# Patient Record
Sex: Male | Born: 1955 | Race: Black or African American | Hispanic: No | Marital: Single | Smoking: Never smoker
Health system: Southern US, Community
[De-identification: ages and names within clinical notes are randomized; demographics above are authoritative.]

## PROBLEM LIST (undated history)

## (undated) DIAGNOSIS — M1990 Unspecified osteoarthritis, unspecified site: Secondary | ICD-10-CM

## (undated) DIAGNOSIS — I10 Essential (primary) hypertension: Secondary | ICD-10-CM

## (undated) HISTORY — PX: JOINT REPLACEMENT: SHX530

---

## 2017-10-31 ENCOUNTER — Inpatient Hospital Stay: Payer: Medicare Other

## 2017-10-31 ENCOUNTER — Emergency Department: Payer: Medicare Other | Admitting: Anesthesiology

## 2017-10-31 ENCOUNTER — Encounter: Admission: EM | Disposition: E | Payer: Self-pay | Source: Home / Self Care | Attending: Internal Medicine

## 2017-10-31 ENCOUNTER — Inpatient Hospital Stay
Admission: EM | Admit: 2017-10-31 | Discharge: 2017-11-24 | DRG: 915 | Disposition: E | Payer: Medicare Other | Attending: Internal Medicine | Admitting: Internal Medicine

## 2017-10-31 ENCOUNTER — Other Ambulatory Visit: Payer: Self-pay

## 2017-10-31 DIAGNOSIS — E872 Acidosis: Secondary | ICD-10-CM | POA: Diagnosis not present

## 2017-10-31 DIAGNOSIS — N281 Cyst of kidney, acquired: Secondary | ICD-10-CM | POA: Diagnosis present

## 2017-10-31 DIAGNOSIS — I10 Essential (primary) hypertension: Secondary | ICD-10-CM | POA: Diagnosis present

## 2017-10-31 DIAGNOSIS — R34 Anuria and oliguria: Secondary | ICD-10-CM | POA: Diagnosis not present

## 2017-10-31 DIAGNOSIS — G9340 Encephalopathy, unspecified: Secondary | ICD-10-CM | POA: Diagnosis not present

## 2017-10-31 DIAGNOSIS — X58XXXA Exposure to other specified factors, initial encounter: Secondary | ICD-10-CM | POA: Diagnosis present

## 2017-10-31 DIAGNOSIS — R6521 Severe sepsis with septic shock: Secondary | ICD-10-CM | POA: Diagnosis not present

## 2017-10-31 DIAGNOSIS — J948 Other specified pleural conditions: Secondary | ICD-10-CM

## 2017-10-31 DIAGNOSIS — M545 Low back pain: Secondary | ICD-10-CM | POA: Diagnosis not present

## 2017-10-31 DIAGNOSIS — J9600 Acute respiratory failure, unspecified whether with hypoxia or hypercapnia: Secondary | ICD-10-CM | POA: Diagnosis not present

## 2017-10-31 DIAGNOSIS — R4182 Altered mental status, unspecified: Secondary | ICD-10-CM

## 2017-10-31 DIAGNOSIS — R569 Unspecified convulsions: Secondary | ICD-10-CM | POA: Diagnosis not present

## 2017-10-31 DIAGNOSIS — Z4659 Encounter for fitting and adjustment of other gastrointestinal appliance and device: Secondary | ICD-10-CM

## 2017-10-31 DIAGNOSIS — Z66 Do not resuscitate: Secondary | ICD-10-CM | POA: Diagnosis present

## 2017-10-31 DIAGNOSIS — Z960 Presence of urogenital implants: Secondary | ICD-10-CM

## 2017-10-31 DIAGNOSIS — Z888 Allergy status to other drugs, medicaments and biological substances status: Secondary | ICD-10-CM

## 2017-10-31 DIAGNOSIS — E8770 Fluid overload, unspecified: Secondary | ICD-10-CM | POA: Diagnosis not present

## 2017-10-31 DIAGNOSIS — J851 Abscess of lung with pneumonia: Secondary | ICD-10-CM | POA: Diagnosis not present

## 2017-10-31 DIAGNOSIS — J15211 Pneumonia due to Methicillin susceptible Staphylococcus aureus: Secondary | ICD-10-CM | POA: Diagnosis not present

## 2017-10-31 DIAGNOSIS — R402434 Glasgow coma scale score 3-8, 24 hours or more after hospital admission: Secondary | ICD-10-CM | POA: Diagnosis not present

## 2017-10-31 DIAGNOSIS — Z452 Encounter for adjustment and management of vascular access device: Secondary | ICD-10-CM

## 2017-10-31 DIAGNOSIS — E876 Hypokalemia: Secondary | ICD-10-CM | POA: Diagnosis not present

## 2017-10-31 DIAGNOSIS — I639 Cerebral infarction, unspecified: Secondary | ICD-10-CM | POA: Diagnosis not present

## 2017-10-31 DIAGNOSIS — T464X5A Adverse effect of angiotensin-converting-enzyme inhibitors, initial encounter: Secondary | ICD-10-CM | POA: Diagnosis present

## 2017-10-31 DIAGNOSIS — N170 Acute kidney failure with tubular necrosis: Secondary | ICD-10-CM | POA: Diagnosis not present

## 2017-10-31 DIAGNOSIS — J9601 Acute respiratory failure with hypoxia: Secondary | ICD-10-CM | POA: Diagnosis present

## 2017-10-31 DIAGNOSIS — R0603 Acute respiratory distress: Secondary | ICD-10-CM

## 2017-10-31 DIAGNOSIS — J189 Pneumonia, unspecified organism: Secondary | ICD-10-CM | POA: Diagnosis not present

## 2017-10-31 DIAGNOSIS — J154 Pneumonia due to other streptococci: Secondary | ICD-10-CM | POA: Diagnosis not present

## 2017-10-31 DIAGNOSIS — M1990 Unspecified osteoarthritis, unspecified site: Secondary | ICD-10-CM | POA: Diagnosis present

## 2017-10-31 DIAGNOSIS — T783XXA Angioneurotic edema, initial encounter: Principal | ICD-10-CM | POA: Diagnosis present

## 2017-10-31 DIAGNOSIS — A419 Sepsis, unspecified organism: Secondary | ICD-10-CM | POA: Diagnosis not present

## 2017-10-31 DIAGNOSIS — R1110 Vomiting, unspecified: Secondary | ICD-10-CM | POA: Diagnosis not present

## 2017-10-31 DIAGNOSIS — J1520 Pneumonia due to staphylococcus, unspecified: Secondary | ICD-10-CM | POA: Diagnosis not present

## 2017-10-31 DIAGNOSIS — E870 Hyperosmolality and hypernatremia: Secondary | ICD-10-CM | POA: Diagnosis not present

## 2017-10-31 DIAGNOSIS — Z0189 Encounter for other specified special examinations: Secondary | ICD-10-CM

## 2017-10-31 DIAGNOSIS — J90 Pleural effusion, not elsewhere classified: Secondary | ICD-10-CM | POA: Diagnosis not present

## 2017-10-31 DIAGNOSIS — Z7189 Other specified counseling: Secondary | ICD-10-CM | POA: Diagnosis not present

## 2017-10-31 DIAGNOSIS — Z9911 Dependence on respirator [ventilator] status: Secondary | ICD-10-CM

## 2017-10-31 DIAGNOSIS — Z01818 Encounter for other preprocedural examination: Secondary | ICD-10-CM

## 2017-10-31 DIAGNOSIS — N179 Acute kidney failure, unspecified: Secondary | ICD-10-CM | POA: Diagnosis not present

## 2017-10-31 DIAGNOSIS — Z978 Presence of other specified devices: Secondary | ICD-10-CM

## 2017-10-31 DIAGNOSIS — Z515 Encounter for palliative care: Secondary | ICD-10-CM | POA: Diagnosis present

## 2017-10-31 DIAGNOSIS — J9690 Respiratory failure, unspecified, unspecified whether with hypoxia or hypercapnia: Secondary | ICD-10-CM

## 2017-10-31 DIAGNOSIS — I471 Supraventricular tachycardia: Secondary | ICD-10-CM | POA: Diagnosis not present

## 2017-10-31 DIAGNOSIS — R1310 Dysphagia, unspecified: Secondary | ICD-10-CM | POA: Diagnosis not present

## 2017-10-31 DIAGNOSIS — K769 Liver disease, unspecified: Secondary | ICD-10-CM | POA: Diagnosis present

## 2017-10-31 DIAGNOSIS — I6340 Cerebral infarction due to embolism of unspecified cerebral artery: Secondary | ICD-10-CM | POA: Diagnosis not present

## 2017-10-31 DIAGNOSIS — F10231 Alcohol dependence with withdrawal delirium: Secondary | ICD-10-CM | POA: Diagnosis not present

## 2017-10-31 DIAGNOSIS — D649 Anemia, unspecified: Secondary | ICD-10-CM | POA: Diagnosis not present

## 2017-10-31 DIAGNOSIS — K219 Gastro-esophageal reflux disease without esophagitis: Secondary | ICD-10-CM | POA: Diagnosis present

## 2017-10-31 DIAGNOSIS — Z1389 Encounter for screening for other disorder: Secondary | ICD-10-CM

## 2017-10-31 DIAGNOSIS — R7989 Other specified abnormal findings of blood chemistry: Secondary | ICD-10-CM | POA: Diagnosis not present

## 2017-10-31 DIAGNOSIS — R739 Hyperglycemia, unspecified: Secondary | ICD-10-CM | POA: Diagnosis not present

## 2017-10-31 DIAGNOSIS — E878 Other disorders of electrolyte and fluid balance, not elsewhere classified: Secondary | ICD-10-CM | POA: Diagnosis not present

## 2017-10-31 DIAGNOSIS — J869 Pyothorax without fistula: Secondary | ICD-10-CM | POA: Diagnosis not present

## 2017-10-31 DIAGNOSIS — T783XXD Angioneurotic edema, subsequent encounter: Secondary | ICD-10-CM | POA: Diagnosis not present

## 2017-10-31 DIAGNOSIS — D696 Thrombocytopenia, unspecified: Secondary | ICD-10-CM | POA: Diagnosis not present

## 2017-10-31 HISTORY — PX: INTUBATION-ENDOTRACHEAL WITH TRACHEOSTOMY STANDBY: SHX6592

## 2017-10-31 HISTORY — DX: Essential (primary) hypertension: I10

## 2017-10-31 HISTORY — DX: Unspecified osteoarthritis, unspecified site: M19.90

## 2017-10-31 LAB — CBC
HCT: 41.4 % (ref 40.0–52.0)
Hemoglobin: 14.6 g/dL (ref 13.0–18.0)
MCH: 32.4 pg (ref 26.0–34.0)
MCHC: 35.2 g/dL (ref 32.0–36.0)
MCV: 91.9 fL (ref 80.0–100.0)
PLATELETS: 216 10*3/uL (ref 150–440)
RBC: 4.5 MIL/uL (ref 4.40–5.90)
RDW: 13.3 % (ref 11.5–14.5)
WBC: 5.7 10*3/uL (ref 3.8–10.6)

## 2017-10-31 LAB — COMPREHENSIVE METABOLIC PANEL
ALK PHOS: 69 U/L (ref 38–126)
ALT: 37 U/L (ref 17–63)
AST: 45 U/L — AB (ref 15–41)
Albumin: 4.7 g/dL (ref 3.5–5.0)
Anion gap: 14 (ref 5–15)
BUN: 15 mg/dL (ref 6–20)
CALCIUM: 9.3 mg/dL (ref 8.9–10.3)
CHLORIDE: 98 mmol/L — AB (ref 101–111)
CO2: 26 mmol/L (ref 22–32)
CREATININE: 0.99 mg/dL (ref 0.61–1.24)
Glucose, Bld: 85 mg/dL (ref 65–99)
Potassium: 2.7 mmol/L — CL (ref 3.5–5.1)
Sodium: 138 mmol/L (ref 135–145)
Total Bilirubin: 0.9 mg/dL (ref 0.3–1.2)
Total Protein: 8.4 g/dL — ABNORMAL HIGH (ref 6.5–8.1)

## 2017-10-31 LAB — GLUCOSE, CAPILLARY: Glucose-Capillary: 102 mg/dL — ABNORMAL HIGH (ref 65–99)

## 2017-10-31 LAB — MAGNESIUM: MAGNESIUM: 1.7 mg/dL (ref 1.7–2.4)

## 2017-10-31 LAB — PHOSPHORUS: Phosphorus: 3.1 mg/dL (ref 2.5–4.6)

## 2017-10-31 SURGERY — INTUBATION-ENDOTRACHEAL WITH TRACHEOSTOMY STANDBY
Anesthesia: Monitor Anesthesia Care | Site: Nose | Wound class: Clean Contaminated

## 2017-10-31 MED ORDER — ACETAMINOPHEN 325 MG PO TABS: 650.0000 mg | ORAL_TABLET | ORAL | Status: DC | PRN

## 2017-10-31 MED ORDER — OXYMETAZOLINE HCL 0.05 % NA SOLN
NASAL | Status: AC
  Filled 2017-10-31: qty 15

## 2017-10-31 MED ORDER — MIDAZOLAM HCL 2 MG/2ML IJ SOLN
2.0000 mg | INTRAMUSCULAR | Status: DC | PRN
  Administered 2017-11-01 (×2): 4 mg via INTRAVENOUS
  Filled 2017-10-31 (×2): qty 4
  Filled 2017-10-31: qty 2

## 2017-10-31 MED ORDER — DEXAMETHASONE SODIUM PHOSPHATE 4 MG/ML IJ SOLN
4.0000 mg | Freq: Four times a day (QID) | INTRAMUSCULAR | Status: DC
  Administered 2017-11-01 (×2): 4 mg via INTRAVENOUS
  Filled 2017-10-31 (×2): qty 1

## 2017-10-31 MED ORDER — VECURONIUM BROMIDE 10 MG IV SOLR
10.0000 mg | INTRAVENOUS | Status: DC | PRN
  Administered 2017-10-31 – 2017-11-01 (×3): 10 mg via INTRAVENOUS
  Filled 2017-10-31 (×2): qty 10

## 2017-10-31 MED ORDER — EPINEPHRINE 0.3 MG/0.3ML IJ SOAJ
INTRAMUSCULAR | Status: AC
  Administered 2017-10-31: 0.3 mg
  Filled 2017-10-31: qty 0.3

## 2017-10-31 MED ORDER — MIDAZOLAM HCL 2 MG/2ML IJ SOLN
INTRAMUSCULAR | Status: AC
  Filled 2017-10-31: qty 2

## 2017-10-31 MED ORDER — METHYLPREDNISOLONE SODIUM SUCC 125 MG IJ SOLR
INTRAMUSCULAR | Status: AC
  Administered 2017-10-31: 125 mg
  Filled 2017-10-31: qty 2

## 2017-10-31 MED ORDER — DEXMEDETOMIDINE HCL IN NACL 200 MCG/50ML IV SOLN
INTRAVENOUS | Status: AC
  Filled 2017-10-31: qty 50

## 2017-10-31 MED ORDER — HYDRALAZINE HCL 20 MG/ML IJ SOLN
10.0000 mg | INTRAMUSCULAR | Status: DC | PRN
  Administered 2017-11-01 (×3): 20 mg via INTRAVENOUS
  Administered 2017-11-03: 10 mg via INTRAVENOUS
  Administered 2017-11-04 (×2): 20 mg via INTRAVENOUS
  Administered 2017-11-04 – 2017-11-05 (×5): 40 mg via INTRAVENOUS
  Administered 2017-11-07: 20 mg via INTRAVENOUS
  Administered 2017-11-07: 40 mg via INTRAVENOUS
  Filled 2017-10-31 (×2): qty 2
  Filled 2017-10-31: qty 1
  Filled 2017-10-31: qty 2
  Filled 2017-10-31: qty 1
  Filled 2017-10-31 (×3): qty 2
  Filled 2017-10-31 (×2): qty 1
  Filled 2017-10-31: qty 2
  Filled 2017-10-31 (×2): qty 1

## 2017-10-31 MED ORDER — FAMOTIDINE IN NACL 20-0.9 MG/50ML-% IV SOLN
20.0000 mg | Freq: Two times a day (BID) | INTRAVENOUS | Status: DC
  Administered 2017-11-01: 20 mg via INTRAVENOUS
  Filled 2017-10-31: qty 50

## 2017-10-31 MED ORDER — MIDAZOLAM HCL 2 MG/2ML IJ SOLN
2.0000 mg | INTRAMUSCULAR | Status: DC | PRN
  Filled 2017-10-31: qty 2

## 2017-10-31 MED ORDER — SODIUM CHLORIDE 0.9 % IV SOLN
Freq: Once | INTRAVENOUS | Status: AC
  Administered 2017-10-31: 22:00:00 via INTRAVENOUS

## 2017-10-31 MED ORDER — FAMOTIDINE IN NACL 20-0.9 MG/50ML-% IV SOLN
INTRAVENOUS | Status: AC
  Administered 2017-10-31: 20 mg
  Filled 2017-10-31: qty 50

## 2017-10-31 MED ORDER — CHLORHEXIDINE GLUCONATE 0.12% ORAL RINSE (MEDLINE KIT)
15.0000 mL | Freq: Two times a day (BID) | OROMUCOSAL | Status: DC
  Administered 2017-11-01 – 2017-11-16 (×30): 15 mL via OROMUCOSAL

## 2017-10-31 MED ORDER — DIPHENHYDRAMINE HCL 50 MG/ML IJ SOLN
INTRAMUSCULAR | Status: AC
  Administered 2017-10-31: 50 mg
  Filled 2017-10-31: qty 1

## 2017-10-31 MED ORDER — PROPOFOL 10 MG/ML IV BOLUS
INTRAVENOUS | Status: DC | PRN
  Administered 2017-10-31: 100 mg via INTRAVENOUS

## 2017-10-31 MED ORDER — LIDOCAINE HCL (PF) 4 % IJ SOLN
INTRAMUSCULAR | Status: DC | PRN
  Administered 2017-10-31: 4 mL via RESPIRATORY_TRACT

## 2017-10-31 MED ORDER — DEXMEDETOMIDINE HCL IN NACL 400 MCG/100ML IV SOLN
INTRAVENOUS | Status: DC | PRN
  Administered 2017-10-31: .4 ug/kg/h via INTRAVENOUS

## 2017-10-31 MED ORDER — BISACODYL 10 MG RE SUPP: 10.0000 mg | Freq: Every day | RECTAL | Status: DC | PRN

## 2017-10-31 MED ORDER — MIDAZOLAM HCL 2 MG/2ML IJ SOLN
2.0000 mg | INTRAMUSCULAR | Status: DC | PRN
  Administered 2017-10-31: 2 mg via INTRAVENOUS

## 2017-10-31 MED ORDER — MIDAZOLAM HCL 2 MG/2ML IJ SOLN
INTRAMUSCULAR | Status: DC | PRN
  Administered 2017-10-31 (×2): 2 mg via INTRAVENOUS

## 2017-10-31 MED ORDER — ROCURONIUM BROMIDE 50 MG/5ML IV SOLN: 1.0000 mg/kg | Freq: Once | INTRAVENOUS | Status: DC

## 2017-10-31 MED ORDER — DIPHENHYDRAMINE HCL 50 MG/ML IJ SOLN
25.0000 mg | Freq: Four times a day (QID) | INTRAMUSCULAR | Status: DC | PRN
  Administered 2017-11-01: 25 mg via INTRAVENOUS
  Filled 2017-10-31: qty 1

## 2017-10-31 MED ORDER — GLYCOPYRROLATE 0.2 MG/ML IJ SOLN
INTRAMUSCULAR | Status: DC | PRN
  Administered 2017-10-31: 0.4 mg via INTRAVENOUS

## 2017-10-31 MED ORDER — OXYMETAZOLINE HCL 0.05 % NA SOLN
2.0000 | Freq: Once | NASAL | Status: AC
  Administered 2017-10-31: 2 via NASAL

## 2017-10-31 MED ORDER — ENOXAPARIN SODIUM 40 MG/0.4ML ~~LOC~~ SOLN: 40.0000 mg | SUBCUTANEOUS | Status: DC

## 2017-10-31 MED ORDER — ONDANSETRON HCL 4 MG/2ML IJ SOLN
4.0000 mg | Freq: Four times a day (QID) | INTRAMUSCULAR | Status: DC | PRN
  Administered 2017-11-06: 4 mg via INTRAVENOUS
  Filled 2017-10-31: qty 2

## 2017-10-31 MED ORDER — ASPIRIN 300 MG RE SUPP: 300.0000 mg | RECTAL | Status: AC

## 2017-10-31 MED ORDER — ETOMIDATE 2 MG/ML IV SOLN: 0.3000 mg/kg | Freq: Once | INTRAVENOUS | Status: DC

## 2017-10-31 MED ORDER — ORAL CARE MOUTH RINSE: 15.0000 mL | Freq: Four times a day (QID) | OROMUCOSAL | Status: DC

## 2017-10-31 MED ORDER — LIDOCAINE-EPINEPHRINE 1 %-1:100000 IJ SOLN
INTRAMUSCULAR | Status: AC
  Filled 2017-10-31: qty 1

## 2017-10-31 MED ORDER — LACTATED RINGERS IV SOLN
INTRAVENOUS | Status: DC | PRN
  Administered 2017-10-31: 23:00:00 via INTRAVENOUS

## 2017-10-31 MED ORDER — SENNOSIDES 8.8 MG/5ML PO SYRP
5.0000 mL | ORAL_SOLUTION | Freq: Two times a day (BID) | ORAL | Status: DC | PRN
  Administered 2017-11-06: 5 mL
  Filled 2017-10-31 (×2): qty 5

## 2017-10-31 MED ORDER — ASPIRIN 81 MG PO CHEW: 324.0000 mg | CHEWABLE_TABLET | ORAL | Status: AC

## 2017-10-31 MED ORDER — ROCURONIUM BROMIDE 100 MG/10ML IV SOLN
INTRAVENOUS | Status: DC | PRN
  Administered 2017-10-31: 50 mg via INTRAVENOUS

## 2017-10-31 MED ORDER — PROPOFOL 1000 MG/100ML IV EMUL
5.0000 ug/kg/min | INTRAVENOUS | Status: DC
  Administered 2017-10-31: 30 ug/kg/min via INTRAVENOUS
  Administered 2017-11-01 (×3): 50 ug/kg/min via INTRAVENOUS
  Filled 2017-10-31 (×6): qty 100

## 2017-10-31 MED ORDER — FAMOTIDINE IN NACL 20-0.9 MG/50ML-% IV SOLN: 20.0000 mg | Freq: Two times a day (BID) | INTRAVENOUS | Status: DC

## 2017-10-31 MED ORDER — SODIUM CHLORIDE 0.9 % IV SOLN: 250.0000 mL | INTRAVENOUS | Status: DC | PRN

## 2017-10-31 MED ORDER — DEXMEDETOMIDINE HCL 200 MCG/2ML IV SOLN
INTRAVENOUS | Status: DC | PRN
  Administered 2017-10-31: 8 ug via INTRAVENOUS
  Administered 2017-10-31: 12 ug via INTRAVENOUS

## 2017-10-31 MED ORDER — MIDAZOLAM HCL 2 MG/2ML IJ SOLN
INTRAMUSCULAR | Status: AC
Start: 2017-10-31 — End: ?
  Filled 2017-10-31: qty 2

## 2017-10-31 MED ORDER — IPRATROPIUM-ALBUTEROL 0.5-2.5 (3) MG/3ML IN SOLN
3.0000 mL | Freq: Four times a day (QID) | RESPIRATORY_TRACT | Status: DC
  Administered 2017-11-01 (×2): 3 mL via RESPIRATORY_TRACT
  Filled 2017-10-31 (×2): qty 3

## 2017-10-31 MED ORDER — MIDAZOLAM HCL 2 MG/2ML IJ SOLN
2.0000 mg | INTRAMUSCULAR | Status: DC | PRN
  Administered 2017-11-01: 4 mg via INTRAVENOUS
  Administered 2017-11-01: 2 mg via INTRAVENOUS
  Filled 2017-10-31 (×2): qty 4

## 2017-10-31 SURGICAL SUPPLY — 12 items
BLADE SURG 15 STRL LF DISP TIS (BLADE) ×1 IMPLANT
BLADE SURG 15 STRL SS (BLADE) ×2
BLADE SURG SZ11 CARB STEEL (BLADE) ×3 IMPLANT
CANISTER SUCT 1200ML W/VALVE (MISCELLANEOUS) ×3 IMPLANT
ELECT REM PT RETURN 9FT ADLT (ELECTROSURGICAL) ×3
ELECTRODE REM PT RTRN 9FT ADLT (ELECTROSURGICAL) ×1 IMPLANT
GLOVE BIO SURGEON STRL SZ7.5 (GLOVE) ×3 IMPLANT
GOWN STRL REUS W/ TWL LRG LVL3 (GOWN DISPOSABLE) ×2 IMPLANT
GOWN STRL REUS W/TWL LRG LVL3 (GOWN DISPOSABLE) ×4
KIT TURNOVER KIT A (KITS) ×3 IMPLANT
PACK HEAD/NECK (MISCELLANEOUS) ×3 IMPLANT
SPONGE DRAIN TRACH 4X4 STRL 2S (GAUZE/BANDAGES/DRESSINGS) ×3 IMPLANT

## 2017-10-31 NOTE — ED Notes (Signed)
First nurse to call family member Dewayne Hatchnn to notify her of possibility of intubation to save airway.

## 2017-10-31 NOTE — ED Notes (Signed)
Pt place don 4 L of O2. 

## 2017-10-31 NOTE — Consult Note (Signed)
PULMONARY / CRITICAL CARE MEDICINE   Name: Dustin Orozco MRN: [id number] DOB: 1955-08-24    ADMISSION DATE:  11-07-2017   CONSULTATION DATE:  07-Nov-2017  REFERRING MD:  Dr. Anne Hahn  Reason: Acute respiratory failure secondary to angioedema  HISTORY OF PRESENT ILLNESS:   This is a 62 year old African-American male admitted with acute angioedema secondary to lisinopril.  She is obtained from the ED records as patient is currently intubated and sedated.  Swelling started today and gradually got worse as the course of the day.  Upon ED presentation position stone was swollen and obstructing his airway hence he was taken to the OR for an emergent intubation.  He was seen by ENT and nasally intubated.  Patient had been taking lisinopril for years.  PAST MEDICAL HISTORY :  He  has a past medical history of Arthritis and Hypertension.  PAST SURGICAL HISTORY: He  has a past surgical history that includes Joint replacement.  Allergies  Allergen Reactions  . Lisinopril     Tongue swelling    No current facility-administered medications on file prior to encounter.    No current outpatient medications on file prior to encounter.    FAMILY HISTORY:  His has no family status information on file.    SOCIAL HISTORY: He  reports that he has never smoked. He has never used smokeless tobacco. He reports that he drinks alcohol. He reports that he has current or past drug history.  REVIEW OF SYSTEMS:   Unable to obtain as patient is currently sedated and intubated  SUBJECTIVE:   VITAL SIGNS: BP (!) 165/109   Pulse 73   Temp 97.7 F (36.5 C) (Oral)   Resp 17   Ht 6\' 1"  (1.854 m)   Wt 198 lb (89.8 kg)   SpO2 100%   BMI 26.12 kg/m   HEMODYNAMICS:    VENTILATOR SETTINGS:    INTAKE / OUTPUT: No intake/output data recorded.  PHYSICAL EXAMINATION: General: Sedated Neuro: Withdraws to pain HEENT: PERRLA, nasotracheal tube in place, tongue is swollen and protruding out of the  mouth Cardiovascular: Apical pulse regular, S1-S2, no murmur regurg or gallop, +2 pulses bilaterally, no edema Lungs:  CTAB Abdomen: Nondistended, normal bowel sounds in all 4 quadrants, palpation reveals no organomegaly Musculoskeletal: Positive range of motion, no deformities Skin: Warm and dry  LABS:  BMET No results for input(s): NA, K, CL, CO2, BUN, CREATININE, GLUCOSE in the last 168 hours.  Electrolytes No results for input(s): CALCIUM, MG, PHOS in the last 168 hours.  CBC No results for input(s): WBC, HGB, HCT, PLT in the last 168 hours.  Coag's No results for input(s): APTT, INR in the last 168 hours.  Sepsis Markers No results for input(s): LATICACIDVEN, PROCALCITON, O2SATVEN in the last 168 hours.  ABG No results for input(s): PHART, PCO2ART, PO2ART in the last 168 hours.  Liver Enzymes No results for input(s): AST, ALT, ALKPHOS, BILITOT, ALBUMIN in the last 168 hours.  Cardiac Enzymes No results for input(s): TROPONINI, PROBNP in the last 168 hours.  Glucose No results for input(s): GLUCAP in the last 168 hours.  Imaging No results found.  SIGNIFICANT EVENTS: 2017-11-07>ADMITTED  LINES/TUBES: PIVs ETT  DISCUSSION: 62 year old male presenting with respiratory failure from airway obstruction secondary to ACE induced angioedema  ASSESSMENT  Acute hypoxic respiratory failure secondary to angioedema Acute airway obstruction secondary to angioedema ACE induced angioedema Hypokalemia History of hypertension  PLAN Hemodynamic monitoring per ICU protocol Full vent support with current settings Chest x-ray and  ABG post intubation reviewed ENT following Versed, as needed vecuronium and propofol for vent sedation  Decadron, Benadryl, and H2 blocker IV fluids Monitor and replace electrolytes GI and DVT prophylaxis  FAMILY  - Updates:   Shaketha Jeon S. [facility] ANP-BC Pulmonary and Critical Care Medicine [facility] Pager [phone] or  [phone]  NB: This document was prepared using Dragon voice recognition software and may include unintentional dictation errors.    11/11/2017, 11:23 PM

## 2017-10-31 NOTE — ED Notes (Signed)
ENT states we need to go to the ER.

## 2017-10-31 NOTE — Op Note (Signed)
11/20/2017  11:01 PM    Dustin Orozco, Dustin Orozco  [id number]   Pre-Op Dx: airway obstruction  Post-op Dx: SAME  Proc: Flexible transnasal fiberoptic bronchoscopy with intubation   Surg:  Davina Pokehapman T Luevenia Mcavoy  Anes:  GOT  EBL:  0  Comp:  None  Findings:  Severe tongue swelling and edema  Procedure: Dustin Orozco was identified in the holding area taken the to the operating room placed in supine position. After coordination with anesthesia each nostril was decongested with Afrin solution. The nares was then sequentially dilated with nasal trumpets and topical viscous lidocaine. With this completed and mild sedation given the patient was placed 45. A #7 nasotracheal tube was placed over the bronchoscope. Bronchoscope was then introduced to the left nostril into the posterior pharynx the larynx could be easily visualized. The scope was then passed through the vocal folds down to the carina. The endotracheal tube was advanced over the bronchoscope into the airway and secured. CO2 was easily obtained there were bilateral breath sounds heard. The patient was then sedated for transport ICU. The tube was secured using tape in a circumferential band.  Dispo:   Good  Plan:  Admit to ICU until angioedema has resolved.  Davina PokeChapman T Yeiden Frenkel  11/11/2017 11:01 PM

## 2017-10-31 NOTE — ED Notes (Deleted)
This

## 2017-10-31 NOTE — ED Provider Notes (Addendum)
[facility] Emergency Department Provider Note   ____________________________________________   I have reviewed the triage vital signs and the nursing notes.   HISTORY  Chief Complaint Allergic Reaction   History limited by: Not Limited   HPI Dustin Orozco is a 62 y.o. male who presents to the emergency department today because of concern for tongue swelling. Patient states that the swelling started today.  He is on lisinopril and has been on it for a number of years.  Since the swelling has started he states that it is continuously and gradually gotten worse.  He does state that he is having associated difficulty with swallowing.  He denies similar symptoms in the past.  Per medical record review patient has a history of HTN  Past Medical History:  Diagnosis Date  . Arthritis   . Hypertension     There are no active problems to display for this patient.   Past Surgical History:  Procedure Laterality Date  . JOINT REPLACEMENT      Prior to Admission medications   Not on File    Allergies Lisinopril  No family history on file.  Social History Social History   Tobacco Use  . Smoking status: Never Smoker  . Smokeless tobacco: Never Used  Substance Use Topics  . Alcohol use: Yes  . 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 ENT: Positive for tongue swelling. Cardiovascular: Denies chest pain. Respiratory: Denies shortness of breath. Gastrointestinal: No abdominal pain.  No nausea, no vomiting.  No diarrhea.   Genitourinary: Negative for dysuria. Musculoskeletal: Negative for back pain. Skin: Negative for rash. Neurological: Negative for headaches, focal weakness or numbness.  ____________________________________________   PHYSICAL EXAM:  VITAL SIGNS: ED Triage Vitals  Enc Vitals Group     BP November 04, 2017 2110 (!) 193/119     Pulse Rate  2017/11/04 2110 86     Resp 2017/11/04 2110 18     Temp 11-04-2017 2110 97.7 F (36.5 C)     Temp Source 2017-11-04 2110 Oral     SpO2 --      Weight Nov 04, 2017 2113 198 lb (89.8 kg)     Height 2017/11/04 2113 6\' 1"  (1.854 m)     Head Circumference --      Peak Flow --      Pain Score 11-04-2017 2113 0   Constitutional: Alert and oriented.  Eyes: Conjunctivae are normal.  ENT   Head: Normocephalic and atraumatic.   Nose: No congestion/rhinnorhea.   Mouth/Throat: Enlarged tongue, primarily the right side. Left side of tongue soft. No lip swelling.    Neck: No stridor. Hematological/Lymphatic/Immunilogical: No cervical lymphadenopathy. Cardiovascular: Normal rate, regular rhythm.  No murmurs, rubs, or gallops.  Respiratory: Normal respiratory effort without tachypnea nor retractions. Breath sounds are clear and equal bilaterally. No wheezes/rales/rhonchi. Gastrointestinal: Soft and non tender. No rebound. No guarding.  Genitourinary: Deferred Musculoskeletal: Normal range of motion in all extremities. Neurologic:  Normal speech and language. No gross focal neurologic deficits are appreciated.  Skin:  Skin is warm, dry and intact. No rash noted. Psychiatric: Mood and affect are normal. Speech and behavior are normal. Patient exhibits appropriate insight and judgment.  ____________________________________________    LABS (pertinent positives/negatives)  None  ____________________________________________   EKG  I, Phineas Semen, attending physician, personally viewed and interpreted this EKG  EKG Time: 2135 Rate: 74 Rhythm: sinus rhythm Axis: left axis  deviation Intervals: qtc 466 QRS: IVCD ST changes: no st elevation Impression: abnormal ekg   ____________________________________________    RADIOLOGY  None  ____________________________________________   PROCEDURES  Procedures  CRITICAL CARE Performed by: Phineas SemenGOODMAN, Santino Kinsella   Total critical care time:  30 minutes  Critical care time was exclusive of separately billable procedures and treating other patients.  Critical care was necessary to treat or prevent imminent or life-threatening deterioration.  Critical care was time spent personally by me on the following activities: development of treatment plan with patient and/or surrogate as well as nursing, discussions with consultants, evaluation of patient's response to treatment, examination of patient, obtaining history from patient or surrogate, ordering and performing treatments and interventions, ordering and review of laboratory studies, ordering and review of radiographic studies, pulse oximetry and re-evaluation of patient's condition.  ____________________________________________   INITIAL IMPRESSION / ASSESSMENT AND PLAN / ED COURSE  Pertinent labs & imaging results that were available during my care of the patient were reviewed by me and considered in my medical decision making (see chart for details).  Patient presented to the emergency department today because of concerns for tongue swelling that started a few hours ago.  On initial exam patient did have some swelling to the right side of his tongue.  Left side of the tongue had remained soft.  Patient does take lisinopril.  Think likely the angioedema is secondary to the lisinopril use.  Patient was initially given Benadryl Pepcid and Solu-Medrol.  However the tongue swelling continued to get worse and started progressing. The patient was then given epi. Intubation equipment was assembled in the event that bedside intubation would be necessary. At this time I contacted Dr. Jenne CampusMcQueen with ENT who evaluated the patient and took the patient to the OR for intubation.   ____________________________________________   FINAL CLINICAL IMPRESSION(S) / ED DIAGNOSES  Final diagnoses:  Angioedema, initial encounter     Note: This dictation was prepared with Dragon dictation. Any  transcriptional errors that result from this process are unintentional     Phineas SemenGoodman, Angelica Wix, MD 11/20/2017 [id number]    Phineas SemenGoodman, Karmin Kasprzak, MD 11/09/17 [phone]

## 2017-10-31 NOTE — Anesthesia Post-op Follow-up Note (Signed)
Anesthesia QCDR form completed.        

## 2017-10-31 NOTE — Anesthesia Procedure Notes (Signed)
Procedure Name: Intubation Date/Time: 11/04/2017 10:47 PM Performed by: Rosaria FerriesPiscitello, Joseph K, MD Pre-anesthesia Checklist: Patient identified, Patient being monitored, Timeout performed, Emergency Drugs available and Suction available Patient Re-evaluated:Patient Re-evaluated prior to induction Oxygen Delivery Method: Simple face mask Preoxygenation: Pre-oxygenation with 100% oxygen Induction Type: IV induction Nasal Tubes: Left and Nasal Rae Tube size: 7.0 mm Number of attempts: 1 Airway Equipment and Method: Fiberoptic brochoscope Placement Confirmation: ETT inserted through vocal cords under direct vision,  positive ETCO2 and breath sounds checked- equal and bilateral Secured at: 23 cm Tube secured with: Tape Dental Injury: Teeth and Oropharynx as per pre-operative assessment

## 2017-10-31 NOTE — Anesthesia Preprocedure Evaluation (Signed)
Anesthesia Evaluation  Patient identified by MRN, date of birth, ID band Patient awake    Reviewed: Allergy & Precautions, H&P , NPO status , Patient's Chart, lab work & pertinent test results  History of Anesthesia Complications Negative for: history of anesthetic complications  Airway Mallampati: IV  TM Distance: >3 FB Neck ROM: full    Dental  (+) Chipped, Poor Dentition, Missing   Pulmonary neg pulmonary ROS, neg shortness of breath,           Cardiovascular Exercise Tolerance: Good hypertension,      Neuro/Psych negative neurological ROS  negative psych ROS   GI/Hepatic negative GI ROS, Neg liver ROS, neg GERD  ,  Endo/Other  negative endocrine ROS  Renal/GU      Musculoskeletal   Abdominal   Peds  Hematology negative hematology ROS (+)   Anesthesia Other Findings Acute Angioedema   Past Medical History: No date: Arthritis No date: Hypertension  Past Surgical History: No date: JOINT REPLACEMENT  BMI    Body Mass Index:  26.12 kg/m      Reproductive/Obstetrics negative OB ROS                             Anesthesia Physical Anesthesia Plan  ASA: V and emergent  Anesthesia Plan: MAC   Post-op Pain Management:    Induction: Intravenous  PONV Risk Score and Plan:   Airway Management Planned: Nasal ETT, Fiberoptic Intubation Planned and Awake Intubation Planned  Additional Equipment:   Intra-op Plan:   Post-operative Plan: Post-operative intubation/ventilation  Informed Consent: I have reviewed the patients History and Physical, chart, labs and discussed the procedure including the risks, benefits and alternatives for the proposed anesthesia with the patient or authorized representative who has indicated his/her understanding and acceptance.   Dental Advisory Given  Plan Discussed with: Anesthesiologist, CRNA and Surgeon  Anesthesia Plan Comments: (Parent  consented for risks of anesthesia including but not limited to:  - adverse reactions to medications - damage to teeth, lips, other oral mucosa or nasal mucosa including nose bleeds - sore throat or hoarseness - Damage to heart, brain, lungs or loss of life  Parent voiced understanding.)        Anesthesia Quick Evaluation

## 2017-10-31 NOTE — ED Notes (Signed)
This pt to the ER for tongue swelling to the right side of the tongue. Pt has been taking lisinopril for years and the swelling started suddenly today. Pt is able to breathe and swallow without difficulty. Pt is beginning to drool and asks for a towel. Pt is in good spirits and bale to answer questions. Told pt that to notify this RN if swelling is worse.

## 2017-10-31 NOTE — Consult Note (Signed)
Harvest, Stanco [id number] [birth year] No att. providers found   ED Attending:  Derrill Kay Reason for Consult: Angioedema with severe tongue swelling  HPI: 62 year old gentleman who approximately 5 PM this afternoon noticed some significant swelling of the tongue present to the emergency room with same. Been treated appropriately in the emergency room with the usual medications for angioedema however the tongue swelling continued to worsen. He is on lisinopril and has taken it for many years. I was asked to evaluate for possible intubation.  Allergies:  Allergies  Allergen Reactions  . Lisinopril     Tongue swelling    ROS: Review of systems normal other than 12 systems except per HPI.  PMH:  Past Medical History:  Diagnosis Date  . Arthritis   . Hypertension     FH: History reviewed. No pertinent family history.  SH:  Social History   Socioeconomic History  . Marital status: Single    Spouse name: Not on file  . Number of children: Not on file  . Years of education: Not on file  . Highest education level: Not on file  Occupational History  . Not on file  Social Needs  . Financial resource strain: Not on file  . Food insecurity:    Worry: Not on file    Inability: Not on file  . Transportation needs:    Medical: Not on file    Non-medical: Not on file  Tobacco Use  . Smoking status: Never Smoker  . Smokeless tobacco: Never Used  Substance and Sexual Activity  . Alcohol use: Yes  . 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 Sexual activity: Not on file  Lifestyle  . Physical activity:    Days per week: Not on file    Minutes per session: Not on file  . Stress: Not on file  Relationships  . Social connections:    Talks on phone: Not on file    Gets together: Not on file    Attends religious service: Not on file    Active member of club or organization: Not on file    Attends meetings of clubs or organizations: Not on file    Relationship status: Not  on file  . Intimate partner violence:    Fear of current or ex partner: Not on file    Emotionally abused: Not on file    Physically abused: Not on file    Forced sexual activity: Not on file  Other Topics Concern  . Not on file  Social History Narrative  . Not on file    PSH:  Past Surgical History:  Procedure Laterality Date  . JOINT REPLACEMENT      Physical  Exam: Patient in the emergency room with obvious tongue swelling and slurred speech. The anterior nose was clear palpation the neck was benign  Procedure: Flexible fiberoptic laryngoscopy at the bedside-a topical anesthetic of phenylephrine lidocaine solution was placed within each nostril. As proximal and 5 minutes a flexible scope was passed through the left nares examination of the hypopharynx showed the tongue base to be significantly swollen however the epiglottis vocal folds and subglottic areas were normal.  A/P: Angioedema-I've explained to the patient he is at risk for significant airway obstruction and death I recommend we taken to the operating room for possible flexible bronchoscopic fiberoptic intubation or tracheostomy he understands risk and benefits including bleeding infection airway obstruction and death and is eager to proceed   Davina Poke  11/21/2017 10:56 PM

## 2017-10-31 NOTE — ED Notes (Signed)
Patient presents to ED with swollen tongue. Takes Lisinopril. Immediately  Taken to room 19

## 2017-10-31 NOTE — ED Notes (Signed)
Family to bedside. Tongue continues to swell. Pt is still able to breathe without difficulty. ENT MD to the bedside.

## 2017-10-31 NOTE — ED Notes (Signed)
MD to bedside. ENT called.

## 2017-10-31 NOTE — H&P (Addendum)
[facility] [place] [facility]   PATIENT NAME: Dustin Orozco    MR#:  [id number]  DATE OF BIRTH:  September 17, 1955  DATE OF ADMISSION:  20-Nov-2017  PRIMARY CARE PHYSICIAN: [facility]   REQUESTING/REFERRING PHYSICIAN: Derrill Kay, MD  CHIEF COMPLAINT:   Chief Complaint  Patient presents with  . Allergic Reaction    HISTORY OF PRESENT ILLNESS:  Dustin Orozco  is a 62 y.o. male who presents with lip and tongue swelling.  Patient stated to ED physician that he has been on lisinopril for a number of years.  His edema progressed rather rapidly and he had to be taken to the OR for nasal intubation by ENT.  Hospitalist were called for admission  PAST MEDICAL HISTORY:   Past Medical History:  Diagnosis Date  . Arthritis   . Hypertension      PAST SURGICAL HISTORY:   Past Surgical History:  Procedure Laterality Date  . JOINT REPLACEMENT       SOCIAL HISTORY:   Social History   Tobacco Use  . Smoking status: Never Smoker  . Smokeless tobacco: Never Used  Substance Use Topics  . Alcohol use: Yes     FAMILY HISTORY:  Family history reviewed and is noncontributory.   DRUG ALLERGIES:   Allergies  Allergen Reactions  . Lisinopril     Tongue swelling    MEDICATIONS AT HOME:   Prior to Admission medications   Not on File    REVIEW OF SYSTEMS:  Review of Systems  Unable to perform ROS: Critical illness     VITAL SIGNS:   Vitals:   11-20-2017 2115 11-20-17 2120 Nov 20, 2017 2130 11/20/17 2136  BP: (!) 193/119  (!) 173/120 (!) 165/109  Pulse: 86 87 73   Resp:    17  Temp:      TempSrc:      SpO2: 96%  100%   Weight:      Height:       Wt Readings from Last 3 Encounters:  2017-11-20 89.8 kg (198 lb)    PHYSICAL EXAMINATION:  Physical Exam  Vitals reviewed. Constitutional: He appears well-developed and well-nourished. No distress.  HENT:  Head: Normocephalic and atraumatic.  Significantly swollen tongue, protruding from  his mouth, angioedema of the lips as well  Eyes: Pupils are equal, round, and reactive to light. Conjunctivae and EOM are normal. No scleral icterus.  Neck: Normal range of motion. Neck supple. No JVD present. No thyromegaly present.  Cardiovascular: Normal rate, regular rhythm and intact distal pulses. Exam reveals no gallop and no friction rub.  No murmur heard. Respiratory: Effort normal and breath sounds normal. No respiratory distress. He has no wheezes. He has no rales.  Patient is nasally intubated and ventilated  GI: Soft. Bowel sounds are normal. He exhibits no distension. There is no tenderness.  Musculoskeletal: Normal range of motion. He exhibits no edema.  No arthritis, no gout  Lymphadenopathy:    He has no cervical adenopathy.  Neurological:  Unable to assess due to sedation and intubation  Skin: Skin is warm and dry. No rash noted. No erythema.  Psychiatric:  Unable to assess due to sedation and intubation    LABORATORY PANEL:   CBC No results for input(s): WBC, HGB, HCT, PLT in the last 168 hours. ------------------------------------------------------------------------------------------------------------------  Chemistries  No results for input(s): NA, K, CL, CO2, GLUCOSE, BUN, CREATININE, CALCIUM, MG, AST, ALT, ALKPHOS, BILITOT in the last 168 hours.  Invalid input(s): GFRCGP ------------------------------------------------------------------------------------------------------------------  Cardiac Enzymes No results for input(s): TROPONINI in the last 168 hours. ------------------------------------------------------------------------------------------------------------------  RADIOLOGY:  No results found.  EKG:   Orders placed or performed during the hospital encounter of 11/01/2017  . EKG 12-Lead  . EKG 12-Lead    IMPRESSION AND PLAN:  Principal Problem:   Angioedema -patient required nasal intubation by ENT in the OR, he was later sedated and admitted  to ICU.  We will consult ICU intensivist for vent management and other recommendations while the patient is critically ill Active Problems:   Acute respiratory failure with hypoxia -due to his angioedema and obstruction of his airway, intubated as above   HTN (hypertension) -monitor closely and use antihypertensives as needed until the patient can be extubated and is able to take p.o.  Chart review performed and case discussed with ED provider. Labs, imaging and/or ECG reviewed by provider and discussed with patient/family. Management plans discussed with the patient and/or family.  DVT PROPHYLAXIS: SubQ lovenox  GI PROPHYLAXIS: H2 blocker  ADMISSION STATUS: Inpatient  CODE STATUS: Full  TOTAL CRITICAL CARE TIME TAKING CARE OF THIS PATIENT: 50 minutes.   Dustin Orozco FIELDING 10/25/2017, 10:58 PM  [facility]  Office  [phone]  CC: Primary care physician; [facility]  Note:  This document was prepared using Conservation officer, historic buildingsDragon voice recognition software and may include unintentional dictation errors.

## 2017-10-31 NOTE — Transfer of Care (Signed)
Immediate Anesthesia Transfer of Care Note  Patient: Dustin Orozco  Procedure(s) Performed: INTUBATION-ENDOTRACHEAL (N/A Nose)  Patient Location: ICU  Anesthesia Type:General  Level of Consciousness: sedated and Patient remains intubated per anesthesia plan  Airway & Oxygen Therapy: Patient remains intubated per anesthesia plan and Patient placed on Ventilator (see vital sign flow sheet for setting)  Post-op Assessment: Post -op Vital signs reviewed and stable  Post vital signs: stable  Last Vitals:  Vitals Value Taken Time  BP    Temp    Pulse    Resp    SpO2      Last Pain:  Vitals:   August 11, 2017 2113  TempSrc:   PainSc: 0-No pain         Complications: No apparent anesthesia complications

## 2017-11-01 ENCOUNTER — Inpatient Hospital Stay: Payer: Medicare Other

## 2017-11-01 ENCOUNTER — Encounter: Payer: Self-pay | Admitting: Unknown Physician Specialty

## 2017-11-01 LAB — BLOOD GAS, ARTERIAL
Acid-base deficit: 0.4 mmol/L (ref 0.0–2.0)
Bicarbonate: 28.3 mmol/L — ABNORMAL HIGH (ref 20.0–28.0)
FIO2: 0.4
LHR: 12 {breaths}/min
O2 Saturation: 96.2 %
PEEP: 5 cmH2O
Patient temperature: 37
VT: 500 mL
pCO2 arterial: 63 mmHg — ABNORMAL HIGH (ref 32.0–48.0)
pH, Arterial: 7.26 — ABNORMAL LOW (ref 7.350–7.450)
pO2, Arterial: 95 mmHg (ref 83.0–108.0)

## 2017-11-01 LAB — URINE DRUG SCREEN, QUALITATIVE (ARMC ONLY)
Amphetamines, Ur Screen: NOT DETECTED
BARBITURATES, UR SCREEN: NOT DETECTED
BENZODIAZEPINE, UR SCRN: POSITIVE — AB
Cannabinoid 50 Ng, Ur ~~LOC~~: NOT DETECTED
Cocaine Metabolite,Ur ~~LOC~~: NOT DETECTED
MDMA (Ecstasy)Ur Screen: NOT DETECTED
METHADONE SCREEN, URINE: NOT DETECTED
Opiate, Ur Screen: NOT DETECTED
Phencyclidine (PCP) Ur S: NOT DETECTED
Tricyclic, Ur Screen: NOT DETECTED

## 2017-11-01 LAB — BASIC METABOLIC PANEL
ANION GAP: 13 (ref 5–15)
Anion gap: 13 (ref 5–15)
BUN: 12 mg/dL (ref 6–20)
BUN: 27 mg/dL — ABNORMAL HIGH (ref 6–20)
CALCIUM: 8.3 mg/dL — AB (ref 8.9–10.3)
CALCIUM: 8.9 mg/dL (ref 8.9–10.3)
CO2: 26 mmol/L (ref 22–32)
CO2: 25 mmol/L (ref 22–32)
CREATININE: 0.81 mg/dL (ref 0.61–1.24)
CREATININE: 2.74 mg/dL — AB (ref 0.61–1.24)
Chloride: 101 mmol/L (ref 101–111)
Chloride: 101 mmol/L (ref 101–111)
GFR, EST AFRICAN AMERICAN: 27 mL/min — AB (ref >60)
GFR, EST NON AFRICAN AMERICAN: 23 mL/min — AB (ref >60)
Glucose, Bld: 117 mg/dL — ABNORMAL HIGH (ref 65–99)
Glucose, Bld: 144 mg/dL — ABNORMAL HIGH (ref 65–99)
Potassium: 2.3 mmol/L — CL (ref 3.5–5.1)
Potassium: 3.4 mmol/L — ABNORMAL LOW (ref 3.5–5.1)
SODIUM: 140 mmol/L (ref 135–145)
Sodium: 139 mmol/L (ref 135–145)

## 2017-11-01 LAB — PROTIME-INR
INR: 0.99
Prothrombin Time: 13 seconds (ref 11.4–15.2)

## 2017-11-01 LAB — TRIGLYCERIDES: Triglycerides: 187 mg/dL — ABNORMAL HIGH (ref <150)

## 2017-11-01 LAB — MRSA PCR SCREENING: MRSA by PCR: NEGATIVE

## 2017-11-01 LAB — TROPONIN I: Troponin I: 0.06 ng/mL (ref <0.03)

## 2017-11-01 LAB — PROCALCITONIN

## 2017-11-01 MED ORDER — CLONIDINE HCL 0.1 MG/24HR TD PTWK
0.1000 mg | MEDICATED_PATCH | TRANSDERMAL | Status: DC
  Administered 2017-11-01: 0.1 mg via TRANSDERMAL
  Filled 2017-11-01: qty 1

## 2017-11-01 MED ORDER — DEXAMETHASONE SODIUM PHOSPHATE 4 MG/ML IJ SOLN
4.0000 mg | Freq: Four times a day (QID) | INTRAMUSCULAR | Status: DC
  Administered 2017-11-01 – 2017-11-03 (×7): 4 mg via INTRAVENOUS
  Filled 2017-11-01 (×7): qty 1

## 2017-11-01 MED ORDER — MAGNESIUM SULFATE 2 GM/50ML IV SOLN
2.0000 g | Freq: Once | INTRAVENOUS | Status: AC
  Administered 2017-11-01: 2 g via INTRAVENOUS
  Filled 2017-11-01: qty 50

## 2017-11-01 MED ORDER — PROPOFOL 1000 MG/100ML IV EMUL
INTRAVENOUS | Status: AC
  Administered 2017-10-31: 30 ug/kg/min via INTRAVENOUS
  Filled 2017-11-01: qty 100

## 2017-11-01 MED ORDER — ORAL CARE MOUTH RINSE
15.0000 mL | OROMUCOSAL | Status: DC
  Administered 2017-11-01 – 2017-11-04 (×37): 15 mL via OROMUCOSAL

## 2017-11-01 MED ORDER — VECURONIUM BROMIDE 10 MG IV SOLR
INTRAVENOUS | Status: AC
  Filled 2017-11-01: qty 10

## 2017-11-01 MED ORDER — FENTANYL 2500MCG IN NS 250ML (10MCG/ML) PREMIX INFUSION
0.0000 ug/h | INTRAVENOUS | Status: DC
  Administered 2017-11-01: 200 ug/h via INTRAVENOUS
  Administered 2017-11-02: 300 ug/h via INTRAVENOUS
  Administered 2017-11-02 (×2): 350 ug/h via INTRAVENOUS
  Administered 2017-11-02: 300 ug/h via INTRAVENOUS
  Administered 2017-11-03: 400 ug/h via INTRAVENOUS
  Filled 2017-11-01 (×5): qty 250

## 2017-11-01 MED ORDER — FENTANYL 2500MCG IN NS 250ML (10MCG/ML) PREMIX INFUSION
INTRAVENOUS | Status: AC
  Administered 2017-11-01: 200 ug/h via INTRAVENOUS
  Filled 2017-11-01: qty 250

## 2017-11-01 MED ORDER — FENTANYL CITRATE (PF) 100 MCG/2ML IJ SOLN
25.0000 ug | INTRAMUSCULAR | Status: DC | PRN
  Administered 2017-11-01 (×3): 50 ug via INTRAVENOUS
  Filled 2017-11-01 (×3): qty 2

## 2017-11-01 MED ORDER — POTASSIUM CHLORIDE 2 MEQ/ML IV SOLN
INTRAVENOUS | Status: DC
  Administered 2017-11-01: 11:00:00 via INTRAVENOUS
  Filled 2017-11-01 (×4): qty 1000

## 2017-11-01 MED ORDER — LORAZEPAM 2 MG/ML IJ SOLN
1.0000 mg | INTRAMUSCULAR | Status: DC | PRN
  Administered 2017-11-01: 2 mg via INTRAVENOUS
  Filled 2017-11-01: qty 1

## 2017-11-01 MED ORDER — ENOXAPARIN SODIUM 40 MG/0.4ML ~~LOC~~ SOLN
40.0000 mg | SUBCUTANEOUS | Status: DC
  Administered 2017-11-01: 40 mg via SUBCUTANEOUS
  Filled 2017-11-01: qty 0.4

## 2017-11-01 MED ORDER — POTASSIUM CHLORIDE 10 MEQ/100ML IV SOLN
10.0000 meq | INTRAVENOUS | Status: AC
  Administered 2017-11-01 (×6): 10 meq via INTRAVENOUS
  Filled 2017-11-01 (×6): qty 100

## 2017-11-01 MED ORDER — FAMOTIDINE 20 MG PO TABS: 20.0000 mg | ORAL_TABLET | ORAL | Status: DC

## 2017-11-01 MED ORDER — IPRATROPIUM-ALBUTEROL 0.5-2.5 (3) MG/3ML IN SOLN
3.0000 mL | RESPIRATORY_TRACT | Status: DC | PRN
  Administered 2017-11-04 – 2017-11-08 (×2): 3 mL via RESPIRATORY_TRACT
  Filled 2017-11-01 (×2): qty 3

## 2017-11-01 MED ORDER — FAMOTIDINE IN NACL 20-0.9 MG/50ML-% IV SOLN
20.0000 mg | Freq: Two times a day (BID) | INTRAVENOUS | Status: DC
  Administered 2017-11-01 – 2017-11-02 (×4): 20 mg via INTRAVENOUS
  Filled 2017-11-01 (×5): qty 50

## 2017-11-01 NOTE — Progress Notes (Signed)
[place] at [facility]                                                                                                                                                                                  Patient Demographics   Dustin Orozco, is a 62 y.o. male, DOB - 06/06/1956, CBS:[id number]  Admit date - 11/22/2017   Admitting Physician Lance Coon, MD  Outpatient Primary MD for the patient is [facility] [place]   LOS - 1  Subjective: Patient on the ventilator has significant tongue swelling no significant improvement    Review of Systems:   CONSTITUTIONAL: Remains on the ventilator  Vitals:   Vitals:   11/01/17 1200 11/01/17 1300 11/01/17 1400 11/01/17 1500  BP: 97/72 107/80 101/72 109/79  Pulse: 87 88 83 81  Resp: _0 Temp:  99.7 F (37.6 C)    TempSrc:  Oral    SpO2: 96% 97% 96% 96%  Weight:      Height:        Wt Readings from Last 3 Encounters:  11/01/17 86.8 kg (191 lb 5.8 oz)     Intake/Output Summary (Last 24 hours) at 11/01/2017 1532 Last data filed at 11/01/2017 1324 Gross per 24 hour  Intake 1054.28 ml  Output 2550 ml  Net -1495.72 ml    Physical Exam:   GENERAL: Critically ill HEAD, EYES, EARS, NOSE AND THROAT: Atraumatic, normocephalic.  Significant tongue swelling pupils equal and reactive to light. Sclerae anicteric. No conjunctival injection. No oro-pharyngeal erythema.  NECK: Supple. There is no jugular venous distention. No bruits, no lymphadenopathy, no thyromegaly.  HEART: Regular rate and rhythm,. No murmurs, no rubs, no clicks.  LUNGS: On the vent.  ABDOMEN: Soft, flat, nontender, nondistended. Has good bowel sounds. No hepatosplenomegaly appreciated.  EXTREMITIES: No evidence of any cyanosis, clubbing, or peripheral edema.  +2 pedal and radial pulses bilaterally.  NEUROLOGIC: The patient is alert, awake, and oriented x3 with no focal motor or sensory deficits appreciated bilaterally.  SKIN: Moist and  warm with no rashes appreciated.  Psych: Not anxious, depressed LN: No inguinal LN enlargement    Antibiotics   Anti-infectives (From admission, onward)   None      Medications   Scheduled Meds: . aspirin  324 mg Oral NOW   Or  . aspirin  300 mg Rectal NOW  . chlorhexidine gluconate (MEDLINE KIT)  15 mL Mouth Rinse BID  . cloNIDine  0.1 mg Transdermal Weekly  . dexamethasone  4 mg Intravenous Q6H  . enoxaparin (LOVENOX) injection  40 mg Subcutaneous Q24H  . etomidate  0.3 mg/kg Intravenous Once  . mouth  rinse  15 mL Mouth Rinse 10 times per day  . rocuronium  1 mg/kg Intravenous Once   Continuous Infusions: . famotidine (PEPCID) IV Stopped (11/01/17 1211)  . lactated ringers with kcl 75 mL/hr at 11/01/17 1300  . propofol (DIPRIVAN) infusion 50 mcg/kg/min (11/01/17 1324)   PRN Meds:.acetaminophen, bisacodyl, diphenhydrAMINE, fentaNYL (SUBLIMAZE) injection, hydrALAZINE, ipratropium-albuterol, ondansetron (ZOFRAN) IV, sennosides, vecuronium   Data Review:   Micro Results Recent Results (from the past 240 hour(s))  MRSA PCR Screening     Status: None   Collection Time: 11/15/2017 11:38 PM  Result Value Ref Range Status   MRSA by PCR NEGATIVE NEGATIVE Final    Comment:        The GeneXpert MRSA Assay (FDA approved for NASAL specimens only), is one component of a comprehensive MRSA colonization surveillance program. It is not intended to diagnose MRSA infection nor to guide or monitor treatment for MRSA infections. Performed at [facility], [address] [place], [place] [address]     Radiology Reports Portable Chest Xray  Result Date: 11/01/2017 CLINICAL DATA:  ETT present EXAM: PORTABLE CHEST 1 VIEW COMPARISON:  10/26/2017 FINDINGS: ET tube has been withdrawn slightly, 3.4 cm above carina, satisfactory position. Redemonstration of cardiomegaly with calcified tortuous aorta. No consolidation or edema. IMPRESSION: Satisfactory ETT placement.  No active  chest disease. Electronically Signed   By: Staci Righter M.D.   On: 11/01/2017 07:25   Portable Chest X-ray  Result Date: 11/01/2017 CLINICAL DATA:  Endotracheal tube placement EXAM: PORTABLE CHEST 1 VIEW COMPARISON:  None. FINDINGS: Endotracheal tube tip is low, measuring about 1.8 cm above the carina. Consider retracting about 1 cm. Shallow inspiration. Heart size and pulmonary vascularity are normal for technique. No airspace disease or consolidation in the lungs. No blunting of costophrenic angles. No pneumothorax. IMPRESSION: Endotracheal tube tip measures 1.8 cm above the carina. Shallow inspiration. No active pulmonary disease. Electronically Signed   By: Lucienne Capers M.D.   On: 11/01/2017 00:30   Dg Abd Portable 1v  Result Date: 11/01/2017 CLINICAL DATA:  62 y/o  M; NG tube placement. EXAM: PORTABLE ABDOMEN - 1 VIEW COMPARISON:  None. FINDINGS: Enteric tube tip not visualized in the lower chest or abdomen, readjustment recommended. Clear lung bases. Prominent loops of small bowel in the abdomen may represent obstruction or ileus. IMPRESSION: 1. Enteric tube not visualized, readjustment recommended. 2. Prominent loops of small bowel in the abdomen may represent obstruction or ileus. These results will be called to the ordering clinician or representative by the Radiologist Assistant, and communication documented in the PACS or zVision Dashboard. Electronically Signed   By: Kristine Garbe M.D.   On: 11/01/2017 01:07     CBC Recent Labs  Lab 11/23/2017 2112  WBC 5.7  HGB 14.6  HCT 41.4  PLT 216  MCV 91.9  MCH 32.4  MCHC 35.2  RDW 13.3    Chemistries  Recent Labs  Lab 11/20/2017 2112 11/01/17 0041  NA 138 140  K 2.7* 2.3*  CL 98* 101  CO2 26 26  GLUCOSE 85 117*  BUN 15 12  CREATININE 0.99 0.81  CALCIUM 9.3 8.9  MG 1.7  --   AST 45*  --   ALT 37  --   ALKPHOS 69  --   BILITOT 0.9  --     ------------------------------------------------------------------------------------------------------------------ estimated creatinine clearance is 105.5 mL/min (by C-G formula based on SCr of 0.81 mg/dL). ------------------------------------------------------------------------------------------------------------------ No results for input(s): HGBA1C in the last 72  hours. ------------------------------------------------------------------------------------------------------------------ Recent Labs    11/16/2017 2112  TRIG 187*   ------------------------------------------------------------------------------------------------------------------ No results for input(s): TSH, T4TOTAL, T3FREE, THYROIDAB in the last 72 hours.  Invalid input(s): FREET3 ------------------------------------------------------------------------------------------------------------------ No results for input(s): VITAMINB12, FOLATE, FERRITIN, TIBC, IRON, RETICCTPCT in the last 72 hours.  Coagulation profile Recent Labs  Lab 11/01/17 0041  INR 0.99    No results for input(s): DDIMER in the last 72 hours.  Cardiac Enzymes No results for input(s): CKMB, TROPONINI, MYOGLOBIN in the last 168 hours.  Invalid input(s): CK ------------------------------------------------------------------------------------------------------------------ Invalid input(s): [place]  Patient is a 62 year old African-American male admitted with angioedema and required emergent intubation  #1 angioedema -continue ventilator Start patient on Decadron that will also help with decreasing of the swelling caused by the tongue Continue Pepcid #2  Acute respiratory failure with hypoxia -due to his angioedema and obstruction of his airway, intubated as above #3  HTN (hypertension) -blood pressure stable will need other antihypertensives on discharge besides ACE      Code Status Orders  (From admission, onward)         Start     Ordered   11/02/2017 2322  Full code  Continuous     10/27/2017 2321    Code Status History    This patient has a current code status but no historical code status.           Consults intensive  DVT Prophylaxis  Lovenox   Lab Results  Component Value Date   PLT 216 11/12/2017     Time Spent in minutes   35 minutes  Greater than 50% of time spent in care coordination and counseling patient regarding the condition and plan of care.   Dustin Flock M.D on 11/01/2017 at 3:32 PM  Between 7am to 6pm - Pager - [phone]  After 6pm go to [email or web address] - Proofreader  [employer]   Office  [phone]

## 2017-11-01 NOTE — Progress Notes (Signed)
Initial Nutrition Assessment  DOCUMENTATION CODES:   Not applicable  INTERVENTION:  No enteral access at this time. If patient expected to remain intubated >24-48 hours recommend another attempt at enteral access placement when able.  Once enteral access able to be obtained, recommend initiating Vital High Protein at 40 mL/hr (960 mL goal daily volume) + Pro-Stat 30 mL BID. Provides 1160 kcal, 114 grams of protein, 806 mL H2O daily. With current propofol rate provides 2013 kcal daily.  If tube feeds are initiated provide liquid MVI daily per tube.  NUTRITION DIAGNOSIS:   Inadequate oral intake related to inability to eat as evidenced by NPO status.  GOAL:   Provide needs based on ASPEN/SCCM guidelines  MONITOR:   Vent status, Labs, Weight trends, TF tolerance, I & O's  REASON FOR ASSESSMENT:   Ventilator    ASSESSMENT:   62 year old male with PMHx of HTN, arthritis who is admitted with acute angioedema secondary to lisinopril s/p intubation on 4/7 by ENT.   -Per ENT note today plan is to remain intubated until edema resolves.  Patient intubated and sedated. In SBT at time of RD assessment. Patient is nasally intubated in left nare. Per abdominal x-ray 4/8 possible obstruction vs ileus.  No enteral access at this time. NGT had been placed earlier but was subsequently removed due to malposition.  MAP: 79-89 mmHg; not requiring any pressors  Patient is currently intubated on ventilator support MV: 10 L/min Temp (24hrs), Avg:98.7 F (37.1 C), Min:97.7 F (36.5 C), Max:99.7 F (37.6 C)  Propofol: 32.3 ml/hr (853 kcal)  Medications reviewed and include: famotidine, LR @ 75 mL/hr, propofol gtt.  Labs reviewed: Potassium 2.3.  Patient does not meet criteria for malnutrition.  Discussed with RN.  NUTRITION - FOCUSED PHYSICAL EXAM:    Most Recent Value  Orbital Region  No depletion  Upper Arm Region  No depletion  Thoracic and Lumbar Region  No depletion  Buccal  Region  Unable to assess  Temple Region  No depletion  Clavicle Bone Region  No depletion  Clavicle and Acromion Bone Region  No depletion  Scapular Bone Region  Unable to assess  Patellar Region  No depletion  Anterior Thigh Region  No depletion  Posterior Calf Region  No depletion  Edema (RD Assessment)  -- [non-pitting]  Hair  Reviewed  Eyes  Unable to assess  Mouth  Unable to assess  Skin  Reviewed  Nails  Reviewed     Diet Order:  No diet orders on file  EDUCATION NEEDS:   No education needs have been identified at this time  Skin:  Skin Assessment: Reviewed RN Assessment  Last BM:  Unknown  Height:   Ht Readings from Last 1 Encounters:  11/18/2017 6\' 1"  (1.854 m)    Weight:   Wt Readings from Last 1 Encounters:  11/01/17 191 lb 5.8 oz (86.8 kg)    Ideal Body Weight:  83.6 kg  BMI:  Body mass index is 25.25 kg/m.  Estimated Nutritional Needs:   Kcal:  1963 (PSU 2003b w/ MSJ 1721, Ve 10, Tmax 37.2)  Protein:  104-122 grams (1.2-1.4 grams/kg)  Fluid:  1.8-2 L/day  Dustin RimaLeanne Perlita Forbush, MS, RD, LDN Office: [phone] Pager: [phone] After Hours/Weekend Pager: [phone]

## 2017-11-01 NOTE — Progress Notes (Signed)
Pt seen on rounds this AM and care plan reviewed in detail and modified. Still with severe macroglossia. Will allow him to work in PSV mode. Will extubate when tongue swelling resolved  Dustin Fischeravid Yakelin Grenier, MD PCCM service Mobile [phone] Pager [phone] 11/01/2017 1:29 PM

## 2017-11-01 NOTE — Anesthesia Postprocedure Evaluation (Signed)
Anesthesia Post Note  Patient: Dustin Orozco  Procedure(s) Performed: INTUBATION-ENDOTRACHEAL (N/A Nose)  Patient location during evaluation: ICU Anesthesia Type: MAC Level of consciousness: sedated and patient remains intubated per anesthesia plan Pain management: pain level controlled Vital Signs Assessment: post-procedure vital signs reviewed and stable Respiratory status: patient remains intubated per anesthesia plan Cardiovascular status: stable Postop Assessment: no apparent nausea or vomiting Anesthetic complications: no     Last Vitals:  Vitals:   11/01/17 0700 11/01/17 0800  BP: 108/69 104/66  Pulse: (!) 114 (!) 104  Resp: (!) 25 (!) 23  Temp:  37.2 C  SpO2: 95% 95%    Last Pain:  Vitals:   11/01/17 0800  TempSrc: Oral  PainSc:                  Estill Batten

## 2017-11-01 NOTE — Progress Notes (Signed)
I was called by ICU nurse about EKG changes. EKGs reviewed and showed mild ST changes but not significant ST elevation I have reviewed this case with Dr. Kirke CorinArida Who is on call for STEMI and also agrees this is not significant ST elevations however I will obtain cardiac enzymes for further assessment and will await to place cardiology consult.

## 2017-11-01 NOTE — Progress Notes (Signed)
Report given to Otelia SergeantKelsey Watts, RN. Will relinquish care at this time.

## 2017-11-01 NOTE — Progress Notes (Signed)
Received bedside report from En[place]Brandi, RN from [facility]. Pt.'s VSS, sedated from nasal intubation procedure, nothing running. Ms. Dustin Bankukov, NP alerted of arrival. Will continue to monitor pt. Closely.

## 2017-11-01 NOTE — Progress Notes (Signed)
Tukov NP notified of critical potassium of 2.7. Awaiting new orders.

## 2017-11-01 NOTE — Progress Notes (Signed)
11/01/2017 8:11 AM  Dustin Orozco, Dustin Orozco [id number]  Post-Op Day 1    Temp:  [97.7 F (36.5 C)-98.6 F (37 C)] 98.4 F (36.9 C) (04/08 0400) Pulse Rate:  [73-116] 114 (04/08 0700) Resp:  [12-25] 25 (04/08 0700) BP: (108-193)/(69-120) 108/69 (04/08 0700) SpO2:  [95 %-100 %] 95 % (04/08 0700) FiO2 (%):  [40 %] 40 % (04/08 0743) Weight:  [86.8 kg (191 lb 5.8 oz)-89.8 kg (198 lb)] 86.8 kg (191 lb 5.8 oz) (04/08 0500),     Intake/Output Summary (Last 24 hours) at 11/01/2017 0811 Last data filed at 11/01/2017 0600 Gross per 24 hour  Intake 620.83 ml  Output 2550 ml  Net -1929.17 ml    Results for orders placed or performed during the hospital encounter of 2018-07-13 (from the past 24 hour(s))  Triglycerides     Status: Abnormal   Collection Time: 2018-07-13  9:12 PM  Result Value Ref Range   Triglycerides 187 (H) <150 mg/dL  CBC     Status: None   Collection Time: 2018-07-13  9:12 PM  Result Value Ref Range   WBC 5.7 3.8 - 10.6 K/uL   RBC 4.50 4.40 - 5.90 MIL/uL   Hemoglobin 14.6 13.0 - 18.0 g/dL   HCT 40.941.4 81.140.0 - 91.452.0 %   MCV 91.9 80.0 - 100.0 fL   MCH 32.4 26.0 - 34.0 pg   MCHC 35.2 32.0 - 36.0 g/dL   RDW 78.213.3 95.611.5 - 21.314.5 %   Platelets 216 150 - 440 K/uL  Comprehensive metabolic panel     Status: Abnormal   Collection Time: 2018-07-13  9:12 PM  Result Value Ref Range   Sodium 138 135 - 145 mmol/L   Potassium 2.7 (LL) 3.5 - 5.1 mmol/L   Chloride 98 (L) 101 - 111 mmol/L   CO2 26 22 - 32 mmol/L   Glucose, Bld 85 65 - 99 mg/dL   BUN 15 6 - 20 mg/dL   Creatinine, Ser 0.860.99 0.61 - 1.24 mg/dL   Calcium 9.3 8.9 - 57.810.3 mg/dL   Total Protein 8.4 (H) 6.5 - 8.1 g/dL   Albumin 4.7 3.5 - 5.0 g/dL   AST 45 (H) 15 - 41 U/L   ALT 37 17 - 63 U/L   Alkaline Phosphatase 69 38 - 126 U/L   Total Bilirubin 0.9 0.3 - 1.2 mg/dL   GFR calc non Af Amer >60 >60 mL/min   GFR calc Af Amer >60 >60 mL/min   Anion gap 14 5 - 15  Magnesium     Status: None   Collection Time: 2018-07-13  9:12 PM  Result Value Ref  Range   Magnesium 1.7 1.7 - 2.4 mg/dL  Phosphorus     Status: None   Collection Time: 2018-07-13  9:12 PM  Result Value Ref Range   Phosphorus 3.1 2.5 - 4.6 mg/dL  Procalcitonin - Baseline     Status: None   Collection Time: 2018-07-13  9:12 PM  Result Value Ref Range   Procalcitonin <0.10 ng/mL  Glucose, capillary     Status: Abnormal   Collection Time: 2018-07-13 11:37 PM  Result Value Ref Range   Glucose-Capillary 102 (H) 65 - 99 mg/dL  MRSA PCR Screening     Status: None   Collection Time: 2018-07-13 11:38 PM  Result Value Ref Range   MRSA by PCR NEGATIVE NEGATIVE  Draw ABG 1 hour after initiation of ventilator     Status: Abnormal   Collection Time: 2018-07-13 11:55 PM  Result  Value Ref Range   FIO2 0.40    Delivery systems VENTILATOR    Mode PRESSURE REGULATED VOLUME CONTROL    VT 500 mL   LHR 12 resp/min   Peep/cpap 5.0 cm H20   pH, Arterial 7.26 (L) 7.350 - 7.450   pCO2 arterial 63 (H) 32.0 - 48.0 mmHg   pO2, Arterial 95 83.0 - 108.0 mmHg   Bicarbonate 28.3 (H) 20.0 - 28.0 mmol/L   Acid-base deficit 0.4 0.0 - 2.0 mmol/L   O2 Saturation 96.2 %   Patient temperature 37.0    Collection site RIGHT RADIAL    Sample type ARTERIAL DRAW    Allens test (pass/fail) PASS PASS  Protime-INR     Status: None   Collection Time: 11/01/17 12:41 AM  Result Value Ref Range   Prothrombin Time 13.0 11.4 - 15.2 seconds   INR 0.99   Basic metabolic panel     Status: Abnormal   Collection Time: 11/01/17 12:41 AM  Result Value Ref Range   Sodium 140 135 - 145 mmol/L   Potassium 2.3 (LL) 3.5 - 5.1 mmol/L   Chloride 101 101 - 111 mmol/L   CO2 26 22 - 32 mmol/L   Glucose, Bld 117 (H) 65 - 99 mg/dL   BUN 12 6 - 20 mg/dL   Creatinine, Ser 4.54 0.61 - 1.24 mg/dL   Calcium 8.9 8.9 - 09.8 mg/dL   GFR calc non Af Amer >60 >60 mL/min   GFR calc Af Amer >60 >60 mL/min   Anion gap 13 5 - 15    SUBJECTIVE:  Intubated sedated   OBJECTIVE:  Tongue still swollen  IMPRESSION:  angioedema  PLAN:   Leave intubated until edema resolves.  Will sign off, if needed fill free to call.    Davina Poke 11/01/2017, 8:11 AM

## 2017-11-02 DIAGNOSIS — J9601 Acute respiratory failure with hypoxia: Secondary | ICD-10-CM

## 2017-11-02 DIAGNOSIS — T783XXA Angioneurotic edema, initial encounter: Principal | ICD-10-CM

## 2017-11-02 LAB — BASIC METABOLIC PANEL
Anion gap: 11 (ref 5–15)
Anion gap: 12 (ref 5–15)
BUN: 39 mg/dL — AB (ref 6–20)
BUN: 50 mg/dL — AB (ref 6–20)
CHLORIDE: 100 mmol/L — AB (ref 101–111)
CHLORIDE: 99 mmol/L — AB (ref 101–111)
CO2: 27 mmol/L (ref 22–32)
CO2: 26 mmol/L (ref 22–32)
CREATININE: 4.03 mg/dL — AB (ref 0.61–1.24)
CREATININE: 4.81 mg/dL — AB (ref 0.61–1.24)
Calcium: 8.1 mg/dL — ABNORMAL LOW (ref 8.9–10.3)
Calcium: 7.8 mg/dL — ABNORMAL LOW (ref 8.9–10.3)
GFR calc Af Amer: 17 mL/min — ABNORMAL LOW (ref >60)
GFR calc Af Amer: 14 mL/min — ABNORMAL LOW (ref >60)
GFR calc non Af Amer: 14 mL/min — ABNORMAL LOW (ref >60)
GFR calc non Af Amer: 12 mL/min — ABNORMAL LOW (ref >60)
GLUCOSE: 123 mg/dL — AB (ref 65–99)
Glucose, Bld: 134 mg/dL — ABNORMAL HIGH (ref 65–99)
POTASSIUM: 3.7 mmol/L (ref 3.5–5.1)
Potassium: 3.5 mmol/L (ref 3.5–5.1)
SODIUM: 138 mmol/L (ref 135–145)
Sodium: 137 mmol/L (ref 135–145)

## 2017-11-02 LAB — CBC
HEMATOCRIT: 36.4 % — AB (ref 40.0–52.0)
Hemoglobin: 13 g/dL (ref 13.0–18.0)
MCH: 32.9 pg (ref 26.0–34.0)
MCHC: 35.7 g/dL (ref 32.0–36.0)
MCV: 92 fL (ref 80.0–100.0)
Platelets: 172 10*3/uL (ref 150–440)
RBC: 3.96 MIL/uL — ABNORMAL LOW (ref 4.40–5.90)
RDW: 13.7 % (ref 11.5–14.5)
WBC: 8.6 10*3/uL (ref 3.8–10.6)

## 2017-11-02 LAB — HIV ANTIBODY (ROUTINE TESTING W REFLEX): HIV SCREEN 4TH GENERATION: NONREACTIVE

## 2017-11-02 LAB — TROPONIN I
Troponin I: 0.06 ng/mL (ref <0.03)
Troponin I: 0.05 ng/mL (ref <0.03)

## 2017-11-02 MED ORDER — ENOXAPARIN SODIUM 30 MG/0.3ML ~~LOC~~ SOLN: 30.0000 mg | SUBCUTANEOUS | Status: DC

## 2017-11-02 MED ORDER — LACTATED RINGERS IV SOLN
INTRAVENOUS | Status: DC
  Administered 2017-11-03: 40 mL/h via INTRAVENOUS

## 2017-11-02 MED ORDER — LACTATED RINGERS IV SOLN
INTRAVENOUS | Status: DC
  Administered 2017-11-02: 09:00:00 via INTRAVENOUS

## 2017-11-02 MED ORDER — HEPARIN SODIUM (PORCINE) 5000 UNIT/ML IJ SOLN
5000.0000 [IU] | Freq: Three times a day (TID) | INTRAMUSCULAR | Status: DC
  Administered 2017-11-02 – 2017-11-03 (×2): 5000 [IU] via SUBCUTANEOUS
  Filled 2017-11-02 (×2): qty 1

## 2017-11-02 NOTE — Consult Note (Signed)
Date: 11/02/2017                  Patient Name:  Dustin Orozco  MRN: [id number]  DOB: 1956-02-18  Age / Sex: 62 y.o., male         PCP: [place] Requesting Consult: ICU/ Dustin Flock, MD                 Reason for Consult: ARF            History of Present Illness: Patient is a 62 y.o. male with medical problems of Arthitis, HTN, who was admitted to [facility] on 10/27/2017 for evaluation of Angioedema.  Patient presented to the emergency room with tongue swelling yesterday evening.  It is thought to be secondary to lisinopril although patient has been taking the medication for many years.  He was emergently intubated in the OR with help of ENT and Anesthesia.  The intubation was difficult because of significant swelling of the base of the tongue. Patient is currently on ventilator support Hospital course is further complicated by acute renal failure Admission creatinine was 0.85 which decreased to 2.74 yesterday and has further worsened to 4.03 this morning. Urine output has dropped from 2500 cc to 110 cc last 24 hours   Medications: Outpatient medications: No medications prior to admission.    Current medications: Current Facility-Administered Medications  Medication Dose Route Frequency Provider Last Rate Last Dose  . acetaminophen (TYLENOL) tablet 650 mg  650 mg Oral Q4H PRN Lance Coon, MD      . bisacodyl (DULCOLAX) suppository 10 mg  10 mg Rectal Daily PRN Dorene Sorrow S, NP      . chlorhexidine gluconate (MEDLINE KIT) (PERIDEX) 0.12 % solution 15 mL  15 mL Mouth Rinse BID Dorene Sorrow S, NP   15 mL at 11/02/17 0715  . cloNIDine (CATAPRES - Dosed in mg/24 hr) patch 0.1 mg  0.1 mg Transdermal Weekly Patria Mane, Magadalene S, NP   0.1 mg at 11/01/17 0334  . dexamethasone (DECADRON) injection 4 mg  4 mg Intravenous Q6H Dustin Flock, MD   4 mg at 11/02/17 0549  . diphenhydrAMINE (BENADRYL) injection 25 mg  25 mg Intravenous Q6H PRN  Dorene Sorrow S, NP   25 mg at 11/01/17 0828  . famotidine (PEPCID) IVPB 20 mg premix  20 mg Intravenous Q12H Dustin Flock, MD   Stopped at 11/02/17 0957  . fentaNYL (SUBLIMAZE) injection 25-50 mcg  25-50 mcg Intravenous Q2H PRN Wilhelmina Mcardle, MD   50 mcg at 11/01/17 1701  . fentaNYL 2550mg in NS 2535m(1079mml) infusion-PREMIX  0-400 mcg/hr Intravenous Continuous KasFlora LippsD 35 mL/hr at 11/02/17 0918 350 mcg/hr at 11/02/17 0918  . heparin injection 5,000 Units  5,000 Units Subcutaneous Q8H Kasa, Kurian, MD      . hydrALAZINE (APRESOLINE) injection 10-40 mg  10-40 mg Intravenous Q4H PRN TukDorene Sorrow NP   20 mg at 11/01/17 0602  . ipratropium-albuterol (DUONEB) 0.5-2.5 (3) MG/3ML nebulizer solution 3 mL  3 mL Nebulization Q4H PRN SimWilhelmina McardleD      . LORazepam (ATIVAN) injection 1-2 mg  1-2 mg Intravenous Q1H PRN KasFlora LippsD   2 mg at 11/01/17 1935  . MEDLINE mouth rinse  15 mL Mouth Rinse 10 times per day TukMikael SprayP   15 mL at 11/02/17 0914  .  ondansetron (ZOFRAN) injection 4 mg  4 mg Intravenous Q6H PRN Lance Coon, MD      . sennosides (SENOKOT) 8.8 MG/5ML syrup 5 mL  5 mL Per Tube BID PRN Dorene Sorrow S, NP      . vecuronium (NORCURON) injection 10 mg  10 mg Intravenous Q1H PRN Mikael Spray, NP   10 mg at 11/01/17 0429      Allergies: Allergies  Allergen Reactions  . Lisinopril     Tongue swelling      Past Medical History: Past Medical History:  Diagnosis Date  . Arthritis   . Hypertension      Past Surgical History: Past Surgical History:  Procedure Laterality Date  . INTUBATION-ENDOTRACHEAL WITH TRACHEOSTOMY STANDBY N/A 11/12/2017   Procedure: INTUBATION-ENDOTRACHEAL;  Surgeon: Beverly Gust, MD;  Location: [facility] ORS;  Service: ENT;  Laterality: N/A;  . JOINT REPLACEMENT       Family History: History reviewed. No pertinent family history.   Social History: Social History   Socioeconomic History  .  Marital status: Single    Spouse name: Not on file  . Number of children: Not on file  . Years of education: Not on file  . Highest education level: Not on file  Occupational History  . Not on file  Social Needs  . Financial resource strain: Not on file  . Food insecurity:    Worry: Not on file    Inability: Not on file  . Transportation needs:    Medical: Not on file    Non-medical: Not on file  Tobacco Use  . Smoking status: Never Smoker  . Smokeless tobacco: Never Used  Substance and Sexual Activity  . Alcohol use: Yes  . 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 Sexual activity: Not on file  Lifestyle  . Physical activity:    Days per week: Not on file    Minutes per session: Not on file  . Stress: Not on file  Relationships  . Social connections:    Talks on phone: Not on file    Gets together: Not on file    Attends religious service: Not on file    Active member of club or organization: Not on file    Attends meetings of clubs or organizations: Not on file    Relationship status: Not on file  . Intimate partner violence:    Fear of current or ex partner: Not on file    Emotionally abused: Not on file    Physically abused: Not on file    Forced sexual activity: Not on file  Other Topics Concern  . Not on file  Social History Narrative  . Not on file     Review of Systems: n/a. Patient is intubated and sedated Gen:  HEENT:  CV:  Resp:  GI: GU :  MS:  Derm:   Psych: Heme:  Neuro:  Endocrine  Vital Signs: Blood pressure 115/77, pulse 66, temperature 99 F (37.2 C), resp. rate 20, height _0  (1.854 m), weight 198 lb 13.7 oz (90.2 kg), SpO2 93 %.   Intake/Output Summary (Last 24 hours) at 11/02/2017 1009 Last data filed at 11/02/2017 0400 Gross per 24 hour  Intake 1130.16 ml  Output 110 ml  Net 1020.16 ml    Weight trends: Filed Weights   11/03/2017 2113 11/01/17 0500 11/02/17 0400  Weight: 198 lb (89.8 kg) 191 lb 5.8 oz (86.8  kg) 198 lb 13.7 oz (90.2  kg)    Physical Exam: General:  No acute distress, laying in the bed  HEENT  pupils small, equal, anicteric sclera  Neck:  No masses, no JVD  Lungs:  Ventilator assisted, FiO2 60%  Heart::  Regular rhythm, sinus on telemetry  Abdomen:  Soft  Extremities:  No peripheral edema  Neurologic:  Sedated  Skin:  No acute rashes     Foley:  present       Lab results: Basic Metabolic Panel: Recent Labs  Lab 10/30/2017 2112 11/01/17 0041 11/01/17 1625 11/02/17 0218  NA 138 140 139 138  K 2.7* 2.3* 3.4* 3.7  CL 98* 101 101 100*  CO2 _0 GLUCOSE 85 117* 144* 134*  BUN 15 12 27* 39*  CREATININE 0.99 0.81 2.74* 4.03*  CALCIUM 9.3 8.9 8.3* 8.1*  MG 1.7  --   --   --   PHOS 3.1  --   --   --     Liver Function Tests: Recent Labs  Lab 11/11/2017 2112  AST 45*  ALT 37  ALKPHOS 69  BILITOT 0.9  PROT 8.4*  ALBUMIN 4.7   No results for input(s): LIPASE, AMYLASE in the last 168 hours. No results for input(s): AMMONIA in the last 168 hours.  CBC: Recent Labs  Lab 10/25/2017 2112 11/02/17 0218  WBC 5.7 8.6  HGB 14.6 13.0  HCT 41.4 36.4*  MCV 91.9 92.0  PLT 216 172    Cardiac Enzymes: Recent Labs  Lab 11/02/17 0839  TROPONINI 0.05*    BNP: Invalid input(s): POCBNP  CBG: Recent Labs  Lab 11/13/2017 2337  GLUCAP 102*    Microbiology: Recent Results (from the past 720 hour(s))  MRSA PCR Screening     Status: None   Collection Time: 11/10/2017 11:38 PM  Result Value Ref Range Status   MRSA by PCR NEGATIVE NEGATIVE Final    Comment:        The GeneXpert MRSA Assay (FDA approved for NASAL specimens only), is one component of a comprehensive MRSA colonization surveillance program. It is not intended to diagnose MRSA infection nor to guide or monitor treatment for MRSA infections. Performed at [facility], [address] [place], [place] [address]      Coagulation Studies: Recent Labs    11/01/17 0041  LABPROT  13.0  INR 0.99    Urinalysis: No results for input(s): COLORURINE, LABSPEC, PHURINE, GLUCOSEU, HGBUR, BILIRUBINUR, KETONESUR, PROTEINUR, UROBILINOGEN, NITRITE, LEUKOCYTESUR in the last 72 hours.  Invalid input(s): APPERANCEUR      Imaging: US Renal  Result Date: 11/01/2017 CLINICAL DATA:  62 year old male with urinary catheter in place. Unable to void. Initial encounter. EXAM: RENAL / URINARY TRACT ULTRASOUND COMPLETE COMPARISON:  None. FINDINGS: Right Kidney: Length: 10.3 cm. Echogenicity within normal limits. No hydronephrosis. Lower pole 1.3 x 1.3 x 1.4 cm cyst. Left Kidney: Length: 11 cm. Echogenicity within normal limits. No hydronephrosis. Mid aspect 3.1 x 2.8 x 3.1 cm cyst and 1.9 x 2.1 x 1.9 cm cyst. Bladder: Decompressed by Foley catheter. IMPRESSION: No hydronephrosis. Bilateral renal cysts. Bladder decompressed by Foley catheter. Electronically Signed   By: Genia Del M.D.   On: 11/01/2017 16:17   Portable Chest Xray  Result Date: 11/01/2017 CLINICAL DATA:  ETT present EXAM: PORTABLE CHEST 1 VIEW COMPARISON:  11/21/2017 FINDINGS: ET tube has been withdrawn slightly, 3.4 cm above carina, satisfactory position. Redemonstration of cardiomegaly with calcified tortuous aorta. No consolidation or edema. IMPRESSION: Satisfactory ETT placement.  No active chest disease. Electronically Signed   By: Staci Righter M.D.   On: 11/01/2017 07:25   Portable Chest X-ray  Result Date: 11/01/2017 CLINICAL DATA:  Endotracheal tube placement EXAM: PORTABLE CHEST 1 VIEW COMPARISON:  None. FINDINGS: Endotracheal tube tip is low, measuring about 1.8 cm above the carina. Consider retracting about 1 cm. Shallow inspiration. Heart size and pulmonary vascularity are normal for technique. No airspace disease or consolidation in the lungs. No blunting of costophrenic angles. No pneumothorax. IMPRESSION: Endotracheal tube tip measures 1.8 cm above the carina. Shallow inspiration. No active pulmonary disease.  Electronically Signed   By: Lucienne Capers M.D.   On: 11/01/2017 00:30   Dg Abd Portable 1v  Result Date: 11/01/2017 CLINICAL DATA:  62 y/o  M; NG tube placement. EXAM: PORTABLE ABDOMEN - 1 VIEW COMPARISON:  None. FINDINGS: Enteric tube tip not visualized in the lower chest or abdomen, readjustment recommended. Clear lung bases. Prominent loops of small bowel in the abdomen may represent obstruction or ileus. IMPRESSION: 1. Enteric tube not visualized, readjustment recommended. 2. Prominent loops of small bowel in the abdomen may represent obstruction or ileus. These results will be called to the ordering clinician or representative by the Radiologist Assistant, and communication documented in the PACS or zVision Dashboard. Electronically Signed   By: Kristine Garbe M.D.   On: 11/01/2017 01:07      Assessment & Plan: Pt is a 62 y.o. African-American  male with a HTN, was admitted on 10/30/2017 with Angioedema secondary to ACE-i.  Bilateral renal cysts noted on ultrasound  1. ARF.  Oliguric Acute renal failure is likely secondary to severe ATN likely from hypotension.  Renal ultrasound is negative for obstruction. Currently managed with maintenance IV fluids Blood pressure has stabilized Urine output appears to be increasing some Electrolytes and volume status are acceptable.  No acute indication for dialysis at present Continue to monitor closely  2.  Hypokalemia Noted at admission.  No home medications available.  Nursing staff will request medication list from family Agree with replacement as necessary Agree with Ringer's lactate maintenance for now May change to normal saline if potassium tends to increase  3.  Acute respiratory failure from angioedema Currently on ventilator support      LOS: [place] 4/9/201910:09 AM  [place], [place]  Note: This note was prepared with Dragon dictation. Any transcription errors  are unintentional

## 2017-11-02 NOTE — Progress Notes (Signed)
Pharmacy lovenox dose adjustment. Patient ordered lovenox 40 mg subq daily for VTE prophylaxis.  CrCl 21.2 ml/min  Will reduce dose to lovenox 30 mg subq daily for VTE prophylaxis.  Thomasene Rippleavid Taequan Stockhausen, PharmD, BCPS Clinical Pharmacist 11/02/2017

## 2017-11-02 NOTE — Consult Note (Signed)
PULMONARY / CRITICAL CARE MEDICINE   Name: Dustin Orozco MRN: [id number] DOB: August 19, 1955    ADMISSION DATE:  11/15/2017   CONSULTATION DATE:  11/20/2017  REFERRING MD:  Dr. Anne HahnWillis  Reason: Acute respiratory failure secondary to angioedema  HISTORY OF PRESENT ILLNESS:   Remains nasally intubated Critically ill Tongue and lip swelling noted   REVIEW OF SYSTEMS:   Unable to obtain as patient is currently sedated and intubated  SUBJECTIVE:   VITAL SIGNS: BP 102/71   Pulse (!) 59   Temp 98.5 F (36.9 C) (Axillary)   Resp 20   Ht 6\' 1"  (1.854 m)   Wt 191 lb 5.8 oz (86.8 kg)   SpO2 97%   BMI 25.25 kg/m   HEMODYNAMICS:    VENTILATOR SETTINGS: Vent Mode: PRVC FiO2 (%):  [40 %] 40 % Set Rate:  [20 bmp] 20 bmp Vt Set:  [500 mL] 500 mL PEEP:  [5 cmH20] 5 cmH20 Pressure Support:  [5 cmH20-8 cmH20] 5 cmH20 Plateau Pressure:  [14 cmH20] 14 cmH20  INTAKE / OUTPUT: I/O last 3 completed shifts: In: 1437 [I.V.:937; IV Piggyback:500] Out: 2550 [Urine:2550] PHYSICAL EXAMINATION: General: Sedated, nasal intubation Neuro: Withdraws to pain HEENT: PERRLA, nasotracheal tube in place, tongue is swollen and protruding out of the mouth Cardiovascular: Apical pulse regular, S1-S2, no murmur regurg or gallop, +2 pulses bilaterally, no edema Lungs:  CTAB Abdomen: Nondistended, normal bowel sounds in all 4 quadrants, palpation reveals no organomegaly Musculoskeletal: Positive range of motion, no deformities Skin: Warm and dry   LABS:  BMET Recent Labs  Lab 11/20/2017 2112 11/01/17 0041 11/01/17 1625  NA 138 140 139  K 2.7* 2.3* 3.4*  CL 98* 101 101  CO2 26 26 25   BUN 15 12 27*  CREATININE 0.99 0.81 2.74*  GLUCOSE 85 117* 144*    Electrolytes Recent Labs  Lab 10/30/2017 2112 11/01/17 0041 11/01/17 1625  CALCIUM 9.3 8.9 8.3*  MG 1.7  --   --   PHOS 3.1  --   --     CBC Recent Labs  Lab 11/01/2017 2112 11/02/17 0218  WBC 5.7 8.6  HGB 14.6 13.0  HCT 41.4 36.4*  PLT  216 172    Coag's Recent Labs  Lab 11/01/17 0041  INR 0.99    Sepsis Markers Recent Labs  Lab 11/02/2017 2112  PROCALCITON <0.10    ABG Recent Labs  Lab 11/10/2017 2355  PHART 7.26*  PCO2ART 63*  PO2ART 95    Liver Enzymes Recent Labs  Lab 11/19/2017 2112  AST 45*  ALT 37  ALKPHOS 69  BILITOT 0.9  ALBUMIN 4.7    Cardiac Enzymes Recent Labs  Lab 11/01/17 1625  TROPONINI 0.06*    Glucose Recent Labs  Lab 11/06/2017 2337  GLUCAP 102*    Imaging [place] Renal  Result Date: 11/01/2017 CLINICAL DATA:  62 year old male with urinary catheter in place. Unable to void. Initial encounter. EXAM: RENAL / URINARY TRACT ULTRASOUND COMPLETE COMPARISON:  None. FINDINGS: Right Kidney: Length: 10.3 cm. Echogenicity within normal limits. No hydronephrosis. Lower pole 1.3 x 1.3 x 1.4 cm cyst. Left Kidney: Length: 11 cm. Echogenicity within normal limits. No hydronephrosis. Mid aspect 3.1 x 2.8 x 3.1 cm cyst and 1.9 x 2.1 x 1.9 cm cyst. Bladder: Decompressed by Foley catheter. IMPRESSION: No hydronephrosis. Bilateral renal cysts. Bladder decompressed by Foley catheter. Electronically Signed   By: Lacy DuverneySteven  Olson M.D.   On: 11/01/2017 16:17   Portable Chest Xray  Result Date: 11/01/2017  CLINICAL DATA:  ETT present EXAM: PORTABLE CHEST 1 VIEW COMPARISON:  11-23-17 FINDINGS: ET tube has been withdrawn slightly, 3.4 cm above carina, satisfactory position. Redemonstration of cardiomegaly with calcified tortuous aorta. No consolidation or edema. IMPRESSION: Satisfactory ETT placement.  No active chest disease. Electronically Signed   By: Elsie Stain M.D.   On: 11/01/2017 07:25    SIGNIFICANT EVENTS: November 23, 2017>ADMITTED  LINES/TUBES: PIVs ETT  DISCUSSION: 62 year old male presenting with respiratory failure from airway obstruction secondary to ACE induced angioedema with severe resp failure  ASSESSMENT  Acute hypoxic respiratory failure secondary to angioedema Acute airway obstruction  secondary to angioedema ACE induced angioedema Hypokalemia History of hypertension  PLAN Hemodynamic monitoring per ICU protocol Full vent support with current settings Chest x-ray and ABG as needed ENT following Versed, as needed vecuronium and propofol for vent sedation  Decadron, Benadryl, and H2 blocker IV fluids Monitor and replace electrolytes GI and DVT prophylaxis   Plan for trial of extubation when swelling improves   Critical Care Time devoted to patient care services described in this note is 36 minutes.   Overall, patient is critically ill, prognosis is guarded.  Patient with Multiorgan failure and at high risk for cardiac arrest and death.    Lucie Leather, M.D.  [facility]  Medical Director [facility] Medical Director [facility] Cardio-Pulmonary Department

## 2017-11-02 NOTE — Progress Notes (Signed)
Pharmacy Electrolyte Monitoring Consult:  Pharmacy consulted to assist in monitoring and replacing electrolytes in this 62 y.o. male admitted on 10/28/2017 with angioedema secondary to ACE Inhibitor.   Patient ordered LR with 40 mEq at 9175mL/hr.   Labs:  Sodium (mmol/L)  Date Value  11/02/2017 137   Potassium (mmol/L)  Date Value  11/02/2017 3.5   Magnesium (mg/dL)  Date Value  16/10/9604January 16, 2019 1.7   Phosphorus (mg/dL)  Date Value  54/09/8119January 16, 2019 3.1   Calcium (mg/dL)  Date Value  14/78/295604/03/2018 7.8 (L)   Albumin (g/dL)  Date Value  21/30/8657January 16, 2019 4.7    Plan: Will remove potassium from fluid and per conversation with CCM and Nephrology, will continue LR @ 5140mL/hr. If potassium continues to trend up, will transition to NS per nephrology recommendations.   Renal function panel ordered with am labs on 4/10.   Pharmacy will continue to monitor and adjust per consult.   Rei Contee L 11/02/2017 1:36 PM

## 2017-11-02 NOTE — Progress Notes (Signed)
Per Dr Belia HemanKasa no sedation vacation this am. Notified of elevated troponin and renal function. At this time no orders for cardiology consult. Orders entered for Nephrology consult. No new orders given at this time, continue to monitor.

## 2017-11-02 NOTE — Progress Notes (Signed)
[place] at [facility]                                                                                                                                                                                  Patient Demographics   Dustin Orozco, is a 62 y.o. male, DOB - [birth year], TLX:[id number]  Admit date - 11/15/2017   Admitting Physician Lance Coon, MD  Outpatient Primary MD for the patient is [facility] [place]   LOS - 2  Subjective: Patient swelling significantly improved    Review of Systems:   CONSTITUTIONAL: Remains on the ventilator  Vitals:   Vitals:   11/02/17 1000 11/02/17 1100 11/02/17 1200 11/02/17 1418  BP: 113/74 101/74 92/69   Pulse: 69 70 70   Resp: 20 20 20    Temp:   99 F (37.2 C)   TempSrc:      SpO2: 95% 95% 95% 95%  Weight:      Height:        Wt Readings from Last 3 Encounters:  11/02/17 90.2 kg (198 lb 13.7 oz)     Intake/Output Summary (Last 24 hours) at 11/02/2017 1512 Last data filed at 11/02/2017 1200 Gross per 24 hour  Intake 696.71 ml  Output 190 ml  Net 506.71 ml    Physical Exam:   GENERAL: Critically ill HEAD, EYES, EARS, NOSE AND THROAT: Atraumatic, normocephalic.  Significant tongue swelling pupils equal and reactive to light. Sclerae anicteric. No conjunctival injection. No oro-pharyngeal erythema.  NECK: Supple. There is no jugular venous distention. No bruits, no lymphadenopathy, no thyromegaly.  HEART: Regular rate and rhythm,. No murmurs, no rubs, no clicks.  LUNGS: On the vent.  ABDOMEN: Soft, flat, nontender, nondistended. Has good bowel sounds. No hepatosplenomegaly appreciated.  EXTREMITIES: No evidence of any cyanosis, clubbing, or peripheral edema.  +2 pedal and radial pulses bilaterally.  NEUROLOGIC: The patient is alert, awake, and oriented x3 with no focal motor or sensory deficits appreciated bilaterally.  SKIN: Moist and warm with no rashes appreciated.  Psych: Not anxious,  depressed LN: No inguinal LN enlargement    Antibiotics   Anti-infectives (From admission, onward)   None      Medications   Scheduled Meds: . chlorhexidine gluconate (MEDLINE KIT)  15 mL Mouth Rinse BID  . cloNIDine  0.1 mg Transdermal Weekly  . dexamethasone  4 mg Intravenous Q6H  . heparin injection (subcutaneous)  5,000 Units Subcutaneous Q8H  . mouth rinse  15 mL Mouth Rinse 10 times per day   Continuous Infusions: . famotidine (PEPCID) IV Stopped (11/02/17 0957)  . fentaNYL infusion INTRAVENOUS 350 mcg/hr (11/02/17 1011)  . lactated ringers  40 mL/hr at 11/02/17 1027   PRN Meds:.acetaminophen, bisacodyl, diphenhydrAMINE, fentaNYL (SUBLIMAZE) injection, hydrALAZINE, ipratropium-albuterol, LORazepam, ondansetron (ZOFRAN) IV, sennosides, vecuronium   Data Review:   Micro Results Recent Results (from the past 240 hour(s))  MRSA PCR Screening     Status: None   Collection Time: 10/28/2017 11:38 PM  Result Value Ref Range Status   MRSA by PCR NEGATIVE NEGATIVE Final    Comment:        The GeneXpert MRSA Assay (FDA approved for NASAL specimens only), is one component of a comprehensive MRSA colonization surveillance program. It is not intended to diagnose MRSA infection nor to guide or monitor treatment for MRSA infections. Performed at [facility], [address], [place], [place] [address]     Radiology Reports US Renal  Result Date: 11/01/2017 CLINICAL DATA:  62 year old male with urinary catheter in place. Unable to void. Initial encounter. EXAM: RENAL / URINARY TRACT ULTRASOUND COMPLETE COMPARISON:  None. FINDINGS: Right Kidney: Length: 10.3 cm. Echogenicity within normal limits. No hydronephrosis. Lower pole 1.3 x 1.3 x 1.4 cm cyst. Left Kidney: Length: 11 cm. Echogenicity within normal limits. No hydronephrosis. Mid aspect 3.1 x 2.8 x 3.1 cm cyst and 1.9 x 2.1 x 1.9 cm cyst. Bladder: Decompressed by Foley catheter. IMPRESSION: No hydronephrosis.  Bilateral renal cysts. Bladder decompressed by Foley catheter. Electronically Signed   By: Genia Del M.D.   On: 11/01/2017 16:17   Portable Chest Xray  Result Date: 11/01/2017 CLINICAL DATA:  ETT present EXAM: PORTABLE CHEST 1 VIEW COMPARISON:  11/05/2017 FINDINGS: ET tube has been withdrawn slightly, 3.4 cm above carina, satisfactory position. Redemonstration of cardiomegaly with calcified tortuous aorta. No consolidation or edema. IMPRESSION: Satisfactory ETT placement.  No active chest disease. Electronically Signed   By: Staci Righter M.D.   On: 11/01/2017 07:25   Portable Chest X-ray  Result Date: 11/01/2017 CLINICAL DATA:  Endotracheal tube placement EXAM: PORTABLE CHEST 1 VIEW COMPARISON:  None. FINDINGS: Endotracheal tube tip is low, measuring about 1.8 cm above the carina. Consider retracting about 1 cm. Shallow inspiration. Heart size and pulmonary vascularity are normal for technique. No airspace disease or consolidation in the lungs. No blunting of costophrenic angles. No pneumothorax. IMPRESSION: Endotracheal tube tip measures 1.8 cm above the carina. Shallow inspiration. No active pulmonary disease. Electronically Signed   By: Lucienne Capers M.D.   On: 11/01/2017 00:30   Dg Abd Portable 1v  Result Date: 11/01/2017 CLINICAL DATA:  62 y/o  M; NG tube placement. EXAM: PORTABLE ABDOMEN - 1 VIEW COMPARISON:  None. FINDINGS: Enteric tube tip not visualized in the lower chest or abdomen, readjustment recommended. Clear lung bases. Prominent loops of small bowel in the abdomen may represent obstruction or ileus. IMPRESSION: 1. Enteric tube not visualized, readjustment recommended. 2. Prominent loops of small bowel in the abdomen may represent obstruction or ileus. These results will be called to the ordering clinician or representative by the Radiologist Assistant, and communication documented in the PACS or zVision Dashboard. Electronically Signed   By: Kristine Garbe M.D.   On:  11/01/2017 01:07     CBC Recent Labs  Lab 11/05/2017 2112 11/02/17 0218  WBC 5.7 8.6  HGB 14.6 13.0  HCT 41.4 36.4*  PLT 216 172  MCV 91.9 92.0  MCH 32.4 32.9  MCHC 35.2 35.7  RDW 13.3 13.7    Chemistries  Recent Labs  Lab 11/07/2017 2112 11/01/17 0041 11/01/17 1625 11/02/17 0218 11/02/17 1300  NA 138 140 139  138 137  K 2.7* 2.3* 3.4* 3.7 3.5  CL 98* 101 101 100* 99*  CO2 26 26 25 27 26   GLUCOSE 85 117* 144* 134* 123*  BUN 15 12 27* 39* 50*  CREATININE 0.99 0.81 2.74* 4.03* 4.81*  CALCIUM 9.3 8.9 8.3* 8.1* 7.8*  MG 1.7  --   --   --   --   AST 45*  --   --   --   --   ALT 37  --   --   --   --   ALKPHOS 69  --   --   --   --   BILITOT 0.9  --   --   --   --    ------------------------------------------------------------------------------------------------------------------ estimated creatinine clearance is 17.8 mL/min (A) (by C-G formula based on SCr of 4.81 mg/dL (H)). ------------------------------------------------------------------------------------------------------------------ No results for input(s): HGBA1C in the last 72 hours. ------------------------------------------------------------------------------------------------------------------ Recent Labs    10/30/2017 2112  TRIG 187*   ------------------------------------------------------------------------------------------------------------------ No results for input(s): TSH, T4TOTAL, T3FREE, THYROIDAB in the last 72 hours.  Invalid input(s): FREET3 ------------------------------------------------------------------------------------------------------------------ No results for input(s): VITAMINB12, FOLATE, FERRITIN, TIBC, IRON, RETICCTPCT in the last 72 hours.  Coagulation profile Recent Labs  Lab 11/01/17 0041  INR 0.99    No results for input(s): DDIMER in the last 72 hours.  Cardiac Enzymes Recent Labs  Lab 11/01/17 1625 11/02/17 0218 11/02/17 0839  TROPONINI 0.06* 0.06* 0.05*    ------------------------------------------------------------------------------------------------------------------ Invalid input(s): POCBNP    Assessment & Plan  Patient is a 62 year old African-American male admitted with angioedema and required emergent intubation  #1 angioedema -continue ventilator Continue Decadron and Pepcid swelling much improved #2  Acute respiratory failure with hypoxia -due to his angioedema and obstruction of his airway, intubated as above #3   Acute renal failure with oliguria nephrology is following #4 HTN (hypertension) -blood pressure stable will need other antihypertensives on discharge besides ACE      Code Status Orders  (From admission, onward)        Start     Ordered   11/10/2017 2322  Full code  Continuous     11/03/2017 2321    Code Status History    This patient has a current code status but no historical code status.           Consults intensive  DVT Prophylaxis  Lovenox   Lab Results  Component Value Date   PLT 172 11/02/2017     Time Spent in minutes   35 minutes  Greater than 50% of time spent in care coordination and counseling patient regarding the condition and plan of care.   Dustin Flock M.D on 11/02/2017 at 3:12 PM  Between 7am to 6pm - Pager - [phone]  After 6pm go to [email or web address] - Proofreader  [employer]   Office  [phone]

## 2017-11-02 NOTE — Progress Notes (Signed)
11/02/2017 8:34 AM  Dustin Orozco, Dustin Orozco [id number]  Post-Op Day 2    Temp:  [98 F (36.7 C)-99.7 F (37.6 C)] 99 F (37.2 C) (04/09 0700) Pulse Rate:  [56-103] 67 (04/09 0700) Resp:  [12-26] 20 (04/09 0700) BP: (92-132)/(69-98) 102/71 (04/09 0700) SpO2:  [91 %-100 %] 93 % (04/09 0801) FiO2 (%):  [40 %] 40 % (04/09 0801) Weight:  [90.2 kg (198 lb 13.7 oz)] 90.2 kg (198 lb 13.7 oz) (04/09 0400),     Intake/Output Summary (Last 24 hours) at 11/02/2017 0834 Last data filed at 11/02/2017 0400 Gross per 24 hour  Intake 1130.16 ml  Output 110 ml  Net 1020.16 ml    Results for orders placed or performed during the hospital encounter of 11/03/2017 (from the past 24 hour(s))  Urine Drug Screen, Qualitative ([facility] only)     Status: Abnormal   Collection Time: 11/01/17  8:41 AM  Result Value Ref Range   Tricyclic, Ur Screen NONE DETECTED NONE DETECTED   Amphetamines, Ur Screen NONE DETECTED NONE DETECTED   MDMA (Ecstasy)Ur Screen NONE DETECTED NONE DETECTED   Cocaine Metabolite,Ur [place] NONE DETECTED NONE DETECTED   Opiate, Ur Screen NONE DETECTED NONE DETECTED   Phencyclidine (PCP) Ur S NONE DETECTED NONE DETECTED   Cannabinoid 50 Ng, Ur [place] NONE DETECTED NONE DETECTED   Barbiturates, Ur Screen NONE DETECTED NONE DETECTED   Benzodiazepine, Ur Scrn POSITIVE (A) NONE DETECTED   Methadone Scn, Ur NONE DETECTED NONE DETECTED  Basic metabolic panel     Status: Abnormal   Collection Time: 11/01/17  4:25 PM  Result Value Ref Range   Sodium 139 135 - 145 mmol/L   Potassium 3.4 (L) 3.5 - 5.1 mmol/L   Chloride 101 101 - 111 mmol/L   CO2 25 22 - 32 mmol/L   Glucose, Bld 144 (H) 65 - 99 mg/dL   BUN 27 (H) 6 - 20 mg/dL   Creatinine, Ser 5.622.74 (H) 0.61 - 1.24 mg/dL   Calcium 8.3 (L) 8.9 - 10.3 mg/dL   GFR calc non Af Amer 23 (L) >60 mL/min   GFR calc Af Amer 27 (L) >60 mL/min   Anion gap 13 5 - 15  Troponin I (q 6hr x 3)     Status: Abnormal   Collection Time: 11/01/17  4:25 PM  Result Value Ref Range    Troponin I 0.06 (HH) <0.03 ng/mL  Basic metabolic panel     Status: Abnormal   Collection Time: 11/02/17  2:18 AM  Result Value Ref Range   Sodium 138 135 - 145 mmol/L   Potassium 3.7 3.5 - 5.1 mmol/L   Chloride 100 (L) 101 - 111 mmol/L   CO2 27 22 - 32 mmol/L   Glucose, Bld 134 (H) 65 - 99 mg/dL   BUN 39 (H) 6 - 20 mg/dL   Creatinine, Ser 1.304.03 (H) 0.61 - 1.24 mg/dL   Calcium 8.1 (L) 8.9 - 10.3 mg/dL   GFR calc non Af Amer 14 (L) >60 mL/min   GFR calc Af Amer 17 (L) >60 mL/min   Anion gap 11 5 - 15  CBC     Status: Abnormal   Collection Time: 11/02/17  2:18 AM  Result Value Ref Range   WBC 8.6 3.8 - 10.6 K/uL   RBC 3.96 (L) 4.40 - 5.90 MIL/uL   Hemoglobin 13.0 13.0 - 18.0 g/dL   HCT 86.536.4 (L) 78.440.0 - 69.652.0 %   MCV 92.0 80.0 - 100.0 fL  MCH 32.9 26.0 - 34.0 pg   MCHC 35.7 32.0 - 36.0 g/dL   RDW 29.5 62.1 - 30.8 %   Platelets 172 150 - 440 K/uL  Troponin I (q 6hr x 3)     Status: Abnormal   Collection Time: 11/02/17  2:18 AM  Result Value Ref Range   Troponin I 0.06 (HH) <0.03 ng/mL    SUBJECTIVE:  Intubated and sedated  OBJECTIVE:  Tongue swelling has significantly decreased, but still edematous  IMPRESSION:  angioedema  PLAN:  Would give another 24 hours before extubation attempt, assuming tongue swelling continues to decrease.    Davina Poke 11/02/2017, 8:34 AM

## 2017-11-03 ENCOUNTER — Inpatient Hospital Stay: Payer: Medicare Other

## 2017-11-03 DIAGNOSIS — N179 Acute kidney failure, unspecified: Secondary | ICD-10-CM

## 2017-11-03 DIAGNOSIS — Z9911 Dependence on respirator [ventilator] status: Secondary | ICD-10-CM

## 2017-11-03 DIAGNOSIS — T783XXD Angioneurotic edema, subsequent encounter: Secondary | ICD-10-CM

## 2017-11-03 LAB — RENAL FUNCTION PANEL
Albumin: 3.3 g/dL — ABNORMAL LOW (ref 3.5–5.0)
Anion gap: 14 (ref 5–15)
BUN: 67 mg/dL — ABNORMAL HIGH (ref 6–20)
CHLORIDE: 100 mmol/L — AB (ref 101–111)
CO2: 25 mmol/L (ref 22–32)
CREATININE: 5.33 mg/dL — AB (ref 0.61–1.24)
Calcium: 7.6 mg/dL — ABNORMAL LOW (ref 8.9–10.3)
GFR calc Af Amer: 12 mL/min — ABNORMAL LOW (ref >60)
GFR, EST NON AFRICAN AMERICAN: 10 mL/min — AB (ref >60)
Glucose, Bld: 126 mg/dL — ABNORMAL HIGH (ref 65–99)
POTASSIUM: 3.9 mmol/L (ref 3.5–5.1)
Phosphorus: 11.6 mg/dL — ABNORMAL HIGH (ref 2.5–4.6)
Sodium: 139 mmol/L (ref 135–145)

## 2017-11-03 MED ORDER — ENOXAPARIN SODIUM 30 MG/0.3ML ~~LOC~~ SOLN
30.0000 mg | SUBCUTANEOUS | Status: DC
  Administered 2017-11-03: 30 mg via SUBCUTANEOUS
  Filled 2017-11-03: qty 0.3

## 2017-11-03 MED ORDER — LACTATED RINGERS IV SOLN
INTRAVENOUS | Status: DC
  Administered 2017-11-03: 50 mL/h via INTRAVENOUS
  Administered 2017-11-04: 08:00:00 via INTRAVENOUS

## 2017-11-03 MED ORDER — FAMOTIDINE IN NACL 20-0.9 MG/50ML-% IV SOLN
20.0000 mg | INTRAVENOUS | Status: DC
  Administered 2017-11-03: 20 mg via INTRAVENOUS

## 2017-11-03 MED ORDER — FUROSEMIDE 10 MG/ML IJ SOLN
80.0000 mg | Freq: Once | INTRAMUSCULAR | Status: AC
  Administered 2017-11-03: 80 mg via INTRAVENOUS
  Filled 2017-11-03: qty 8

## 2017-11-03 NOTE — Progress Notes (Signed)
Patient placed back venturi mask due to low saturations on 5l [place], sats increased to 91%

## 2017-11-03 NOTE — Progress Notes (Signed)
[place] at [facility]                                                                                                                                                                                  Patient Demographics   Dustin Orozco, is a 62 y.o. male, DOB - Jul 04, 1956, MVH:[id number]  Admit date - 11/10/2017   Admitting Physician Lance Coon, MD  Outpatient Primary MD for the patient is [facility] [place]   LOS - 3  Subjective: Remains on the ventilator    Review of Systems:   CONSTITUTIONAL: Remains on the ventilator  Vitals:   Vitals:   11/03/17 0831 11/03/17 0900 11/03/17 1000 11/03/17 1117  BP:  115/67 98/66   Pulse:  93 76   Resp:  (!) 22 (!) 8   Temp: 98.9 F (37.2 C)     TempSrc: Oral     SpO2:  (!) 89% 91% 95%  Weight:      Height:        Wt Readings from Last 3 Encounters:  11/03/17 90.2 kg (198 lb 13.7 oz)     Intake/Output Summary (Last 24 hours) at 11/03/2017 1511 Last data filed at 11/03/2017 1050 Gross per 24 hour  Intake 1641.75 ml  Output 305 ml  Net 1336.75 ml    Physical Exam:   GENERAL: Critically ill HEAD, EYES, EARS, NOSE AND THROAT: Atraumatic, normocephalic.  Significant tongue swelling pupils equal and reactive to light. Sclerae anicteric. No conjunctival injection. No oro-pharyngeal erythema.  NECK: Supple. There is no jugular venous distention. No bruits, no lymphadenopathy, no thyromegaly.  HEART: Regular rate and rhythm,. No murmurs, no rubs, no clicks.  LUNGS: On the vent.  ABDOMEN: Soft, flat, nontender, nondistended. Has good bowel sounds. No hepatosplenomegaly appreciated.  EXTREMITIES: No evidence of any cyanosis, clubbing, or peripheral edema.  +2 pedal and radial pulses bilaterally.  NEUROLOGIC: The patient is alert, awake, and oriented x3 with no focal motor or sensory deficits appreciated bilaterally.  SKIN: Moist and warm with no rashes appreciated.  Psych: Not anxious, depressed LN:  No inguinal LN enlargement    Antibiotics   Anti-infectives (From admission, onward)   None      Medications   Scheduled Meds: . chlorhexidine gluconate (MEDLINE KIT)  15 mL Mouth Rinse BID  . cloNIDine  0.1 mg Transdermal Weekly  . enoxaparin (LOVENOX) injection  40 mg Subcutaneous Q24H  . mouth rinse  15 mL Mouth Rinse 10 times per day   Continuous Infusions: . lactated ringers 50 mL/hr (11/03/17 1445)   PRN Meds:.bisacodyl, hydrALAZINE, ipratropium-albuterol, ondansetron (ZOFRAN) IV, sennosides   Data Review:   Micro Results  Recent Results (from the past 240 hour(s))  MRSA PCR Screening     Status: None   Collection Time: 10/29/2017 11:38 PM  Result Value Ref Range Status   MRSA by PCR NEGATIVE NEGATIVE Final    Comment:        The GeneXpert MRSA Assay (FDA approved for NASAL specimens only), is one component of a comprehensive MRSA colonization surveillance program. It is not intended to diagnose MRSA infection nor to guide or monitor treatment for MRSA infections. Performed at [facility], [address], [place], [place] [address]     Radiology Reports US Renal  Result Date: 11/01/2017 CLINICAL DATA:  62 year old male with urinary catheter in place. Unable to void. Initial encounter. EXAM: RENAL / URINARY TRACT ULTRASOUND COMPLETE COMPARISON:  None. FINDINGS: Right Kidney: Length: 10.3 cm. Echogenicity within normal limits. No hydronephrosis. Lower pole 1.3 x 1.3 x 1.4 cm cyst. Left Kidney: Length: 11 cm. Echogenicity within normal limits. No hydronephrosis. Mid aspect 3.1 x 2.8 x 3.1 cm cyst and 1.9 x 2.1 x 1.9 cm cyst. Bladder: Decompressed by Foley catheter. IMPRESSION: No hydronephrosis. Bilateral renal cysts. Bladder decompressed by Foley catheter. Electronically Signed   By: Genia Del M.D.   On: 11/01/2017 16:17   Dg Chest Port 1 View  Result Date: 11/03/2017 CLINICAL DATA:  Respiratory failure. History of hypertension. Nonsmoker. EXAM:  PORTABLE CHEST 1 VIEW COMPARISON:  Chest x-ray of November 01, 2017 FINDINGS: The lungs are adequately inflated. There is hazy increased density at the lung bases which is new. The heart is top-normal in size. The pulmonary vascularity is normal. The endotracheal tube tip lies approximately 3 cm above the carina. The bony thorax is unremarkable. IMPRESSION: Interval development of bibasilar atelectasis. Probable small posterior layering pleural fluid collection on the right. The endotracheal tube is in reasonable position. Electronically Signed   By: David  Martinique M.D.   On: 11/03/2017 10:54   Portable Chest Xray  Result Date: 11/01/2017 CLINICAL DATA:  ETT present EXAM: PORTABLE CHEST 1 VIEW COMPARISON:  11/16/2017 FINDINGS: ET tube has been withdrawn slightly, 3.4 cm above carina, satisfactory position. Redemonstration of cardiomegaly with calcified tortuous aorta. No consolidation or edema. IMPRESSION: Satisfactory ETT placement.  No active chest disease. Electronically Signed   By: Staci Righter M.D.   On: 11/01/2017 07:25   Portable Chest X-ray  Result Date: 11/01/2017 CLINICAL DATA:  Endotracheal tube placement EXAM: PORTABLE CHEST 1 VIEW COMPARISON:  None. FINDINGS: Endotracheal tube tip is low, measuring about 1.8 cm above the carina. Consider retracting about 1 cm. Shallow inspiration. Heart size and pulmonary vascularity are normal for technique. No airspace disease or consolidation in the lungs. No blunting of costophrenic angles. No pneumothorax. IMPRESSION: Endotracheal tube tip measures 1.8 cm above the carina. Shallow inspiration. No active pulmonary disease. Electronically Signed   By: Lucienne Capers M.D.   On: 11/01/2017 00:30   Dg Abd Portable 1v  Result Date: 11/01/2017 CLINICAL DATA:  62 y/o  M; NG tube placement. EXAM: PORTABLE ABDOMEN - 1 VIEW COMPARISON:  None. FINDINGS: Enteric tube tip not visualized in the lower chest or abdomen, readjustment recommended. Clear lung bases. Prominent  loops of small bowel in the abdomen may represent obstruction or ileus. IMPRESSION: 1. Enteric tube not visualized, readjustment recommended. 2. Prominent loops of small bowel in the abdomen may represent obstruction or ileus. These results will be called to the ordering clinician or representative by the Radiologist Assistant, and communication documented in the PACS or  zVision Dashboard. Electronically Signed   By: Kristine Garbe M.D.   On: 11/01/2017 01:07     CBC Recent Labs  Lab 11/03/2017 2112 11/02/17 0218  WBC 5.7 8.6  HGB 14.6 13.0  HCT 41.4 36.4*  PLT 216 172  MCV 91.9 92.0  MCH 32.4 32.9  MCHC 35.2 35.7  RDW 13.3 13.7    Chemistries  Recent Labs  Lab 11/05/2017 2112 11/01/17 0041 11/01/17 1625 11/02/17 0218 11/02/17 1300 11/03/17 0535  NA 138 140 139 138 137 139  K 2.7* 2.3* 3.4* 3.7 3.5 3.9  CL 98* 101 101 100* 99* 100*  CO2 26 26 25 27 26 25   GLUCOSE 85 117* 144* 134* 123* 126*  BUN 15 12 27* 39* 50* 67*  CREATININE 0.99 0.81 2.74* 4.03* 4.81* 5.33*  CALCIUM 9.3 8.9 8.3* 8.1* 7.8* 7.6*  MG 1.7  --   --   --   --   --   AST 45*  --   --   --   --   --   ALT 37  --   --   --   --   --   ALKPHOS 69  --   --   --   --   --   BILITOT 0.9  --   --   --   --   --    ------------------------------------------------------------------------------------------------------------------ estimated creatinine clearance is 16 mL/min (A) (by C-G formula based on SCr of 5.33 mg/dL (H)). ------------------------------------------------------------------------------------------------------------------ No results for input(s): HGBA1C in the last 72 hours. ------------------------------------------------------------------------------------------------------------------ Recent Labs    11/14/2017 2112  TRIG 187*   ------------------------------------------------------------------------------------------------------------------ No results for input(s): TSH, T4TOTAL, T3FREE,  THYROIDAB in the last 72 hours.  Invalid input(s): FREET3 ------------------------------------------------------------------------------------------------------------------ No results for input(s): VITAMINB12, FOLATE, FERRITIN, TIBC, IRON, RETICCTPCT in the last 72 hours.  Coagulation profile Recent Labs  Lab 11/01/17 0041  INR 0.99    No results for input(s): DDIMER in the last 72 hours.  Cardiac Enzymes Recent Labs  Lab 11/01/17 1625 11/02/17 0218 11/02/17 0839  TROPONINI 0.06* 0.06* 0.05*   ------------------------------------------------------------------------------------------------------------------ Invalid input(s): POCBNP    Assessment & Plan  Patient is a 62 year old African-American male admitted with angioedema and required emergent intubation  #1 angioedema -continue ventilator Continue Decadron and Pepcid swelling much improved, extubation per pulmonary #2  Acute respiratory failure with hypoxia -due to his angioedema and obstruction of his airway, intubated as above #3   Acute renal failure with oliguria nephrology is following #4 HTN (hypertension) continue to monitor blood pressure      Code Status Orders  (From admission, onward)        Start     Ordered   11/14/2017 2322  Full code  Continuous     10/29/2017 2321    Code Status History    This patient has a current code status but no historical code status.           Consults intensive  DVT Prophylaxis  Lovenox   Lab Results  Component Value Date   PLT 172 11/02/2017     Time Spent in minutes   35 minutes  Greater than 50% of time spent in care coordination and counseling patient regarding the condition and plan of care.   Dustin Flock M.D on 11/03/2017 at 3:11 PM  Between 7am to 6pm - Pager - [phone]  After 6pm go to [email or web address] - Proofreader  [employer]   Office  [phone]

## 2017-11-03 NOTE — Progress Notes (Signed)
11/03/2017 7:54 AM  Elenora GammaLee, [employer] [id number]  Post-Op Day 3    Temp:  [98.3 F (36.8 C)-99.4 F (37.4 C)] 99.4 F (37.4 C) (04/10 0600) Pulse Rate:  [58-94] 71 (04/10 0600) Resp:  [17-20] 20 (04/10 0600) BP: (89-115)/(64-78) 102/69 (04/10 0600) SpO2:  [91 %-99 %] 99 % (04/10 0600) FiO2 (%):  [40 %-60 %] 60 % (04/10 0314) Weight:  [90.2 kg (198 lb 13.7 oz)] 90.2 kg (198 lb 13.7 oz) (04/10 0258),     Intake/Output Summary (Last 24 hours) at 11/03/2017 0754 Last data filed at 11/03/2017 [id number] Gross per 24 hour  Intake 1483.25 ml  Output 270 ml  Net 1213.25 ml    Results for orders placed or performed during the hospital encounter of 07/26/18 (from the past 24 hour(s))  Troponin I (q 6hr x 3)     Status: Abnormal   Collection Time: 11/02/17  8:39 AM  Result Value Ref Range   Troponin I 0.05 (HH) <0.03 ng/mL  Basic metabolic panel     Status: Abnormal   Collection Time: 11/02/17  1:00 PM  Result Value Ref Range   Sodium 137 135 - 145 mmol/L   Potassium 3.5 3.5 - 5.1 mmol/L   Chloride 99 (L) 101 - 111 mmol/L   CO2 26 22 - 32 mmol/L   Glucose, Bld 123 (H) 65 - 99 mg/dL   BUN 50 (H) 6 - 20 mg/dL   Creatinine, Ser 1.194.81 (H) 0.61 - 1.24 mg/dL   Calcium 7.8 (L) 8.9 - 10.3 mg/dL   GFR calc non Af Amer 12 (L) >60 mL/min   GFR calc Af Amer 14 (L) >60 mL/min   Anion gap 12 5 - 15  Renal function panel     Status: Abnormal   Collection Time: 11/03/17  5:35 AM  Result Value Ref Range   Sodium 139 135 - 145 mmol/L   Potassium 3.9 3.5 - 5.1 mmol/L   Chloride 100 (L) 101 - 111 mmol/L   CO2 25 22 - 32 mmol/L   Glucose, Bld 126 (H) 65 - 99 mg/dL   BUN 67 (H) 6 - 20 mg/dL   Creatinine, Ser 1.475.33 (H) 0.61 - 1.24 mg/dL   Calcium 7.6 (L) 8.9 - 10.3 mg/dL   Phosphorus 82.911.6 (H) 2.5 - 4.6 mg/dL   Albumin 3.3 (L) 3.5 - 5.0 g/dL   GFR calc non Af Amer 10 (L) >60 mL/min   GFR calc Af Amer 12 (L) >60 mL/min   Anion gap 14 5 - 15    SUBJECTIVE:  Intubated sedated  OBJECTIVE:  Tongue  swelling markedly decreased  IMPRESSION:  Angioedema-appears mostly resolved.    PLAN:  From ENT standpoint swelling much improved.  I think safe for extubation trial.  I will sign off.    Davina PokeChapman T Marguerite Jarboe 11/03/2017, 7:54 AM

## 2017-11-03 NOTE — Progress Notes (Signed)
[place], [place] 11/03/17  Subjective:   Remains critically ill.  Oliguric anuric.  Urine output 270 cc last 24 hours Continued on ventilator support Serum creatinine has worsened to 5.3 today   Objective:  Vital signs in last 24 hours:  Temp:  [98.3 F (36.8 C)-99.4 F (37.4 C)] 98.9 F (37.2 C) (04/10 0831) Pulse Rate:  [58-94] 76 (04/10 1000) Resp:  [8-22] 8 (04/10 1000) BP: (89-115)/(63-78) 98/66 (04/10 1000) SpO2:  [89 %-99 %] 95 % (04/10 1117) FiO2 (%):  [40 %-60 %] 40 % (04/10 1117) Weight:  [198 lb 13.7 oz (90.2 kg)] 198 lb 13.7 oz (90.2 kg) (04/10 0258)  Weight change: 0 lb (0 kg) Filed Weights   11/01/17 0500 11/02/17 0400 11/03/17 0258  Weight: 191 lb 5.8 oz (86.8 kg) 198 lb 13.7 oz (90.2 kg) 198 lb 13.7 oz (90.2 kg)    Intake/Output:    Intake/Output Summary (Last 24 hours) at 11/03/2017 1219 Last data filed at 11/03/2017 1050 Gross per 24 hour  Intake 1641.75 ml  Output 315 ml  Net 1326.75 ml     Physical Exam: General:  Critically ill-appearing  HEENT  eyes open, nasally intubated  Neck  no masses  Pulm/lungs  ventilator assisted, FiO2 40%  CVS/Heart  regular, tachycardic  Abdomen:   Soft, nontender, nondistended  Extremities:  No peripheral edema  Neurologic:  Sedated  Skin:  No acute rashes    Foley in place       Basic Metabolic Panel:  Recent Labs  Lab 10/29/2017 2112 11/01/17 0041 11/01/17 1625 11/02/17 0218 11/02/17 1300 11/03/17 0535  NA 138 140 139 138 137 139  K 2.7* 2.3* 3.4* 3.7 3.5 3.9  CL 98* 101 101 100* 99* 100*  CO2 26 26 25 27 26 25   GLUCOSE 85 117* 144* 134* 123* 126*  BUN 15 12 27* 39* 50* 67*  CREATININE 0.99 0.81 2.74* 4.03* 4.81* 5.33*  CALCIUM 9.3 8.9 8.3* 8.1* 7.8* 7.6*  MG 1.7  --   --   --   --   --   PHOS 3.1  --   --   --   --  11.6*     CBC: Recent Labs  Lab 10/29/2017 2112 11/02/17 0218  WBC 5.7 8.6  HGB 14.6 13.0  HCT 41.4 36.4*  MCV 91.9 92.0  PLT 216 172      No results found for: HEPBSAG, HEPBSAB, HEPBIGM    Microbiology:  Recent Results (from the past 240 hour(s))  MRSA PCR Screening     Status: None   Collection Time: 11/05/2017 11:38 PM  Result Value Ref Range Status   MRSA by PCR NEGATIVE NEGATIVE Final    Comment:        The GeneXpert MRSA Assay (FDA approved for NASAL specimens only), is one component of a comprehensive MRSA colonization surveillance program. It is not intended to diagnose MRSA infection nor to guide or monitor treatment for MRSA infections. Performed at [facility], [address] [place], [place] [address]     Coagulation Studies: Recent Labs    11/01/17 0041  LABPROT 13.0  INR 0.99    Urinalysis: No results for input(s): COLORURINE, LABSPEC, PHURINE, GLUCOSEU, HGBUR, BILIRUBINUR, KETONESUR, PROTEINUR, UROBILINOGEN, NITRITE, LEUKOCYTESUR in the last 72 hours.  Invalid input(s): APPERANCEUR    Imaging: US Renal  Result Date: 11/01/2017 CLINICAL DATA:  62 year old male with urinary catheter in place. Unable to void. Initial encounter. EXAM: RENAL / URINARY  TRACT ULTRASOUND COMPLETE COMPARISON:  None. FINDINGS: Right Kidney: Length: 10.3 cm. Echogenicity within normal limits. No hydronephrosis. Lower pole 1.3 x 1.3 x 1.4 cm cyst. Left Kidney: Length: 11 cm. Echogenicity within normal limits. No hydronephrosis. Mid aspect 3.1 x 2.8 x 3.1 cm cyst and 1.9 x 2.1 x 1.9 cm cyst. Bladder: Decompressed by Foley catheter. IMPRESSION: No hydronephrosis. Bilateral renal cysts. Bladder decompressed by Foley catheter. Electronically Signed   By: Genia Del M.D.   On: 11/01/2017 16:17   Dg Chest Port 1 View  Result Date: 11/03/2017 CLINICAL DATA:  Respiratory failure. History of hypertension. Nonsmoker. EXAM: PORTABLE CHEST 1 VIEW COMPARISON:  Chest x-ray of November 01, 2017 FINDINGS: The lungs are adequately inflated. There is hazy increased density at the lung bases which is new. The heart is  top-normal in size. The pulmonary vascularity is normal. The endotracheal tube tip lies approximately 3 cm above the carina. The bony thorax is unremarkable. IMPRESSION: Interval development of bibasilar atelectasis. Probable small posterior layering pleural fluid collection on the right. The endotracheal tube is in reasonable position. Electronically Signed   By: David  Martinique M.D.   On: 11/03/2017 10:54     Medications:   . famotidine (PEPCID) IV Stopped (11/03/17 0900)  . fentaNYL infusion INTRAVENOUS 0 mcg/hr (11/03/17 0921)   . chlorhexidine gluconate (MEDLINE KIT)  15 mL Mouth Rinse BID  . cloNIDine  0.1 mg Transdermal Weekly  . heparin injection (subcutaneous)  5,000 Units Subcutaneous Q8H  . mouth rinse  15 mL Mouth Rinse 10 times per day   bisacodyl, fentaNYL (SUBLIMAZE) injection, hydrALAZINE, ipratropium-albuterol, ondansetron (ZOFRAN) IV, sennosides  Assessment/ Plan:  62 y.o.AA male with a HTN, was admitted on 11/19/2017 with Angioedema secondary to ACE-i.  Bilateral renal cysts noted on ultrasound  1. ARF.  Oliguric Acute renal failure is likely secondary to severe ATN likely from hypotension.  Renal ultrasound is negative for obstruction. BUN/ S Creatinine has worsened Currently managed with maintenance IV fluids Blood pressure has stabilized Urine output appears to be increasing some Electrolytes and volume status are acceptable.  No acute indication for dialysis at present Continue to monitor closely  2.  Hypokalemia  Triamterene/HCT at home  3.  Acute respiratory failure from angioedema Currently on ventilator support     LOS: [place] 4/10/201912:19 PM  [place], [place]  Note: This note was prepared with Dragon dictation. Any transcription errors are unintentional

## 2017-11-03 NOTE — Progress Notes (Signed)
Patient extubated to 4lnc, had to gradually increase fio2 up to 50% venturi mask to achieve saturations of 90% or greater. Saturations 90-92% on 50% VM

## 2017-11-03 NOTE — Progress Notes (Signed)
Report given to Martie LeeSabrina, RN who will take over patient's care.

## 2017-11-03 NOTE — Progress Notes (Signed)
PULMONARY / CRITICAL CARE MEDICINE   Name: Dustin Orozco MRN: [id number] DOB: [birth year]    ADMISSION DATE:  11/05/2017  PT PROFILE:   79 M on chronic ACE inhibitor therapy, nasally intubated intubated by ENT in ED for angioedema with severe macroglossia and lip swelling.  MAJOR EVENTS/TEST RESULTS: 04/08 developed anuric renal failure 04/08 Renal US: no hydro, bladder decompressed 04/09 Renal consultation: "Acute renal failure is likely secondary to severe ATN likely from hypotension.  Renal ultrasound is negative for obstruction"  INDWELLING DEVICES:: ETT (L naris) 04/07 >> 04/10  MICRO DATA: MRSA PCR 04/07 >> NEG  ANTIMICROBIALS:     SUBJECTIVE:  Passed SBT. + F/C. Cuff leak present. Extubated and tolerating well but requiring O2 by VM  VITAL SIGNS: BP 98/66   Pulse 76   Temp 98.9 F (37.2 C) (Oral)   Resp (!) 8   Ht 6\' 1"  (1.854 m)   Wt 198 lb 13.7 oz (90.2 kg)   SpO2 95%   BMI 26.24 kg/m   HEMODYNAMICS:    VENTILATOR SETTINGS: Vent Mode: PSV FiO2 (%):  [40 %-60 %] 40 % Set Rate:  [20 bmp] 20 bmp Vt Set:  [500 mL] 500 mL PEEP:  [5 cmH20] 5 cmH20 Pressure Support:  [5 cmH20-10 cmH20] 5 cmH20 Plateau Pressure:  [14 cmH20] 14 cmH20  INTAKE / OUTPUT: I/O last 3 completed shifts: In: 1877.3 [I.V.:1777.3; IV Piggyback:100] Out: 380 [Urine:380]  PHYSICAL EXAMINATION: General: RASS -1, + F/C Neuro: No focal deficits HEENT: Macroglossia has resolved Cardiovascular: Regular, no M Lungs: Clear without adventitious sounds Abdomen: Soft, NT, NABS Extremities: Warm, no edema Skin: No lesions noted  LABS:  BMET Recent Labs  Lab 11/02/17 0218 11/02/17 1300 11/03/17 0535  NA 138 137 139  K 3.7 3.5 3.9  CL 100* 99* 100*  CO2 27 26 25   BUN 39* 50* 67*  CREATININE 4.03* 4.81* 5.33*  GLUCOSE 134* 123* 126*    Electrolytes Recent Labs  Lab 10/25/2017 2112  11/02/17 0218 11/02/17 1300 11/03/17 0535  CALCIUM 9.3   < > 8.1* 7.8* 7.6*  MG 1.7  --   --    --   --   PHOS 3.1  --   --   --  11.6*   < > = values in this interval not displayed.    CBC Recent Labs  Lab 11/05/2017 2112 11/02/17 0218  WBC 5.7 8.6  HGB 14.6 13.0  HCT 41.4 36.4*  PLT 216 172    Coag's Recent Labs  Lab 11/01/17 0041  INR 0.99    Sepsis Markers Recent Labs  Lab 11/20/2017 2112  PROCALCITON <0.10    ABG Recent Labs  Lab 10/28/2017 2355  PHART 7.26*  PCO2ART 63*  PO2ART 95    Liver Enzymes Recent Labs  Lab 10/29/2017 2112 11/03/17 0535  AST 45*  --   ALT 37  --   ALKPHOS 69  --   BILITOT 0.9  --   ALBUMIN 4.7 3.3*    Cardiac Enzymes Recent Labs  Lab 11/01/17 1625 11/02/17 0218 11/02/17 0839  TROPONINI 0.06* 0.06* 0.05*    Glucose Recent Labs  Lab 11/14/2017 2337  GLUCAP 102*    CXR: Bibasilar opacities consistent with effusions/atelectasis    ASSESSMENT / PLAN:  PULMONARY A: Ventilator dependent respiratory failure due to angioedema and UAO Acute hypoxemic respiratory failure P:   Extubated under my supervision this morning Supplemental oxygen as needed to maintain SPO2 >90% Nebulized bronchodilators as needed  CARDIOVASCULAR  A:  Chronic hypertension, well controlled P:  On clonidine patch Hydralazine as needed  RENAL A:   AKI, oliguric Mild hypervolemia P:   Monitor BMET intermittently Monitor I/Os Correct electrolytes as indicated  Maintenance IVF's until taking p.o.'s Furosemide x1 dose 04/10  GASTROINTESTINAL A:   No acute issues Postextubation dysphagia P:   SUP: N/I postextubation NPO for now, consider SLP evaluation 04/11  HEMATOLOGIC A:   No acute issues P:  DVT px: Enoxaparin Monitor CBC intermittently Transfuse per usual guidelines   INFECTIOUS A:   No acute issues P:   Monitor temp, WBC count Micro and abx as above   ENDOCRINE A:   No acute issues P:   Monitor CBGs on chemistry panels Consider SSI for glucose >180  NEUROLOGIC A:   Lethargy P:   RASS goal:  0 Minimize all sedating medications   FAMILY  - Updates: Sister updated at bedside  CCM time: 45 mins The above time includes time spent in consultation with patient and/or family members and reviewing care plan on multidisciplinary rounds.  In addition, patient was assessed for extubation on 2 occasions and was assessed post extubation on 2 occasions throughout the day  Billy Fischeravid Scheryl Sanborn, MD PCCM service Mobile [phone] Pager [phone] 11/03/2017, 3:05 PM

## 2017-11-03 NOTE — Progress Notes (Signed)
Decreased FIO2 to 40% and Pressure Support to 5cm H2O.

## 2017-11-04 ENCOUNTER — Inpatient Hospital Stay: Payer: Medicare Other

## 2017-11-04 DIAGNOSIS — E876 Hypokalemia: Secondary | ICD-10-CM

## 2017-11-04 DIAGNOSIS — F10231 Alcohol dependence with withdrawal delirium: Secondary | ICD-10-CM

## 2017-11-04 LAB — COMPREHENSIVE METABOLIC PANEL
ALK PHOS: 61 U/L (ref 38–126)
ALT: 26 U/L (ref 17–63)
ANION GAP: 14 (ref 5–15)
AST: 26 U/L (ref 15–41)
Albumin: 3.3 g/dL — ABNORMAL LOW (ref 3.5–5.0)
BUN: 62 mg/dL — AB (ref 6–20)
CALCIUM: 8.8 mg/dL — AB (ref 8.9–10.3)
CO2: 27 mmol/L (ref 22–32)
Chloride: 101 mmol/L (ref 101–111)
Creatinine, Ser: 2.8 mg/dL — ABNORMAL HIGH (ref 0.61–1.24)
GFR calc Af Amer: 26 mL/min — ABNORMAL LOW (ref >60)
GFR, EST NON AFRICAN AMERICAN: 22 mL/min — AB (ref >60)
Glucose, Bld: 138 mg/dL — ABNORMAL HIGH (ref 65–99)
Potassium: 2.5 mmol/L — CL (ref 3.5–5.1)
Sodium: 142 mmol/L (ref 135–145)
TOTAL PROTEIN: 7.5 g/dL (ref 6.5–8.1)
Total Bilirubin: 1.4 mg/dL — ABNORMAL HIGH (ref 0.3–1.2)

## 2017-11-04 LAB — CBC
HEMATOCRIT: 36.9 % — AB (ref 40.0–52.0)
Hemoglobin: 13.2 g/dL (ref 13.0–18.0)
MCH: 32.9 pg (ref 26.0–34.0)
MCHC: 35.7 g/dL (ref 32.0–36.0)
MCV: 92.3 fL (ref 80.0–100.0)
Platelets: 143 10*3/uL — ABNORMAL LOW (ref 150–440)
RBC: 3.99 MIL/uL — ABNORMAL LOW (ref 4.40–5.90)
RDW: 13.5 % (ref 11.5–14.5)
WBC: 10.4 10*3/uL (ref 3.8–10.6)

## 2017-11-04 LAB — GLUCOSE, CAPILLARY: Glucose-Capillary: 142 mg/dL — ABNORMAL HIGH (ref 65–99)

## 2017-11-04 LAB — POTASSIUM: POTASSIUM: 2.8 mmol/L — AB (ref 3.5–5.1)

## 2017-11-04 MED ORDER — PREMIER PROTEIN SHAKE: 2.0000 [oz_av] | Freq: Three times a day (TID) | ORAL | Status: DC

## 2017-11-04 MED ORDER — CLONIDINE HCL 0.3 MG/24HR TD PTWK
0.3000 mg | MEDICATED_PATCH | TRANSDERMAL | Status: DC
  Administered 2017-11-04: 0.3 mg via TRANSDERMAL
  Filled 2017-11-04: qty 1

## 2017-11-04 MED ORDER — POTASSIUM CHLORIDE 10 MEQ/100ML IV SOLN
10.0000 meq | INTRAVENOUS | Status: AC
  Administered 2017-11-04 (×4): 10 meq via INTRAVENOUS
  Filled 2017-11-04 (×4): qty 100

## 2017-11-04 MED ORDER — POTASSIUM CHLORIDE 2 MEQ/ML IV SOLN
INTRAVENOUS | Status: DC
  Administered 2017-11-04 (×2): via INTRAVENOUS
  Filled 2017-11-04 (×5): qty 1000

## 2017-11-04 MED ORDER — ENOXAPARIN SODIUM 40 MG/0.4ML ~~LOC~~ SOLN
40.0000 mg | SUBCUTANEOUS | Status: DC
  Administered 2017-11-04 – 2017-11-09 (×6): 40 mg via SUBCUTANEOUS
  Filled 2017-11-04 (×6): qty 0.4

## 2017-11-04 MED ORDER — METOPROLOL TARTRATE 5 MG/5ML IV SOLN
2.5000 mg | INTRAVENOUS | Status: DC | PRN
  Administered 2017-11-04 – 2017-11-10 (×5): 5 mg via INTRAVENOUS
  Administered 2017-11-16 (×2): 2.5 mg via INTRAVENOUS
  Filled 2017-11-04 (×7): qty 5

## 2017-11-04 MED ORDER — POTASSIUM CHLORIDE 10 MEQ/100ML IV SOLN
10.0000 meq | INTRAVENOUS | Status: AC
  Administered 2017-11-04 – 2017-11-05 (×4): 10 meq via INTRAVENOUS
  Filled 2017-11-04 (×4): qty 100

## 2017-11-04 MED ORDER — LORAZEPAM 2 MG/ML IJ SOLN
1.0000 mg | INTRAMUSCULAR | Status: AC
  Administered 2017-11-04: 1 mg via INTRAVENOUS
  Filled 2017-11-04: qty 1

## 2017-11-04 MED ORDER — DEXMEDETOMIDINE HCL IN NACL 400 MCG/100ML IV SOLN
INTRAVENOUS | Status: AC
  Administered 2017-11-04: 1 ug/kg/h via INTRAVENOUS
  Filled 2017-11-04: qty 100

## 2017-11-04 MED ORDER — DEXMEDETOMIDINE HCL IN NACL 400 MCG/100ML IV SOLN
0.0000 ug/kg/h | INTRAVENOUS | Status: DC
  Administered 2017-11-04 – 2017-11-05 (×5): 1 ug/kg/h via INTRAVENOUS
  Administered 2017-11-05: 0.8 ug/kg/h via INTRAVENOUS
  Administered 2017-11-06: 1 ug/kg/h via INTRAVENOUS
  Administered 2017-11-06 (×2): 0.8 ug/kg/h via INTRAVENOUS
  Administered 2017-11-06: 1.044 ug/kg/h via INTRAVENOUS
  Administered 2017-11-07: 1.2 ug/kg/h via INTRAVENOUS
  Administered 2017-11-07: 2 ug/kg/h via INTRAVENOUS
  Administered 2017-11-07: 0.9 ug/kg/h via INTRAVENOUS
  Administered 2017-11-07: 2 ug/kg/h via INTRAVENOUS
  Filled 2017-11-04 (×17): qty 100

## 2017-11-04 MED ORDER — FENTANYL CITRATE (PF) 100 MCG/2ML IJ SOLN
25.0000 ug | INTRAMUSCULAR | Status: DC | PRN
  Administered 2017-11-04 – 2017-11-07 (×3): 25 ug via INTRAVENOUS
  Filled 2017-11-04 (×3): qty 2

## 2017-11-04 MED ORDER — LORAZEPAM 2 MG/ML IJ SOLN
0.5000 mg | INTRAMUSCULAR | Status: DC | PRN
  Administered 2017-11-05 – 2017-11-08 (×8): 1 mg via INTRAVENOUS
  Filled 2017-11-04 (×9): qty 1

## 2017-11-04 NOTE — Progress Notes (Signed)
Pt appears to be acutely withdrawing.  Writer inquired of sister this AM whether patient drinks ETOH.  Sister confirmed patient drinks vodka daily, although she is not sure how much.  Pt with mild tremors, extreme agitation and some delirium, tossing and turning continually in bed, although not trying to get OOB.  Unable to answer questions or be redirected.  Physician aware, Ativan has been ordered for patient,  Has been administered and also he has received one extra dose per physician.  He is tachycardic in 120s at times and remains hypertensive with DBP>100, despite hydralazine and metoprolol IV. O2 sats 88-94% on 6 liters [place]

## 2017-11-04 NOTE — Progress Notes (Signed)
Nutrition Follow-up  DOCUMENTATION CODES:   Not applicable  INTERVENTION:  Patient was ordered for Premier Protein po TID for when he is alert enough for PO intake. Each supplement provides 160 kcal and 30 grams of protein.  RD will continue monitoring for diet advancement.  Once patient has a diet recommend providing MVI daily, thiamine 100 mg daily, folic acid 1 mg daily.  Patient is now day 4 of inadequate intake as enteral access was unable to be obtained while patient was intubated. Will need to consider nutrition support by 4/14 if diet is unable to be advanced by then.  NUTRITION DIAGNOSIS:   Inadequate oral intake related to inability to eat as evidenced by NPO status.  Ongoing.  GOAL:   Provide needs based on ASPEN/SCCM guidelines  Not met - awaiting diet advancement.  MONITOR:   Vent status, Labs, Weight trends, TF tolerance, I & O's  REASON FOR ASSESSMENT:   Ventilator    ASSESSMENT:   61 year old male with PMHx of HTN, arthritis who is admitted with acute angioedema secondary to lisinopril s/p intubation on 4/7 by ENT.   -Patient was extubated on 4/10.  Patient agitated and delirious. Not appropriate for a diet quite yet. MD ordered some Premier for him to drink when he is alert enough. Patient drinks vodka daily. Enteral access was unable to be obtained during intubation so patient is now day 4 of inadequate intake. Patient has not had a documented bowel movement this admission.  Medications reviewed and include: LR @ 100 mL/hr, potassium chloride 10 mEq IV 4 times today.  Labs reviewed: Potassium 2.5, BUN 62, Creatinine 2.8.  I/O: 4050 mL UOP yesterday (2 mL/kg/hr)  Weight trend: 86.2 kg on 4/11; -0.6 kg from admission  Discussed with RN and on rounds.  Diet Order:  No diet orders on file  EDUCATION NEEDS:   No education needs have been identified at this time  Skin:  Skin Assessment: Reviewed RN Assessment  Last BM:  Unknown/PTA  Height:    Ht Readings from Last 1 Encounters:  11/04/2017 6' 1" (1.854 m)    Weight:   Wt Readings from Last 1 Encounters:  11/04/17 190 lb 0.6 oz (86.2 kg)    Ideal Body Weight:  83.6 kg  BMI:  Body mass index is 25.07 kg/m.  Estimated Nutritional Needs:   Kcal:  2065-2240 (MSJ x 1.2-1.3)  Protein:  104-122 grams (1.2-1.4 grams/kg)  Fluid:  1.8-2 L/day  Leanne Stephens, MS, RD, LDN Office: [phone] Pager: [phone] After Hours/Weekend Pager: [phone]  

## 2017-11-04 NOTE — Progress Notes (Addendum)
[facility], [place] 11/04/17  Subjective:   Extubated now UOP > 4 L able to follow simple commands   Objective:  Vital signs in last 24 hours:  Temp:  [98 F (36.7 C)-98.9 F (37.2 C)] 98 F (36.7 C) (04/11 0200) Pulse Rate:  [71-111] 72 (04/11 0600) Resp:  [7-24] 13 (04/11 0600) BP: (98-180)/(66-112) 180/112 (04/11 0801) SpO2:  [87 %-99 %] 93 % (04/11 0600) FiO2 (%):  [40 %-50 %] 40 % (04/10 2319) Weight:  [190 lb 0.6 oz (86.2 kg)] 190 lb 0.6 oz (86.2 kg) (04/11 0500)  Weight change: -8 lb 13.1 oz (-4 kg) Filed Weights   11/02/17 0400 11/03/17 0258 11/04/17 0500  Weight: 198 lb 13.7 oz (90.2 kg) 198 lb 13.7 oz (90.2 kg) 190 lb 0.6 oz (86.2 kg)    Intake/Output:    Intake/Output Summary (Last 24 hours) at 11/04/2017 0859 Last data filed at 11/04/2017 0522 Gross per 24 hour  Intake 701 ml  Output 4050 ml  Net -3349 ml     Physical Exam: General:  ill-appearing, agitated  HEENT  eyes open, mouth dry  Neck  no masses  Pulm/lungs   O2, coarse  CVS/Heart  regular, tachycardic  Abdomen:   Soft, nontender, nondistended  Extremities:  No peripheral edema  Neurologic:  able to follow simple commands  Skin:  No acute rashes    Foley in place       Basic Metabolic Panel:  Recent Labs  Lab 11/13/2017 2112  11/01/17 1625 11/02/17 0218 11/02/17 1300 11/03/17 0535 11/04/17 0630  NA 138   < > 139 138 137 139 142  K 2.7*   < > 3.4* 3.7 3.5 3.9 2.5*  CL 98*   < > 101 100* 99* 100* 101  CO2 26   < > 25 27 26 25 27   GLUCOSE 85   < > 144* 134* 123* 126* 138*  BUN 15   < > 27* 39* 50* 67* 62*  CREATININE 0.99   < > 2.74* 4.03* 4.81* 5.33* 2.80*  CALCIUM 9.3   < > 8.3* 8.1* 7.8* 7.6* 8.8*  MG 1.7  --   --   --   --   --   --   PHOS 3.1  --   --   --   --  11.6*  --    < > = values in this interval not displayed.     CBC: Recent Labs  Lab 11/20/2017 2112 11/02/17 0218 11/04/17 0630  WBC 5.7 8.6 10.4  HGB 14.6 13.0 13.2  HCT 41.4  36.4* 36.9*  MCV 91.9 92.0 92.3  PLT 216 172 143*     No results found for: HEPBSAG, HEPBSAB, HEPBIGM    Microbiology:  Recent Results (from the past 240 hour(s))  MRSA PCR Screening     Status: None   Collection Time: 10/30/2017 11:38 PM  Result Value Ref Range Status   MRSA by PCR NEGATIVE NEGATIVE Final    Comment:        The GeneXpert MRSA Assay (FDA approved for NASAL specimens only), is one component of a comprehensive MRSA colonization surveillance program. It is not intended to diagnose MRSA infection nor to guide or monitor treatment for MRSA infections. Performed at [facility], [address] [place],  [address]     Coagulation Studies: No results for input(s): LABPROT, INR in the last 72 hours.  Urinalysis: No results for input(s): COLORURINE, LABSPEC, [place], [place], [place],  BILIRUBINUR, KETONESUR, PROTEINUR, UROBILINOGEN, NITRITE, LEUKOCYTESUR in the last 72 hours.  Invalid input(s): APPERANCEUR    Imaging: Dg Chest Port 1 View  Result Date: 11/03/2017 CLINICAL DATA:  Respiratory failure. History of hypertension. Nonsmoker. EXAM: PORTABLE CHEST 1 VIEW COMPARISON:  Chest x-ray of November 01, 2017 FINDINGS: The lungs are adequately inflated. There is hazy increased density at the lung bases which is new. The heart is top-normal in size. The pulmonary vascularity is normal. The endotracheal tube tip lies approximately 3 cm above the carina. The bony thorax is unremarkable. IMPRESSION: Interval development of bibasilar atelectasis. Probable small posterior layering pleural fluid collection on the right. The endotracheal tube is in reasonable position. Electronically Signed   By: David  Martinique M.D.   On: 11/03/2017 10:54     Medications:   . lactated ringers with kcl    . potassium chloride 10 mEq (11/04/17 0758)   . chlorhexidine gluconate (MEDLINE KIT)  15 mL Mouth Rinse BID  . cloNIDine  0.3 mg Transdermal Weekly  . enoxaparin (LOVENOX)  injection  30 mg Subcutaneous Q24H  . mouth rinse  15 mL Mouth Rinse 10 times per day   bisacodyl, fentaNYL (SUBLIMAZE) injection, hydrALAZINE, ipratropium-albuterol, metoprolol tartrate, ondansetron (ZOFRAN) IV, sennosides  Assessment/ Plan:  62 y.o.AA male with a HTN, was admitted on 10/25/2017 with Angioedema secondary to ACE-i.  Bilateral renal cysts noted on ultrasound  1. ARF.  Oliguric Acute renal failure is likely secondary to severe ATN likely from hypotension.  Renal ultrasound is negative for obstruction.   UOP BUN  and Creatinine have improved Post ATN diuresis Increase maintenance fluids  2.  Hypokalemia  Triamterene/HCT at home Replace as necessary  3.  Acute respiratory failure from angioedema Extubated now  Will sign off Please reconsult as necessary    LOS: [place] 4/11/20198:59 AM  [place], [place]  Note: This note was prepared with Dragon dictation. Any transcription errors are unintentional

## 2017-11-04 NOTE — Progress Notes (Signed)
Pt resting calmly, eyes closed at this time. Precedex gtt at 1 mcg/kg/hr.  6 liters [place],  He remains hypertensive, hydralazine somewhat effective.  HR has remained < 100 since he calmed on gtt.  RR in mid twenties.  He rouses readily to stimulation

## 2017-11-04 NOTE — Progress Notes (Signed)
PULMONARY / CRITICAL CARE MEDICINE   Name: Dustin Orozco MRN: [id number] DOB: 1955/11/05    ADMISSION DATE:  11/14/17  PT PROFILE:   60 M on chronic ACE inhibitor therapy, nasally intubated intubated by ENT in ED for angioedema with severe macroglossia and lip swelling.  MAJOR EVENTS/TEST RESULTS: 04/08 developed anuric renal failure 04/08 Renal US: no hydro, bladder decompressed 04/09 Renal consultation: "Acute renal failure is likely secondary to severe ATN likely from hypotension.  Renal ultrasound is negative for obstruction" 04/10 Extubated. Tolerated well but requiring Datto O2 @ 6 LPM 04/11 tolerating extubation well.  However, increasing agitated delirium.  Wife reports heavy chronic alcohol use.  Lorazepam initiated.  Persistent agitation.  Dexmedetomidine infusion initiated.  INDWELLING DEVICES:: ETT (L naris) 04/07 >> 04/10  MICRO DATA: MRSA PCR 04/07 >> NEG  ANTIMICROBIALS:     SUBJECTIVE:  Agitated delirium.  Suspected alcohol withdrawal.  Improved after initiation of dexmedetomidine.  No overt respiratory distress.  Strong cough.  Not following commands.  VITAL SIGNS: BP (!) 190/143   Pulse (!) 132   Temp 98.6 F (37 C) (Oral)   Resp (!) 26   Ht 6\' 1"  (1.854 m)   Wt 190 lb 0.6 oz (86.2 kg)   SpO2 (!) 87%   BMI 25.07 kg/m   HEMODYNAMICS:    VENTILATOR SETTINGS: FiO2 (%):  [40 %-50 %] 40 %  INTAKE / OUTPUT: I/O last 3 completed shifts: In: 2264.3 [I.V.:2204.3; Other:10; IV Piggyback:50] Out: 4230 [Urine:4230]  PHYSICAL EXAMINATION: General: RASS -1 to +1, not F/C.  No overt respiratory distress. Neuro: No focal neurological deficits HEENT: NCAT, sclerae white Cardiovascular: RRR, no M Lungs: Few scattered rhonchi bilaterally Abdomen: Soft, NT, NABS Extremities: Warm, no edema  LABS:  BMET Recent Labs  Lab 11/02/17 1300 11/03/17 0535 11/04/17 0630  NA 137 139 142  K 3.5 3.9 2.5*  CL 99* 100* 101  CO2 26 25 27   BUN 50* 67* 62*  CREATININE  4.81* 5.33* 2.80*  GLUCOSE 123* 126* 138*    Electrolytes Recent Labs  Lab 11/14/17 2112  11/02/17 1300 11/03/17 0535 11/04/17 0630  CALCIUM 9.3   < > 7.8* 7.6* 8.8*  MG 1.7  --   --   --   --   PHOS 3.1  --   --  11.6*  --    < > = values in this interval not displayed.    CBC Recent Labs  Lab 2017/11/14 2112 11/02/17 0218 11/04/17 0630  WBC 5.7 8.6 10.4  HGB 14.6 13.0 13.2  HCT 41.4 36.4* 36.9*  PLT 216 172 143*    Coag's Recent Labs  Lab 11/01/17 0041  INR 0.99    Sepsis Markers Recent Labs  Lab 11-14-17 2112  PROCALCITON <0.10    ABG Recent Labs  Lab November 14, 2017 2355  PHART 7.26*  PCO2ART 63*  PO2ART 95    Liver Enzymes Recent Labs  Lab 11/14/17 2112 11/03/17 0535 11/04/17 0630  AST 45*  --  26  ALT 37  --  26  ALKPHOS 69  --  61  BILITOT 0.9  --  1.4*  ALBUMIN 4.7 3.3* 3.3*    Cardiac Enzymes Recent Labs  Lab 11/01/17 1625 11/02/17 0218 11/02/17 0839  TROPONINI 0.06* 0.06* 0.05*    Glucose Recent Labs  Lab 11/14/2017 2337  GLUCAP 102*    CXR: Mild vascular congestion, mild RLL atelectasis    ASSESSMENT / PLAN:  PULMONARY A: VDRF, resolved Angioedema, UAO-resolved  Acute hypoxemic  respiratory failure P:   Continue to monitor as SDU status Continue supplemental oxygen as needed to maintain SPO2 >90% Continue nebulized bronchodilators as needed  CARDIOVASCULAR A:  Chronic hypertension, well controlled P:  Continue clonidine patch Continue hydralazine as needed  RENAL A:   AKI, oliguric Mild hypervolemia Hypokalemia P:   Monitor BMET intermittently Monitor I/Os Correct electrolytes as indicated -KCl per on 04/11 Continue maintenance IVF's -adjusted 04/11  GASTROINTESTINAL A:   Dysphagia due to AMS P:   SUP: N/I postextubation NPO for now, consider SLP evaluation when cognition permits  HEMATOLOGIC A:   Very mild thrombocytopenia P:  DVT px: Enoxaparin Monitor CBC intermittently Transfuse per  usual guidelines   INFECTIOUS A:   No acute issues P:   Monitor temp, WBC count Micro and abx as above   ENDOCRINE A:   Mild hyperglycemia without prior history of DM P:   CBGs q 8 hrs Consider SSI for glucose >180  NEUROLOGIC A:   Lethargy, resolved History of heavy alcohol use Agitated delirium -suspect alcohol withdrawal syndrome  P:   RASS goal: 0 Dexmedetomidine infusion initiated 04/11 Lorazepam as needed initiated 04/11   Billy Fischeravid Simonds, MD PCCM service Mobile [phone] Pager [phone] 11/04/2017, 3:41 PM

## 2017-11-04 NOTE — Progress Notes (Signed)
[facility]                                                                                                                                                                                  Patient Demographics   Dustin Orozco, is a 62 y.o. male, DOB - 25-Aug-1955, TXM:[id number]  Admit date - 11/08/2017   Admitting Physician Lance Coon, MD  Outpatient Primary MD for the patient is [facility], [place]   LOS - 4  Subjective:  Patient extubated complains of being thirsty  Review of Systems:   CONSTITUTIONAL: No documented fever. No fatigue, weakness. No weight gain, no weight loss.  EYES: No blurry or double vision.  ENT: No tinnitus. No postnasal drip. No redness of the oropharynx.  RESPIRATORY: No cough, no wheeze, no hemoptysis. No dyspnea.  CARDIOVASCULAR: No chest pain. No orthopnea. No palpitations. No syncope.  GASTROINTESTINAL: No nausea, no vomiting or diarrhea. No abdominal pain. No melena or hematochezia.  GENITOURINARY:  No urgency. No frequency. No dysuria. No hematuria. No obstructive symptoms. No discharge. No pain. No significant abnormal bleeding ENDOCRINE: No polyuria or nocturia. No heat or cold intolerance.  HEMATOLOGY: No anemia. No bruising. No bleeding. No purpura. No petechiae INTEGUMENTARY: No rashes. No lesions.  MUSCULOSKELETAL: No arthritis. No swelling. No gout.  NEUROLOGIC: No numbness, tingling, or ataxia. No seizure-type activity.  PSYCHIATRIC: No anxiety. No insomnia. No ADD.     Vitals:   Vitals:   11/04/17 1500 11/04/17 1600 11/04/17 1653 11/04/17 1700  BP: (!) 145/104 (!) 170/109 (!) 177/119 (!) 158/106  Pulse: 87 89  80  Resp: (!) 29 (!) 25  (!) 26  Temp:  99.6 F (37.6 C)    TempSrc:  Oral    SpO2: 93% 93%  92%  Weight:      Height:        Wt Readings from Last 3 Encounters:  11/04/17 86.2 kg (190 lb 0.6 oz)     Intake/Output Summary (Last 24 hours) at 11/04/2017 1817 Last data filed at  11/04/2017 1600 Gross per 24 hour  Intake 1548.56 ml  Output 4590 ml  Net -3041.44 ml    Physical Exam:   GENERAL: Chronically ill HEAD, EYES, EARS, NOSE AND THROAT: Atraumatic, normocephalic.  Significant tongue swelling pupils equal and reactive to light. Sclerae anicteric. No conjunctival injection. No oro-pharyngeal erythema.  NECK: Supple. There is no jugular venous distention. No bruits, no lymphadenopathy, no thyromegaly.  HEART: Regular rate and rhythm,. No murmurs, no rubs, no clicks.  LUNGS: On the vent.  ABDOMEN: Soft, flat, nontender, nondistended. Has good bowel sounds. No hepatosplenomegaly appreciated.  EXTREMITIES: No evidence  of any cyanosis, clubbing, or peripheral edema.  +2 pedal and radial pulses bilaterally.  NEUROLOGIC: The patient is alert, awake, and oriented x3 with no focal motor or sensory deficits appreciated bilaterally.  SKIN: Moist and warm with no rashes appreciated.  Psych: Not anxious, depressed LN: No inguinal LN enlargement    Antibiotics   Anti-infectives (From admission, onward)   None      Medications   Scheduled Meds: . chlorhexidine gluconate (MEDLINE KIT)  15 mL Mouth Rinse BID  . cloNIDine  0.3 mg Transdermal Weekly  . enoxaparin (LOVENOX) injection  40 mg Subcutaneous Q24H  . mouth rinse  15 mL Mouth Rinse 10 times per day  . protein supplement shake  2 oz Oral TID WC   Continuous Infusions: . dexmedetomidine (PRECEDEX) IV infusion 1 mcg/kg/hr (11/04/17 1634)  . lactated ringers with kcl 100 mL/hr at 11/04/17 1600   PRN Meds:.bisacodyl, fentaNYL (SUBLIMAZE) injection, hydrALAZINE, ipratropium-albuterol, LORazepam, metoprolol tartrate, ondansetron (ZOFRAN) IV, sennosides   Data Review:   Micro Results Recent Results (from the past 240 hour(s))  MRSA PCR Screening     Status: None   Collection Time: 10/25/2017 11:38 PM  Result Value Ref Range Status   MRSA by PCR NEGATIVE NEGATIVE Final    Comment:        The GeneXpert MRSA  Assay (FDA approved for NASAL specimens only), is one component of a comprehensive MRSA colonization surveillance program. It is not intended to diagnose MRSA infection nor to guide or monitor treatment for MRSA infections. Performed at [facility], [address] [place], [place] [address]     Radiology Reports US Renal  Result Date: 11/01/2017 CLINICAL DATA:  63 year old male with urinary catheter in place. Unable to void. Initial encounter. EXAM: RENAL / URINARY TRACT ULTRASOUND COMPLETE COMPARISON:  None. FINDINGS: Right Kidney: Length: 10.3 cm. Echogenicity within normal limits. No hydronephrosis. Lower pole 1.3 x 1.3 x 1.4 cm cyst. Left Kidney: Length: 11 cm. Echogenicity within normal limits. No hydronephrosis. Mid aspect 3.1 x 2.8 x 3.1 cm cyst and 1.9 x 2.1 x 1.9 cm cyst. Bladder: Decompressed by Foley catheter. IMPRESSION: No hydronephrosis. Bilateral renal cysts. Bladder decompressed by Foley catheter. Electronically Signed   By: Genia Del M.D.   On: 11/01/2017 16:17   Dg Chest Port 1 View  Result Date: 11/04/2017 CLINICAL DATA:  Respiratory failure EXAM: PORTABLE CHEST 1 VIEW COMPARISON:  11/03/2017 FINDINGS: Endotracheal tube is been removed. Cardiac shadow is within normal limits. Right basilar atelectatic changes are noted. The lungs are otherwise clear. No effusion or pneumothorax is noted. No bony abnormality is seen. IMPRESSION: Mild right basilar atelectasis. Electronically Signed   By: Inez Catalina M.D.   On: 11/04/2017 09:26   Dg Chest Port 1 View  Result Date: 11/03/2017 CLINICAL DATA:  Respiratory failure. History of hypertension. Nonsmoker. EXAM: PORTABLE CHEST 1 VIEW COMPARISON:  Chest x-ray of November 01, 2017 FINDINGS: The lungs are adequately inflated. There is hazy increased density at the lung bases which is new. The heart is top-normal in size. The pulmonary vascularity is normal. The endotracheal tube tip lies approximately 3 cm above the carina.  The bony thorax is unremarkable. IMPRESSION: Interval development of bibasilar atelectasis. Probable small posterior layering pleural fluid collection on the right. The endotracheal tube is in reasonable position. Electronically Signed   By: David  Martinique M.D.   On: 11/03/2017 10:54   Portable Chest Xray  Result Date: 11/01/2017 CLINICAL DATA:  ETT present EXAM: PORTABLE  CHEST 1 VIEW COMPARISON:  11/18/2017 FINDINGS: ET tube has been withdrawn slightly, 3.4 cm above carina, satisfactory position. Redemonstration of cardiomegaly with calcified tortuous aorta. No consolidation or edema. IMPRESSION: Satisfactory ETT placement.  No active chest disease. Electronically Signed   By: Staci Righter M.D.   On: 11/01/2017 07:25   Portable Chest X-ray  Result Date: 11/01/2017 CLINICAL DATA:  Endotracheal tube placement EXAM: PORTABLE CHEST 1 VIEW COMPARISON:  None. FINDINGS: Endotracheal tube tip is low, measuring about 1.8 cm above the carina. Consider retracting about 1 cm. Shallow inspiration. Heart size and pulmonary vascularity are normal for technique. No airspace disease or consolidation in the lungs. No blunting of costophrenic angles. No pneumothorax. IMPRESSION: Endotracheal tube tip measures 1.8 cm above the carina. Shallow inspiration. No active pulmonary disease. Electronically Signed   By: Lucienne Capers M.D.   On: 11/01/2017 00:30   Dg Abd Portable 1v  Result Date: 11/01/2017 CLINICAL DATA:  62 y/o  M; NG tube placement. EXAM: PORTABLE ABDOMEN - 1 VIEW COMPARISON:  None. FINDINGS: Enteric tube tip not visualized in the lower chest or abdomen, readjustment recommended. Clear lung bases. Prominent loops of small bowel in the abdomen may represent obstruction or ileus. IMPRESSION: 1. Enteric tube not visualized, readjustment recommended. 2. Prominent loops of small bowel in the abdomen may represent obstruction or ileus. These results will be called to the ordering clinician or representative by the  Radiologist Assistant, and communication documented in the PACS or zVision Dashboard. Electronically Signed   By: Kristine Garbe M.D.   On: 11/01/2017 01:07     CBC Recent Labs  Lab 10/27/2017 2112 11/02/17 0218 11/04/17 0630  WBC 5.7 8.6 10.4  HGB 14.6 13.0 13.2  HCT 41.4 36.4* 36.9*  PLT 216 172 143*  MCV 91.9 92.0 92.3  MCH 32.4 32.9 32.9  MCHC 35.2 35.7 35.7  RDW 13.3 13.7 13.5    Chemistries  Recent Labs  Lab 11/22/2017 2112  11/01/17 1625 11/02/17 0218 11/02/17 1300 11/03/17 0535 11/04/17 0630  NA 138   < > 139 138 137 139 142  K 2.7*   < > 3.4* 3.7 3.5 3.9 2.5*  CL 98*   < > 101 100* 99* 100* 101  CO2 26   < > _0 GLUCOSE 85   < > 144* 134* 123* 126* 138*  BUN 15   < > 27* 39* 50* 67* 62*  CREATININE 0.99   < > 2.74* 4.03* 4.81* 5.33* 2.80*  CALCIUM 9.3   < > 8.3* 8.1* 7.8* 7.6* 8.8*  MG 1.7  --   --   --   --   --   --   AST 45*  --   --   --   --   --  26  ALT 37  --   --   --   --   --  26  ALKPHOS 69  --   --   --   --   --  61  BILITOT 0.9  --   --   --   --   --  1.4*   < > = values in this interval not displayed.   ------------------------------------------------------------------------------------------------------------------ estimated creatinine clearance is 30.5 mL/min (A) (by C-G formula based on SCr of 2.8 mg/dL (H)). ------------------------------------------------------------------------------------------------------------------ No results for input(s): HGBA1C in the last 72 hours. ------------------------------------------------------------------------------------------------------------------ No results for input(s): CHOL, HDL, LDLCALC, TRIG, CHOLHDL, LDLDIRECT in the last 72 hours. ------------------------------------------------------------------------------------------------------------------ No  results for input(s): TSH, T4TOTAL, T3FREE, THYROIDAB in the last 72 hours.  Invalid input(s):  FREET3 ------------------------------------------------------------------------------------------------------------------ No results for input(s): VITAMINB12, FOLATE, FERRITIN, TIBC, IRON, RETICCTPCT in the last 72 hours.  Coagulation profile Recent Labs  Lab 11/01/17 0041  INR 0.99    No results for input(s): DDIMER in the last 72 hours.  Cardiac Enzymes Recent Labs  Lab 11/01/17 1625 11/02/17 0218 11/02/17 0839  TROPONINI 0.06* 0.06* 0.05*   ------------------------------------------------------------------------------------------------------------------ Invalid input(s): POCBNP    Assessment & Plan  Patient is a 62 year old African-American male admitted with angioedema and required emergent intubation  #1 angioedema -now resolved supportive care Continue Decadron and Pepcid swelling much improved, extubation per pulmonary #2  Acute respiratory failure with hypoxia -due to his angioedema and obstruction of his airway, status post extubation #3   Acute renal failure with oliguria nephrology is following renal function much improved #4 HTN (hypertension) continue to monitor blood pressure      Code Status Orders  (From admission, onward)        Start     Ordered   11/02/2017 2322  Full code  Continuous     11/01/2017 2321    Code Status History    This patient has a current code status but no historical code status.           Consults intensive  DVT Prophylaxis  Lovenox   Lab Results  Component Value Date   PLT 143 (L) 11/04/2017     Time Spent in minutes   35 minutes  Greater than 50% of time spent in care coordination and counseling patient regarding the condition and plan of care.   Dustin Flock M.D on 11/04/2017 at 6:17 PM  Between 7am to 6pm - Pager - [phone]  After 6pm go to [email or web address] - Proofreader  [employer]   Office  [phone]

## 2017-11-05 LAB — GLUCOSE, CAPILLARY
Glucose-Capillary: 148 mg/dL — ABNORMAL HIGH (ref 65–99)
Glucose-Capillary: 148 mg/dL — ABNORMAL HIGH (ref 65–99)

## 2017-11-05 LAB — BASIC METABOLIC PANEL
ANION GAP: 9 (ref 5–15)
BUN: 67 mg/dL — ABNORMAL HIGH (ref 6–20)
CALCIUM: 8.9 mg/dL (ref 8.9–10.3)
CO2: 28 mmol/L (ref 22–32)
CREATININE: 1.87 mg/dL — AB (ref 0.61–1.24)
Chloride: 108 mmol/L (ref 101–111)
GFR, EST AFRICAN AMERICAN: 42 mL/min — AB (ref >60)
GFR, EST NON AFRICAN AMERICAN: 37 mL/min — AB (ref >60)
Glucose, Bld: 159 mg/dL — ABNORMAL HIGH (ref 65–99)
Potassium: 3.5 mmol/L (ref 3.5–5.1)
Sodium: 145 mmol/L (ref 135–145)

## 2017-11-05 LAB — CBC
HCT: 36.4 % — ABNORMAL LOW (ref 40.0–52.0)
HEMOGLOBIN: 12.7 g/dL — AB (ref 13.0–18.0)
MCH: 32.4 pg (ref 26.0–34.0)
MCHC: 35 g/dL (ref 32.0–36.0)
MCV: 92.8 fL (ref 80.0–100.0)
PLATELETS: 155 10*3/uL (ref 150–440)
RBC: 3.92 MIL/uL — AB (ref 4.40–5.90)
RDW: 13.3 % (ref 11.5–14.5)
WBC: 9.2 10*3/uL (ref 3.8–10.6)

## 2017-11-05 LAB — MAGNESIUM: MAGNESIUM: 2.1 mg/dL (ref 1.7–2.4)

## 2017-11-05 MED ORDER — ORAL CARE MOUTH RINSE
15.0000 mL | Freq: Two times a day (BID) | OROMUCOSAL | Status: DC
  Administered 2017-11-05 – 2017-11-07 (×7): 15 mL via OROMUCOSAL

## 2017-11-05 MED ORDER — THIAMINE HCL 100 MG/ML IJ SOLN
Freq: Every morning | INTRAVENOUS | Status: AC
  Administered 2017-11-05 – 2017-11-07 (×3): via INTRAVENOUS
  Filled 2017-11-05 (×4): qty 1000

## 2017-11-05 MED ORDER — POTASSIUM CHLORIDE 2 MEQ/ML IV SOLN
INTRAVENOUS | Status: DC
  Administered 2017-11-05 – 2017-11-06 (×2): via INTRAVENOUS
  Filled 2017-11-05 (×5): qty 1000

## 2017-11-05 NOTE — Progress Notes (Signed)
   11/05/17 1305  Clinical Encounter Type  Visited With Patient and family together  Visit Type Initial   Chaplain introductory visit.  Family indicated that they had no needs at present.  Chaplain encouraged them to have staff contact chaplain if needs changed.

## 2017-11-05 NOTE — Progress Notes (Signed)
[facility] [place] [facility]                                                                                                                                                                                  Patient Demographics   Dustin Orozco, is a 62 y.o. male, DOB - 05/14/1956, MRN:[id number]  Admit date - 10/28/2017   Admitting Physician David Willis, MD  Outpatient Primary MD for the patient is [facility]   LOS - 5  Subjective:  Patient on Precedex drip and sleepy  Review of Systems:   CONSTITUTIONAL: Patient sleepy    Vitals:   Vitals:   11/05/17 1500 11/05/17 1559 11/05/17 1600 11/05/17 1629  BP: (!) 167/96 (!) 167/124 (!) 167/101 (!) 133/96  Pulse: 73  65   Resp: (!) 23  15   Temp:      TempSrc:      SpO2: 94%  (!) 89%   Weight:      Height:        Wt Readings from Last 3 Encounters:  11/05/17 83 kg (182 lb 15.7 oz)     Intake/Output Summary (Last 24 hours) at 11/05/2017 1713 Last data filed at 11/05/2017 1232 Gross per 24 hour  Intake 2477.33 ml  Output 895 ml  Net 1582.33 ml    Physical Exam:   GENERAL: Critically ill HEAD, EYES, EARS, NOSE AND THROAT: Atraumatic, normocephalic.  Significant tongue swelling pupils equal and reactive to light. Sclerae anicteric. No conjunctival injection. No oro-pharyngeal erythema.  NECK: Supple. There is no jugular venous distention. No bruits, no lymphadenopathy, no thyromegaly.  HEART: Regular rate and rhythm,. No murmurs, no rubs, no clicks.  LUNGS: On the vent.  ABDOMEN: Soft, flat, nontender, nondistended. Has good bowel sounds. No hepatosplenomegaly appreciated.  EXTREMITIES: No evidence of any cyanosis, clubbing, or peripheral edema.  +2 pedal and radial pulses bilaterally.  NEUROLOGIC: Drowsy SKIN: Moist and warm with no rashes appreciated.  Psych: Drowsy LN: No inguinal LN enlargement    Antibiotics   Anti-infectives (From admission, onward)   None       Medications   Scheduled Meds: . chlorhexidine gluconate (MEDLINE KIT)  15 mL Mouth Rinse BID  . cloNIDine  0.3 mg Transdermal Weekly  . enoxaparin (LOVENOX) injection  40 mg Subcutaneous Q24H  . mouth rinse  15 mL Mouth Rinse q12n4p  . protein supplement shake  2 oz Oral TID WC   Continuous Infusions: . dexmedetomidine (PRECEDEX) IV infusion 0.6 mcg/kg/hr (11/05/17 1653)  . dextrose 5 % lactated ringers with kcl    . banana bag IV 1000 mL 75 mL/hr at 11/05/17 1229   PRN   Meds:.bisacodyl, fentaNYL (SUBLIMAZE) injection, hydrALAZINE, ipratropium-albuterol, LORazepam, metoprolol tartrate, ondansetron (ZOFRAN) IV, sennosides   Data Review:   Micro Results Recent Results (from the past 240 hour(s))  MRSA PCR Screening     Status: None   Collection Time: 11/13/2017 11:38 PM  Result Value Ref Range Status   MRSA by PCR NEGATIVE NEGATIVE Final    Comment:        The GeneXpert MRSA Assay (FDA approved for NASAL specimens only), is one component of a comprehensive MRSA colonization surveillance program. It is not intended to diagnose MRSA infection nor to guide or monitor treatment for MRSA infections. Performed at [facility], [address] [place], [place] [address]     Radiology Reports US Renal  Result Date: 11/01/2017 CLINICAL DATA:  62 year old male with urinary catheter in place. Unable to void. Initial encounter. EXAM: RENAL / URINARY TRACT ULTRASOUND COMPLETE COMPARISON:  None. FINDINGS: Right Kidney: Length: 10.3 cm. Echogenicity within normal limits. No hydronephrosis. Lower pole 1.3 x 1.3 x 1.4 cm cyst. Left Kidney: Length: 11 cm. Echogenicity within normal limits. No hydronephrosis. Mid aspect 3.1 x 2.8 x 3.1 cm cyst and 1.9 x 2.1 x 1.9 cm cyst. Bladder: Decompressed by Foley catheter. IMPRESSION: No hydronephrosis. Bilateral renal cysts. Bladder decompressed by Foley catheter. Electronically Signed   By: Genia Del M.D.   On: 11/01/2017 16:17   Dg  Chest Port 1 View  Result Date: 11/04/2017 CLINICAL DATA:  Respiratory failure EXAM: PORTABLE CHEST 1 VIEW COMPARISON:  11/03/2017 FINDINGS: Endotracheal tube is been removed. Cardiac shadow is within normal limits. Right basilar atelectatic changes are noted. The lungs are otherwise clear. No effusion or pneumothorax is noted. No bony abnormality is seen. IMPRESSION: Mild right basilar atelectasis. Electronically Signed   By: Inez Catalina M.D.   On: 11/04/2017 09:26   Dg Chest Port 1 View  Result Date: 11/03/2017 CLINICAL DATA:  Respiratory failure. History of hypertension. Nonsmoker. EXAM: PORTABLE CHEST 1 VIEW COMPARISON:  Chest x-ray of November 01, 2017 FINDINGS: The lungs are adequately inflated. There is hazy increased density at the lung bases which is new. The heart is top-normal in size. The pulmonary vascularity is normal. The endotracheal tube tip lies approximately 3 cm above the carina. The bony thorax is unremarkable. IMPRESSION: Interval development of bibasilar atelectasis. Probable small posterior layering pleural fluid collection on the right. The endotracheal tube is in reasonable position. Electronically Signed   By: David  Martinique M.D.   On: 11/03/2017 10:54   Portable Chest Xray  Result Date: 11/01/2017 CLINICAL DATA:  ETT present EXAM: PORTABLE CHEST 1 VIEW COMPARISON:  10/30/2017 FINDINGS: ET tube has been withdrawn slightly, 3.4 cm above carina, satisfactory position. Redemonstration of cardiomegaly with calcified tortuous aorta. No consolidation or edema. IMPRESSION: Satisfactory ETT placement.  No active chest disease. Electronically Signed   By: Staci Righter M.D.   On: 11/01/2017 07:25   Portable Chest X-ray  Result Date: 11/01/2017 CLINICAL DATA:  Endotracheal tube placement EXAM: PORTABLE CHEST 1 VIEW COMPARISON:  None. FINDINGS: Endotracheal tube tip is low, measuring about 1.8 cm above the carina. Consider retracting about 1 cm. Shallow inspiration. Heart size and pulmonary  vascularity are normal for technique. No airspace disease or consolidation in the lungs. No blunting of costophrenic angles. No pneumothorax. IMPRESSION: Endotracheal tube tip measures 1.8 cm above the carina. Shallow inspiration. No active pulmonary disease. Electronically Signed   By: Lucienne Capers M.D.   On: 11/01/2017 00:30   Dg Abd Portable  1v  Result Date: 11/01/2017 CLINICAL DATA:  62 y/o  M; NG tube placement. EXAM: PORTABLE ABDOMEN - 1 VIEW COMPARISON:  None. FINDINGS: Enteric tube tip not visualized in the lower chest or abdomen, readjustment recommended. Clear lung bases. Prominent loops of small bowel in the abdomen may represent obstruction or ileus. IMPRESSION: 1. Enteric tube not visualized, readjustment recommended. 2. Prominent loops of small bowel in the abdomen may represent obstruction or ileus. These results will be called to the ordering clinician or representative by the Radiologist Assistant, and communication documented in the PACS or zVision Dashboard. Electronically Signed   By: Kristine Garbe M.D.   On: 11/01/2017 01:07     CBC Recent Labs  Lab 10/29/2017 2112 11/02/17 0218 11/04/17 0630 11/05/17 0528  WBC 5.7 8.6 10.4 9.2  HGB 14.6 13.0 13.2 12.7*  HCT 41.4 36.4* 36.9* 36.4*  PLT 216 172 143* 155  MCV 91.9 92.0 92.3 92.8  MCH 32.4 32.9 32.9 32.4  MCHC 35.2 35.7 35.7 35.0  RDW 13.3 13.7 13.5 13.3    Chemistries  Recent Labs  Lab 11/19/2017 2112  11/02/17 0218 11/02/17 1300 11/03/17 0535 11/04/17 0630 11/04/17 2141 11/05/17 0528  NA 138   < > 138 137 139 142  --  145  K 2.7*   < > 3.7 3.5 3.9 2.5* 2.8* 3.5  CL 98*   < > 100* 99* 100* 101  --  108  CO2 26   < > _0 --  28  GLUCOSE 85   < > 134* 123* 126* 138*  --  159*  BUN 15   < > 39* 50* 67* 62*  --  67*  CREATININE 0.99   < > 4.03* 4.81* 5.33* 2.80*  --  1.87*  CALCIUM 9.3   < > 8.1* 7.8* 7.6* 8.8*  --  8.9  MG 1.7  --   --   --   --   --   --  2.1  AST 45*  --   --   --   --   26  --   --   ALT 37  --   --   --   --  26  --   --   ALKPHOS 69  --   --   --   --  61  --   --   BILITOT 0.9  --   --   --   --  1.4*  --   --    < > = values in this interval not displayed.   ------------------------------------------------------------------------------------------------------------------ estimated creatinine clearance is 45.7 mL/min (A) (by C-G formula based on SCr of 1.87 mg/dL (H)). ------------------------------------------------------------------------------------------------------------------ No results for input(s): HGBA1C in the last 72 hours. ------------------------------------------------------------------------------------------------------------------ No results for input(s): CHOL, HDL, LDLCALC, TRIG, CHOLHDL, LDLDIRECT in the last 72 hours. ------------------------------------------------------------------------------------------------------------------ No results for input(s): TSH, T4TOTAL, T3FREE, THYROIDAB in the last 72 hours.  Invalid input(s): FREET3 ------------------------------------------------------------------------------------------------------------------ No results for input(s): VITAMINB12, FOLATE, FERRITIN, TIBC, IRON, RETICCTPCT in the last 72 hours.  Coagulation profile Recent Labs  Lab 11/01/17 0041  INR 0.99    No results for input(s): DDIMER in the last 72 hours.  Cardiac Enzymes Recent Labs  Lab 11/01/17 1625 11/02/17 0218 11/02/17 0839  TROPONINI 0.06* 0.06* 0.05*   ------------------------------------------------------------------------------------------------------------------ Invalid input(s): POCBNP    Assessment & Plan  Patient is a 62 year old African-American male admitted with angioedema and required emergent intubation  #1 angioedema -now resolved supportive care  Steroids have been discontinued #2  Acute respiratory failure with hypoxia -due to his angioedema and obstruction of his airway, status post  extubation #3 acute encephalopathy etiology unclear continue Precedex #4 accelerated hypertension continue clonidine, continue as needed metoprolol #5   Acute renal failure with oliguria nephrology is following renal function  improved       Code Status Orders  (From admission, onward)        Start     Ordered   10/27/2017 2322  Full code  Continuous     11/09/2017 2321    Code Status History    This patient has a current code status but no historical code status.           Consults intensive  DVT Prophylaxis  Lovenox   Lab Results  Component Value Date   PLT 155 11/05/2017     Time Spent in minutes   35 minutes  Greater than 50% of time spent in care coordination and counseling patient regarding the condition and plan of care.   Dustin Flock M.D on 11/05/2017 at 5:13 PM  Between 7am to 6pm - Pager - [phone]  After 6pm go to [email or web address] - Proofreader  [facility]   Office  [phone]

## 2017-11-05 NOTE — Progress Notes (Signed)
Pt broke Bi-PAP mask.  Pt placed on 6L [place]. Precedex gtt increased to 241mcg/klg/min.  Pt currently resting comfortably on the mask.

## 2017-11-05 NOTE — Progress Notes (Signed)
Pharmacy Electrolyte Monitoring Consult:  Pharmacy consulted to assist in monitoring and replacing electrolytes in this 62 y.o. male admitted on 11/04/2017 with angioedema now with concern for alcohol withdrawal.   Patient ordered Banana bag daily x 3 days and D5/LR with 40mEq of Potassium at 5575mL/hr. MIVF to be infusing when banana bag is not infusing.   Labs:  Sodium (mmol/L)  Date Value  11/05/2017 145   Potassium (mmol/L)  Date Value  11/05/2017 3.5   Magnesium (mg/dL)  Date Value  13/08/657804/06/2018 2.1   Phosphorus (mg/dL)  Date Value  46/96/295204/04/2018 11.6 (H)   Calcium (mg/dL)  Date Value  84/13/244004/06/2018 8.9   Albumin (g/dL)  Date Value  10/27/253604/05/2018 3.3 (L)    Plan:  No further replacement warranted at this time. Will recheck electrolytes with am labs on 4/13.   Pharmacy will continue to monitor and adjust per consult.   Simpson,Michael L 11/05/2017 5:14 PM

## 2017-11-05 NOTE — Progress Notes (Signed)
PULMONARY / CRITICAL CARE MEDICINE   Name: Dustin Orozco MRN: [id number] DOB: July 05, 1956    ADMISSION DATE:  10/29/2017  PT PROFILE:   [age] M on chronic ACE inhibitor therapy, nasally intubated intubated by ENT in ED for angioedema with severe macroglossia and lip swelling.  MAJOR EVENTS/TEST RESULTS: 04/08 developed anuric renal failure 04/08 Renal US: no hydro, bladder decompressed 04/09 Renal consultation: "Acute renal failure is likely secondary to severe ATN likely from hypotension.  Renal ultrasound is negative for obstruction" 04/10 Extubated. Tolerated well but requiring [place] O2 @ 6 LPM 04/11 tolerating extubation well.  However, increasing agitated delirium.  Wife reports heavy chronic alcohol use.  Lorazepam initiated.  Persistent agitation.  Dexmedetomidine infusion initiated. 4/12 BIPAP initiated las night  INDWELLING DEVICES:: ETT (L naris) 04/07 >> 04/10  MICRO DATA: MRSA PCR 04/07 >> NEG  ANTIMICROBIALS:     SUBJECTIVE:  Agitated delirium.  Suspected alcohol withdrawal.  Improved after initiation of dexmedetomidine.  No overt respiratory distress.  Strong cough.  Not following commands.  VITAL SIGNS: BP (!) 157/100   Pulse (!) 55   Temp 97.9 F (36.6 C) (Axillary)   Resp (!) 24   Ht 6\' 1"  (1.854 m)   Wt 182 lb 15.7 oz (83 kg)   SpO2 90%   BMI 24.14 kg/m   HEMODYNAMICS:    VENTILATOR SETTINGS: FiO2 (%):  [50 %] 50 %  INTAKE / OUTPUT: I/O last 3 completed shifts: In: 3334.8 [I.V.:2924.8; Other:10; IV Piggyback:400] Out: 5485 [Urine:5485]  PHYSICAL EXAMINATION: General: RASS -1 to +1, not F/C.  On BIPAP Neuro: No focal neurological deficits, did not follow commands HEENT: NCAT, sclerae white Cardiovascular: RRR, no M Lungs: Few scattered rhonchi bilaterally Abdomen: Soft, NT, NABS Extremities: Warm, no edema  LABS:  BMET Recent Labs  Lab 11/03/17 0535 11/04/17 0630 11/04/17 2141 11/05/17 0528  NA 139 142  --  145  K 3.9 2.5* 2.8* 3.5  CL  100* 101  --  108  CO2 25 27  --  28  BUN 67* 62*  --  67*  CREATININE 5.33* 2.80*  --  1.87*  GLUCOSE 126* 138*  --  159*    Electrolytes Recent Labs  Lab 10/30/2017 2112  11/03/17 0535 11/04/17 0630 11/05/17 0528  CALCIUM 9.3   < > 7.6* 8.8* 8.9  MG 1.7  --   --   --  2.1  PHOS 3.1  --  11.6*  --   --    < > = values in this interval not displayed.    CBC Recent Labs  Lab 11/02/17 0218 11/04/17 0630 11/05/17 0528  WBC 8.6 10.4 9.2  HGB 13.0 13.2 12.7*  HCT 36.4* 36.9* 36.4*  PLT 172 143* 155    Coag's Recent Labs  Lab 11/01/17 0041  INR 0.99    Sepsis Markers Recent Labs  Lab 10/30/2017 2112  PROCALCITON <0.10    ABG Recent Labs  Lab 11/20/2017 2355  PHART 7.26*  PCO2ART 63*  PO2ART 95    Liver Enzymes Recent Labs  Lab 10/29/2017 2112 11/03/17 0535 11/04/17 0630  AST 45*  --  26  ALT 37  --  26  ALKPHOS 69  --  61  BILITOT 0.9  --  1.4*  ALBUMIN 4.7 3.3* 3.3*    Cardiac Enzymes Recent Labs  Lab 11/01/17 1625 11/02/17 0218 11/02/17 0839  TROPONINI 0.06* 0.06* 0.05*    Glucose Recent Labs  Lab 11/23/2017 2337 11/04/17 1610 11/05/17 0756  GLUCAP 102* 142* 148*    CXR: Mild vascular congestion, mild RLL atelectasis    ASSESSMENT / PLAN:  PULMONARY A: VDRF, resolved Angioedema, UAO-resolved  Acute hypoxemic respiratory failure- improved P:   Continue to monitor as SDU status Continue supplemental oxygen as needed to maintain SPO2 >90% Continue nebulized bronchodilators as needed Liberate from BIPAP as able  CARDIOVASCULAR A:  Chronic hypertension, well controlled P:  Continue clonidine patch Continue hydralazine as needed  RENAL A:   AKI, oliguric Mild hypervolemia Hypokalemia P:   Monitor BMET intermittently Monitor I/Os Correct electrolytes as indicated -KCl per on 04/11 Continue maintenance IVF's -adjusted 04/11 Electrolytes replacemnet  GASTROINTESTINAL A:   Dysphagia due to AMS P:   SUP: N/I  postextubation NPO for now, consider SLP evaluation when cognition permits, May need enteral nutrition  HEMATOLOGIC A:   Very mild thrombocytopenia P:  DVT px: Enoxaparin Monitor CBC intermittently Transfuse per usual guidelines   INFECTIOUS A:   No acute issues P:   Monitor temp, WBC count Micro and abx as above   ENDOCRINE A:   Mild hyperglycemia without prior history of DM P:   CBGs q 8 hrs Consider SSI for glucose >180  NEUROLOGIC A:   Lethargy, resolved History of heavy alcohol use Agitated delirium -suspect alcohol withdrawal syndrome with delirium Added thiamine, Folic acid and MVI. Will consider nutrition tomorrow. P:   RASS goal: 0 Dexmedetomidine infusion initiated 04/11 Lorazepam as needed initiated 04/11  Family:  Sister updated. Jackson Latino, MD PCCM service Mobile [phone] Pager [phone] 11/05/2017, 8:15 AM

## 2017-11-05 NOTE — Progress Notes (Signed)
Pt labored with purse lipped breathing.  Pt placed on BIPAP by RT.

## 2017-11-06 ENCOUNTER — Inpatient Hospital Stay: Payer: Medicare Other

## 2017-11-06 DIAGNOSIS — E870 Hyperosmolality and hypernatremia: Secondary | ICD-10-CM

## 2017-11-06 LAB — RENAL FUNCTION PANEL
ANION GAP: 7 (ref 5–15)
Albumin: 2.7 g/dL — ABNORMAL LOW (ref 3.5–5.0)
BUN: 86 mg/dL — ABNORMAL HIGH (ref 6–20)
CHLORIDE: 114 mmol/L — AB (ref 101–111)
CO2: 28 mmol/L (ref 22–32)
CREATININE: 1.88 mg/dL — AB (ref 0.61–1.24)
Calcium: 8.8 mg/dL — ABNORMAL LOW (ref 8.9–10.3)
GFR calc non Af Amer: 36 mL/min — ABNORMAL LOW (ref >60)
GFR, EST AFRICAN AMERICAN: 42 mL/min — AB (ref >60)
GLUCOSE: 181 mg/dL — AB (ref 65–99)
Phosphorus: 2.5 mg/dL (ref 2.5–4.6)
Potassium: 3.4 mmol/L — ABNORMAL LOW (ref 3.5–5.1)
SODIUM: 149 mmol/L — AB (ref 135–145)

## 2017-11-06 LAB — GLUCOSE, CAPILLARY
GLUCOSE-CAPILLARY: 171 mg/dL — AB (ref 65–99)
GLUCOSE-CAPILLARY: 141 mg/dL — AB (ref 65–99)
Glucose-Capillary: 160 mg/dL — ABNORMAL HIGH (ref 65–99)
Glucose-Capillary: 171 mg/dL — ABNORMAL HIGH (ref 65–99)

## 2017-11-06 LAB — AMMONIA: AMMONIA: 29 umol/L (ref 9–35)

## 2017-11-06 LAB — MAGNESIUM: Magnesium: 2.3 mg/dL (ref 1.7–2.4)

## 2017-11-06 MED ORDER — VITAL HIGH PROTEIN PO LIQD
1000.0000 mL | ORAL | Status: DC
  Administered 2017-11-06: 1000 mL

## 2017-11-06 MED ORDER — POTASSIUM CHLORIDE 10 MEQ/100ML IV SOLN
10.0000 meq | INTRAVENOUS | Status: AC
  Administered 2017-11-06 (×4): 10 meq via INTRAVENOUS
  Filled 2017-11-06 (×4): qty 100

## 2017-11-06 MED ORDER — STERILE WATER FOR INJECTION IJ SOLN
INTRAMUSCULAR | Status: AC
  Administered 2017-11-06: 09:00:00
  Filled 2017-11-06: qty 10

## 2017-11-06 MED ORDER — HYDROCODONE-ACETAMINOPHEN 5-325 MG PO TABS
1.0000 | ORAL_TABLET | Freq: Once | ORAL | Status: AC
  Administered 2017-11-06: 1 via ORAL
  Filled 2017-11-06: qty 1

## 2017-11-06 MED ORDER — PRO-STAT SUGAR FREE PO LIQD
30.0000 mL | Freq: Two times a day (BID) | ORAL | Status: DC
  Administered 2017-11-06 – 2017-11-08 (×4): 30 mL

## 2017-11-06 MED ORDER — METOCLOPRAMIDE HCL 5 MG/ML IJ SOLN
10.0000 mg | Freq: Once | INTRAMUSCULAR | Status: DC
  Filled 2017-11-06: qty 2

## 2017-11-06 MED ORDER — PANTOPRAZOLE SODIUM 40 MG IV SOLR
40.0000 mg | Freq: Two times a day (BID) | INTRAVENOUS | Status: DC
  Administered 2017-11-06 – 2017-11-07 (×3): 40 mg via INTRAVENOUS
  Filled 2017-11-06 (×3): qty 40

## 2017-11-06 MED ORDER — FREE WATER
200.0000 mL | Freq: Three times a day (TID) | Status: DC
  Administered 2017-11-06 – 2017-11-07 (×2): 200 mL

## 2017-11-06 NOTE — Progress Notes (Signed)
Bipap on standby at bedside. Pt tolerating 3L [place] well.

## 2017-11-06 NOTE — Progress Notes (Signed)
Brief Nutrition Note  Consult received for enteral/tube feeding initiation and management.  Adult Enteral Nutrition Protocol initiated w/ Vital High protein at 40 cc/hr and Prostat BID.   RD to follow up tomorrow and adjust TF regimen as appropriate  Admitting Dx: Tongue swollen  Labs: Recent Labs  Lab 10/25/2017 2112  11/03/17 0535 11/04/17 0630 11/04/17 2141 11/05/17 0528 11/06/17 0426  NA 138   < > 139 142  --  145 149*  K 2.7*   < > 3.9 2.5* 2.8* 3.5 3.4*  CL 98*   < > 100* 101  --  108 114*  CO2 26   < > 25 27  --  28 28  BUN 15   < > 67* 62*  --  67* 86*  CREATININE 0.99   < > 5.33* 2.80*  --  1.87* 1.88*  CALCIUM 9.3   < > 7.6* 8.8*  --  8.9 8.8*  MG 1.7  --   --   --   --  2.1 2.3  PHOS 3.1  --  11.6*  --   --   --  2.5  GLUCOSE 85   < > 126* 138*  --  159* 181*   < > = values in this interval not displayed.   Christophe LouisNathan Franks RD, LDN, CNSC Clinical Nutrition Available Tues-Sat via Pager: [phone] 11/06/2017 10:24 AM

## 2017-11-06 NOTE — Progress Notes (Signed)
Pharmacy Electrolyte Monitoring Consult:  Pharmacy consulted to assist in monitoring and replacing electrolytes in this 62 y.o. male admitted on 10/26/2017 with angioedema now with concern for alcohol withdrawal.   Patient ordered Banana bag daily x 3 days and D5/LR with 40mEq of Potassium at 6575mL/hr. MIVF to be infusing when banana bag is not infusing.   Labs:  Sodium (mmol/L)  Date Value  11/06/2017 149 (H)   Potassium (mmol/L)  Date Value  11/06/2017 3.4 (L)   Magnesium (mg/dL)  Date Value  16/10/960404/13/2019 2.3   Phosphorus (mg/dL)  Date Value  54/09/811904/13/2019 2.5   Calcium (mg/dL)  Date Value  14/78/295604/13/2019 8.8 (L)   Albumin (g/dL)  Date Value  21/30/865704/13/2019 2.7 (L)   Assessment: K: 3.4, MD supplemented 40mEq IV. Continued on maintenance fluids as above.  Plan: No additional supplementation at this time, recheck electrolytes with AM labs.  Naiomy Watters C 11/06/2017 9:36 AM

## 2017-11-06 NOTE — Progress Notes (Signed)
Patient ID: Dustin Orozco, male   DOB: May 29, 1956, 62 y.o.   MRN: [id number]  [facility] PROGRESS NOTE  Dustin Orozco LOV:[id number] DOB: 1955-09-29 DOA: 10/28/2017 PCP: [facility] [place]  HPI/Subjective: Patient answer some yes/no questions.  Asking for some water.  Objective: Vitals:   11/06/17 1100 11/06/17 1200  BP: 131/87 (!) 141/87  Pulse: 65 65  Resp: (!) 26 (!) 23  Temp:    SpO2: 96% 97%    Filed Weights   11/04/17 0500 11/05/17 0400 11/06/17 0400  Weight: 86.2 kg (190 lb 0.6 oz) 83 kg (182 lb 15.7 oz) 85.3 kg (188 lb 0.8 oz)    ROS: Review of Systems  Respiratory: Negative for shortness of breath.   Cardiovascular: Negative for chest pain.  Gastrointestinal: Negative for abdominal pain.  Musculoskeletal: Negative for joint pain.   Exam: Physical Exam  Constitutional: He appears lethargic.  HENT:  Nose: No mucosal edema.  Mouth/Throat: No oropharyngeal exudate or posterior oropharyngeal edema.  Eyes: Pupils are equal, round, and reactive to light. Conjunctivae and lids are normal.  Neck: No JVD present. Carotid bruit is not present. No edema present. No thyroid mass and no thyromegaly present.  Cardiovascular: S1 normal and S2 normal. Exam reveals no gallop.  No murmur heard. Pulses:      Dorsalis pedis pulses are 2+ on the right side, and 2+ on the left side.  Respiratory: No respiratory distress. He has decreased breath sounds in the right lower field and the left lower field. He has no wheezes. He has no rhonchi. He has no rales.  GI: Soft. Bowel sounds are normal. There is no tenderness.  Musculoskeletal:       Right ankle: He exhibits no swelling.       Left ankle: He exhibits no swelling.  Lymphadenopathy:    He has no cervical adenopathy.  Neurological: He appears lethargic.  Patient able to follow commands and straight leg raise.  Skin: Skin is warm. No rash noted. Nails show no clubbing.  Psychiatric: His affect is blunt. He is slowed.       Data Reviewed: Basic Metabolic Panel: Recent Labs  Lab 10/27/2017 2112  11/02/17 1300 11/03/17 0535 11/04/17 0630 11/04/17 2141 11/05/17 0528 11/06/17 0426  NA 138   < > 137 139 142  --  145 149*  K 2.7*   < > 3.5 3.9 2.5* 2.8* 3.5 3.4*  CL 98*   < > 99* 100* 101  --  108 114*  CO2 26   < > _0 --  28 28  GLUCOSE 85   < > 123* 126* 138*  --  159* 181*  BUN 15   < > 50* 67* 62*  --  67* 86*  CREATININE 0.99   < > 4.81* 5.33* 2.80*  --  1.87* 1.88*  CALCIUM 9.3   < > 7.8* 7.6* 8.8*  --  8.9 8.8*  MG 1.7  --   --   --   --   --  2.1 2.3  PHOS 3.1  --   --  11.6*  --   --   --  2.5   < > = values in this interval not displayed.   Liver Function Tests: Recent Labs  Lab 11/11/2017 2112 11/03/17 0535 11/04/17 0630 11/06/17 0426  AST 45*  --  26  --   ALT 37  --  26  --   ALKPHOS 69  --  61  --  BILITOT 0.9  --  1.4*  --   PROT 8.4*  --  7.5  --   ALBUMIN 4.7 3.3* 3.3* 2.7*    Recent Labs  Lab 11/06/17 0426  AMMONIA 29   CBC: Recent Labs  Lab 11/08/2017 2112 11/02/17 0218 11/04/17 0630 11/05/17 0528  WBC 5.7 8.6 10.4 9.2  HGB 14.6 13.0 13.2 12.7*  HCT 41.4 36.4* 36.9* 36.4*  MCV 91.9 92.0 92.3 92.8  PLT 216 172 143* 155   Cardiac Enzymes: Recent Labs  Lab 11/01/17 1625 11/02/17 0218 11/02/17 0839  TROPONINI 0.06* 0.06* 0.05*   CBG: Recent Labs  Lab 11/04/17 1610 11/05/17 0756 11/05/17 1642 11/06/17 0031 11/06/17 0721  GLUCAP 142* 148* 148* 160* 171*    Recent Results (from the past 240 hour(s))  MRSA PCR Screening     Status: None   Collection Time: 11/19/2017 11:38 PM  Result Value Ref Range Status   MRSA by PCR NEGATIVE NEGATIVE Final    Comment:        The GeneXpert MRSA Assay (FDA approved for NASAL specimens only), is one component of a comprehensive MRSA colonization surveillance program. It is not intended to diagnose MRSA infection nor to guide or monitor treatment for MRSA infections. Performed at [facility], [address] [place], [place] [address]      Studies: Dg Abd 1 View  Result Date: 11/06/2017 CLINICAL DATA:  Nasogastric tube placement. EXAM: ABDOMEN - 1 VIEW COMPARISON:  None. FINDINGS: A nasogastric tube is seen with tip in the proximal stomach. No dilated bowel loops seen in upper abdomen, however lower abdomen is not visualized. Bibasilar pulmonary opacity noted, right side greater than left. IMPRESSION: Nasogastric tube tip in proximal stomach. Electronically Signed   By: Earle Gell M.D.   On: 11/06/2017 10:17    Scheduled Meds: . chlorhexidine gluconate (MEDLINE KIT)  15 mL Mouth Rinse BID  . cloNIDine  0.3 mg Transdermal Weekly  . enoxaparin (LOVENOX) injection  40 mg Subcutaneous Q24H  . feeding supplement (PRO-STAT SUGAR FREE 64)  30 mL Per Tube BID  . feeding supplement (VITAL HIGH PROTEIN)  1,000 mL Per Tube Q24H  . mouth rinse  15 mL Mouth Rinse q12n4p  . metoCLOPramide (REGLAN) injection  10 mg Intravenous Once  . pantoprazole (PROTONIX) IV  40 mg Intravenous Q12H   Continuous Infusions: . dexmedetomidine (PRECEDEX) IV infusion 1.044 mcg/kg/hr (11/06/17 1317)  . dextrose 5 % lactated ringers with kcl 75 mL/hr at 11/06/17 0600  . banana bag IV 1000 mL 75 mL/hr at 11/06/17 1040    Assessment/Plan:  1. Delirium tremens and alcohol withdrawal.  Patient is on Precedex drip.  Banana bag.  Watch closely in ICU stepdown while on Precedex drip. 2. Angioedema secondary to lisinopril.  This has improved with supportive care. 3. Acute hypoxic respiratory failure secondary to angioedema.  This has improved.  Patient extubated.  Still on nasal cannula at this point. 4. Accelerated hypertension on clonidine patch 5. Acute kidney injury.  Creatinine peaked at 5.33 and is down to 1.88 at this time.  Still not back to baseline at this point. 6. NG tube feeding  Code Status:     Code Status Orders  (From admission, onward)        Start     Ordered   10/27/2017 2322   Full code  Continuous     11/03/2017 2321    Code Status History    This patient has a current code  status but no historical code status.     Family Communication: Sister and daughter at the bedside Disposition Plan: To be determined  Consultants:  Critical care team  Time spent: 28 minutes  [place]

## 2017-11-06 NOTE — Progress Notes (Signed)
PULMONARY / CRITICAL CARE MEDICINE   Name: Dustin Orozco MRN: [id number] DOB: 12-21-1955    ADMISSION DATE:  11/16/2017  PT PROFILE:   [age] M on chronic ACE inhibitor therapy, nasally intubated intubated by ENT in ED for angioedema with severe macroglossia and lip swelling.  MAJOR EVENTS/TEST RESULTS: 04/08 developed anuric renal failure 04/08 Renal US: no hydro, bladder decompressed 04/09 Renal consultation: "Acute renal failure is likely secondary to severe ATN likely from hypotension.  Renal ultrasound is negative for obstruction" 04/10 Extubated. Tolerated well but requiring Gate O2 @ 6 LPM 04/11 tolerating extubation well.  However, increasing agitated delirium.  Wife reports heavy chronic alcohol use.  Lorazepam initiated.  Persistent agitation.  Dexmedetomidine infusion initiated. 4/12 BIPAP initiated las night  INDWELLING DEVICES:: ETT (L naris) 04/07 >> 04/10  MICRO DATA: MRSA PCR 04/07 >> NEG  ANTIMICROBIALS:     SUBJECTIVE:  Agitated delirium.  Suspected alcohol withdrawal.  Improved after initiation of dexmedetomidine.  No overt respiratory distress.  Strong cough. Follows simple commands intermittently  VITAL SIGNS: BP (!) 135/93   Pulse 76   Temp 99.1 F (37.3 C) (Axillary)   Resp 16   Ht 6\' 1"  (1.854 m)   Wt 188 lb 0.8 oz (85.3 kg)   SpO2 98%   BMI 24.81 kg/m   OXYGEN: 5 Liters     VENTILATOR SETTINGS: FiO2 (%):  [40 %] 40 % BIPAP for sleep  INTAKE / OUTPUT: I/O last 3 completed shifts: In: 4158.5 [I.V.:4058.5; IV Piggyback:100] Out: 1815 [Urine:1815]  PHYSICAL EXAMINATION: General: comfortable without distress Neuro: No focal neurological deficits, did follow simple command HEENT: NCAT, sclerae white Cardiovascular: RRR, no M Lungs: Clear bilaterally Abdomen: Soft, NT, NABS Extremities: Warm, no edema  LABS:  BMET Recent Labs  Lab 11/04/17 0630 11/04/17 2141 11/05/17 0528 11/06/17 0426  NA 142  --  145 149*  K 2.5* 2.8* 3.5 3.4*  CL  101  --  108 114*  CO2 27  --  28 28  BUN 62*  --  67* 86*  CREATININE 2.80*  --  1.87* 1.88*  GLUCOSE 138*  --  159* 181*    Electrolytes Recent Labs  Lab 11/21/2017 2112  11/03/17 0535 11/04/17 0630 11/05/17 0528 11/06/17 0426  CALCIUM 9.3   < > 7.6* 8.8* 8.9 8.8*  MG 1.7  --   --   --  2.1 2.3  PHOS 3.1  --  11.6*  --   --  2.5   < > = values in this interval not displayed.    CBC Recent Labs  Lab 11/02/17 0218 11/04/17 0630 11/05/17 0528  WBC 8.6 10.4 9.2  HGB 13.0 13.2 12.7*  HCT 36.4* 36.9* 36.4*  PLT 172 143* 155    Coag's Recent Labs  Lab 11/01/17 0041  INR 0.99    Sepsis Markers Recent Labs  Lab 10/25/2017 2112  PROCALCITON <0.10    ABG Recent Labs  Lab 10/28/2017 2355  PHART 7.26*  PCO2ART 63*  PO2ART 95    Liver Enzymes Recent Labs  Lab 11/08/2017 2112 11/03/17 0535 11/04/17 0630 11/06/17 0426  AST 45*  --  26  --   ALT 37  --  26  --   ALKPHOS 69  --  61  --   BILITOT 0.9  --  1.4*  --   ALBUMIN 4.7 3.3* 3.3* 2.7*    Cardiac Enzymes Recent Labs  Lab 11/01/17 1625 11/02/17 0218 11/02/17 0839  TROPONINI 0.06* 0.06* 0.05*  Glucose Recent Labs  Lab 11/04/17 1610 11/05/17 0756 11/05/17 1642 11/06/17 0031 11/06/17 0721 11/06/17 1531  GLUCAP 142* 148* 148* 160* 171* 141*    CXR: Mild vascular congestion, mild RLL atelectasis    ASSESSMENT / PLAN:  PULMONARY A: VDRF, resolved Angioedema, UAO-resolved  Acute hypoxemic respiratory failure- improved P:   Continue to monitor as SDU status Continue supplemental oxygen as needed to maintain SPO2 >90% Continue nebulized bronchodilators as needed BIPAP for sleep prn  CARDIOVASCULAR A:  Chronic hypertension, well controlled P:  Continue clonidine patch Continue hydralazine as needed  RENAL A:   AKI, oliguric Mild hypervolemia Hypokalemia Hypernatremia P:   Monitor BMET intermittently Monitor I/Os Correct electrolytes as indicated -KCl per on 04/11 Add  free water Electrolytes replacemnet  GASTROINTESTINAL A:   Dysphagia due to AMS P:   SUP: N/I postextubation NPO for now, consider SLP evaluation when cognition permits, NGT and start enteral nutrition  HEMATOLOGIC A:   Very mild thrombocytopenia P:  DVT px: Enoxaparin Monitor CBC intermittently Transfuse per usual guidelines   INFECTIOUS A:   No acute issues P:   Monitor temp, WBC count Micro and abx as above   ENDOCRINE A:   Mild hyperglycemia without prior history of DM P:   CBGs q 8 hrs Consider SSI for glucose >180  NEUROLOGIC A:   Lethargy, resolved History of heavy alcohol use Agitated delirium -suspect alcohol withdrawal syndrome with delirium Continue thiamine, Folic acid and MVI. P:   RASS goal: 0 Dexmedetomidine infusion initiated 04/11 Lorazepam as needed initiated 04/11  Family:  Sister updated.  Jackson Latino, MD PCCM service Mobile [phone] Pager [phone] 11/06/2017, 5:37 PM

## 2017-11-07 ENCOUNTER — Inpatient Hospital Stay: Payer: Medicare Other

## 2017-11-07 DIAGNOSIS — J189 Pneumonia, unspecified organism: Secondary | ICD-10-CM

## 2017-11-07 DIAGNOSIS — R7989 Other specified abnormal findings of blood chemistry: Secondary | ICD-10-CM

## 2017-11-07 LAB — BLOOD GAS, ARTERIAL
Acid-base deficit: 0.2 mmol/L (ref 0.0–2.0)
BICARBONATE: 24 mmol/L (ref 20.0–28.0)
FIO2: 0.4
O2 Saturation: 94.8 %
PATIENT TEMPERATURE: 37
PEEP/CPAP: 5 cmH2O
PH ART: 7.42 (ref 7.350–7.450)
RATE: 16 resp/min
VT: 500 mL
pCO2 arterial: 37 mmHg (ref 32.0–48.0)
pO2, Arterial: 73 mmHg — ABNORMAL LOW (ref 83.0–108.0)

## 2017-11-07 LAB — PHOSPHORUS
PHOSPHORUS: 3.8 mg/dL (ref 2.5–4.6)
Phosphorus: 1.5 mg/dL — ABNORMAL LOW (ref 2.5–4.6)

## 2017-11-07 LAB — BASIC METABOLIC PANEL
Anion gap: 8 (ref 5–15)
BUN: 102 mg/dL — ABNORMAL HIGH (ref 6–20)
CALCIUM: 8.5 mg/dL — AB (ref 8.9–10.3)
CO2: 24 mmol/L (ref 22–32)
CREATININE: 1.86 mg/dL — AB (ref 0.61–1.24)
Chloride: 120 mmol/L — ABNORMAL HIGH (ref 101–111)
GFR calc Af Amer: 43 mL/min — ABNORMAL LOW (ref >60)
GFR, EST NON AFRICAN AMERICAN: 37 mL/min — AB (ref >60)
GLUCOSE: 200 mg/dL — AB (ref 65–99)
Potassium: 3.5 mmol/L (ref 3.5–5.1)
Sodium: 152 mmol/L — ABNORMAL HIGH (ref 135–145)

## 2017-11-07 LAB — GLUCOSE, CAPILLARY
GLUCOSE-CAPILLARY: 168 mg/dL — AB (ref 65–99)
GLUCOSE-CAPILLARY: 163 mg/dL — AB (ref 65–99)
Glucose-Capillary: 175 mg/dL — ABNORMAL HIGH (ref 65–99)

## 2017-11-07 LAB — TRIGLYCERIDES: Triglycerides: 363 mg/dL — ABNORMAL HIGH (ref <150)

## 2017-11-07 LAB — MAGNESIUM: Magnesium: 2.2 mg/dL (ref 1.7–2.4)

## 2017-11-07 LAB — AMMONIA: AMMONIA: 32 umol/L (ref 9–35)

## 2017-11-07 MED ORDER — FOLIC ACID 1 MG PO TABS
1.0000 mg | ORAL_TABLET | Freq: Every day | ORAL | Status: DC
  Administered 2017-11-08 – 2017-11-16 (×9): 1 mg
  Filled 2017-11-07 (×9): qty 1

## 2017-11-07 MED ORDER — VITAL 1.5 CAL PO LIQD
1000.0000 mL | ORAL | Status: DC
  Administered 2017-11-07 – 2017-11-08 (×2): 1000 mL

## 2017-11-07 MED ORDER — PANTOPRAZOLE SODIUM 40 MG PO PACK
40.0000 mg | PACK | Freq: Two times a day (BID) | ORAL | Status: DC
Start: 2017-11-07 — End: 2017-11-15
  Administered 2017-11-07 – 2017-11-14 (×15): 40 mg
  Filled 2017-11-07 (×18): qty 20

## 2017-11-07 MED ORDER — ADULT MULTIVITAMIN LIQUID CH
15.0000 mL | Freq: Every day | ORAL | Status: DC
  Administered 2017-11-08 – 2017-11-16 (×9): 15 mL
  Filled 2017-11-07 (×9): qty 15

## 2017-11-07 MED ORDER — SODIUM CHLORIDE 0.9 % IV SOLN
2.0000 mg/h | INTRAVENOUS | Status: DC
  Administered 2017-11-07: 0.5 mg/h via INTRAVENOUS
  Filled 2017-11-07: qty 10

## 2017-11-07 MED ORDER — ORAL CARE MOUTH RINSE
15.0000 mL | OROMUCOSAL | Status: DC
  Administered 2017-11-07 – 2017-11-16 (×82): 15 mL via OROMUCOSAL

## 2017-11-07 MED ORDER — CHLORDIAZEPOXIDE HCL 25 MG PO CAPS
25.0000 mg | ORAL_CAPSULE | Freq: Three times a day (TID) | ORAL | Status: DC
  Administered 2017-11-07 – 2017-11-10 (×9): 25 mg via NASOGASTRIC
  Filled 2017-11-07 (×10): qty 1

## 2017-11-07 MED ORDER — DEXTROSE 5 % IV SOLN
500.0000 mg | INTRAVENOUS | Status: DC
  Administered 2017-11-07: 500 mg via INTRAVENOUS
  Filled 2017-11-07 (×2): qty 0.5

## 2017-11-07 MED ORDER — PROPOFOL 1000 MG/100ML IV EMUL
INTRAVENOUS | Status: AC
  Administered 2017-11-07: 50 ug/kg/min via INTRAVENOUS
  Filled 2017-11-07: qty 100

## 2017-11-07 MED ORDER — PROPOFOL 1000 MG/100ML IV EMUL
5.0000 ug/kg/min | INTRAVENOUS | Status: DC
  Administered 2017-11-07: 50 ug/kg/min via INTRAVENOUS
  Administered 2017-11-07 – 2017-11-08 (×3): 60 ug/kg/min via INTRAVENOUS
  Administered 2017-11-08: 40 ug/kg/min via INTRAVENOUS
  Administered 2017-11-08 (×2): 80 ug/kg/min via INTRAVENOUS
  Administered 2017-11-08: 60 ug/kg/min via INTRAVENOUS
  Filled 2017-11-07 (×7): qty 100

## 2017-11-07 MED ORDER — SUCCINYLCHOLINE CHLORIDE 20 MG/ML IJ SOLN
INTRAMUSCULAR | Status: AC
  Filled 2017-11-07: qty 1

## 2017-11-07 MED ORDER — VITAMIN B-1 100 MG PO TABS
100.0000 mg | ORAL_TABLET | Freq: Every day | ORAL | Status: DC
  Administered 2017-11-08 – 2017-11-16 (×9): 100 mg
  Filled 2017-11-07 (×9): qty 1

## 2017-11-07 MED ORDER — POTASSIUM CHLORIDE 2 MEQ/ML IV SOLN
Freq: Once | INTRAVENOUS | Status: AC
  Administered 2017-11-07: 12:00:00 via INTRAVENOUS
  Filled 2017-11-07: qty 1000

## 2017-11-07 MED ORDER — FREE WATER
200.0000 mL | Freq: Four times a day (QID) | Status: DC
  Administered 2017-11-07 – 2017-11-09 (×9): 200 mL

## 2017-11-07 MED ORDER — POTASSIUM PHOSPHATES 15 MMOLE/5ML IV SOLN
20.0000 mmol | Freq: Once | INTRAVENOUS | Status: AC
  Administered 2017-11-07: 20 mmol via INTRAVENOUS
  Filled 2017-11-07: qty 6.67

## 2017-11-07 MED ORDER — POTASSIUM CHLORIDE 2 MEQ/ML IV SOLN
Freq: Once | INTRAVENOUS | Status: AC
  Administered 2017-11-07: via INTRAVENOUS
  Filled 2017-11-07: qty 1000

## 2017-11-07 MED ORDER — CHLORHEXIDINE GLUCONATE 0.12% ORAL RINSE (MEDLINE KIT): 15.0000 mL | Freq: Two times a day (BID) | OROMUCOSAL | Status: DC

## 2017-11-07 NOTE — Progress Notes (Signed)
PHARMACIST - PHYSICIAN COMMUNICATION  CONCERNING: IV to Oral Route Change Policy  RECOMMENDATION: This patient is receiving pantoprazole by the intravenous route.  Based on criteria approved by the Pharmacy and Therapeutics Committee, the intravenous medication(s) is/are being converted to the equivalent oral dose form(s).   DESCRIPTION: These criteria include:  The patient is eating (either orally or via tube) and/or has been taking other orally administered medications for a least 24 hours  The patient has no evidence of active gastrointestinal bleeding or impaired GI absorption (gastrectomy, short bowel, patient on TNA or NPO).  If you have questions about this conversion, please contact the Pharmacy Department  []   [phone]( [phone] )  [facility] [x]   [phone]( [phone] )  [facility] []   [phone]( [phone] )  [facility] []   [phone]( [phone] )  [facility] []   [phone]( [phone] )  [facility]   Marajade Lei L, [facility] ServicesRPH 11/07/2017 9:18 PM

## 2017-11-07 NOTE — Progress Notes (Signed)
Pharmacy Electrolyte Monitoring Consult:  Pharmacy consulted to assist in monitoring and replacing electrolytes in this 62 y.o. male admitted on 11/06/2017 with angioedema now with concern for alcohol withdrawal.   Patient ordered Banana bag daily x 3 days and D5/LR with 40mEq of Potassium at 1675mL/hr. MIVF to be infusing when banana bag is not infusing.   Labs:  Sodium (mmol/L)  Date Value  11/07/2017 152 (H)   Potassium (mmol/L)  Date Value  11/07/2017 3.5   Magnesium (mg/dL)  Date Value  19/14/782904/14/2019 2.2   Phosphorus (mg/dL)  Date Value  56/21/308604/14/2019 3.8   Calcium (mg/dL)  Date Value  57/84/696204/14/2019 8.5 (L)   Albumin (g/dL)  Date Value  95/28/413204/13/2019 2.7 (L)   Assessment: K: 3.5, Mg: 1.5 Continues on D5 LR with 40 mEq KCL at 6475ml/hr.  Sodium trending up at 152, nephrology following.   Plan: Kphos 20mmol x 1 recheck phos this evening, BMP with AM labs.  4/14:  Phos @ 21:00 = 3.8  Will recheck electrolytes on 4/15 with AM labs.   Chanita Boden D 11/07/2017 10:26 PM

## 2017-11-07 NOTE — Progress Notes (Signed)
Patient ID: Dustin Orozco, male   DOB: 05/13/1956, 62 y.o.   MRN: [id number]  [employer] [facility] PROGRESS NOTE  Mikyle Sox YBO:[id number] DOB: 11-11-1955 DOA: 10/30/2017 PCP: [facility] [place]  HPI/Subjective: Patient more agitated today.  He was moaning and writhing in pain.  He states he has low back pain.  Family unclear if he takes pain medication at home.  Objective: Vitals:   11/07/17 0700 11/07/17 1227  BP: 128/85 (!) 187/103  Pulse: 67   Resp: (!) 22   Temp:    SpO2: 95%     Filed Weights   11/05/17 0400 11/06/17 0400 11/07/17 0530  Weight: 83 kg (182 lb 15.7 oz) 85.3 kg (188 lb 0.8 oz) 87.9 kg (193 lb 12.6 oz)    ROS: Review of Systems  Musculoskeletal: Positive for back pain.   Exam: Physical Exam  Constitutional: He appears lethargic.  HENT:  Nose: No mucosal edema.  Mouth/Throat: No oropharyngeal exudate or posterior oropharyngeal edema.  Eyes: Pupils are equal, round, and reactive to light. Conjunctivae are normal.  Crusting around the eyelids  Neck: No JVD present. Carotid bruit is not present. No edema present. No thyroid mass and no thyromegaly present.  Cardiovascular: S1 normal and S2 normal. Exam reveals no gallop.  No murmur heard. Pulses:      Dorsalis pedis pulses are 2+ on the right side, and 2+ on the left side.  Respiratory: No respiratory distress. He has decreased breath sounds in the right lower field and the left lower field. He has no wheezes. He has no rhonchi. He has no rales.  GI: Soft. Bowel sounds are normal. There is no tenderness.  Musculoskeletal:       Right ankle: He exhibits swelling.       Left ankle: He exhibits swelling.  Lymphadenopathy:    He has no cervical adenopathy.  Neurological: He appears lethargic.  Skin: Skin is warm. No rash noted. Nails show no clubbing.  Psychiatric: He is agitated.      Data Reviewed: Basic Metabolic Panel: Recent Labs  Lab 11/08/2017 2112  11/03/17 0535 11/04/17 0630 11/04/17 2141  11/05/17 0528 11/06/17 0426 11/07/17 0340  NA 138   < > 139 142  --  145 149* 152*  K 2.7*   < > 3.9 2.5* 2.8* 3.5 3.4* 3.5  CL 98*   < > 100* 101  --  108 114* 120*  CO2 26   < > 25 27  --  28 28 24   GLUCOSE 85   < > 126* 138*  --  159* 181* 200*  BUN 15   < > 67* 62*  --  67* 86* 102*  CREATININE 0.99   < > 5.33* 2.80*  --  1.87* 1.88* 1.86*  CALCIUM 9.3   < > 7.6* 8.8*  --  8.9 8.8* 8.5*  MG 1.7  --   --   --   --  2.1 2.3 2.2  PHOS 3.1  --  11.6*  --   --   --  2.5 1.5*   < > = values in this interval not displayed.   Liver Function Tests: Recent Labs  Lab 11/11/2017 2112 11/03/17 0535 11/04/17 0630 11/06/17 0426  AST 45*  --  26  --   ALT 37  --  26  --   ALKPHOS 69  --  61  --   BILITOT 0.9  --  1.4*  --   PROT 8.4*  --  7.5  --  ALBUMIN 4.7 3.3* 3.3* 2.7*    Recent Labs  Lab 11/06/17 0426  AMMONIA 29   CBC: Recent Labs  Lab 10/30/2017 2112 11/02/17 0218 11/04/17 0630 11/05/17 0528  WBC 5.7 8.6 10.4 9.2  HGB 14.6 13.0 13.2 12.7*  HCT 41.4 36.4* 36.9* 36.4*  MCV 91.9 92.0 92.3 92.8  PLT 216 172 143* 155   Cardiac Enzymes: Recent Labs  Lab 11/01/17 1625 11/02/17 0218 11/02/17 0839  TROPONINI 0.06* 0.06* 0.05*   CBG: Recent Labs  Lab 11/06/17 1531 11/06/17 2047 11/07/17 0004 11/07/17 0601 11/07/17 1128  GLUCAP 141* 171* 175* 168* 163*    Recent Results (from the past 240 hour(s))  MRSA PCR Screening     Status: None   Collection Time: 11/09/2017 11:38 PM  Result Value Ref Range Status   MRSA by PCR NEGATIVE NEGATIVE Final    Comment:        The GeneXpert MRSA Assay (FDA approved for NASAL specimens only), is one component of a comprehensive MRSA colonization surveillance program. It is not intended to diagnose MRSA infection nor to guide or monitor treatment for MRSA infections. Performed at [facility], [address], [place], [place] [address]      Studies: Dg Abd 1 View  Result Date: 11/06/2017 CLINICAL DATA:   Nasogastric tube placement. EXAM: ABDOMEN - 1 VIEW COMPARISON:  None. FINDINGS: A nasogastric tube is seen with tip in the proximal stomach. No dilated bowel loops seen in upper abdomen, however lower abdomen is not visualized. Bibasilar pulmonary opacity noted, right side greater than left. IMPRESSION: Nasogastric tube tip in proximal stomach. Electronically Signed   By: Earle Gell M.D.   On: 11/06/2017 10:17    Scheduled Meds: . chlordiazePOXIDE  25 mg Per NG tube TID  . chlorhexidine gluconate (MEDLINE KIT)  15 mL Mouth Rinse BID  . cloNIDine  0.3 mg Transdermal Weekly  . enoxaparin (LOVENOX) injection  40 mg Subcutaneous Q24H  . feeding supplement (PRO-STAT SUGAR FREE 64)  30 mL Per Tube BID  . [START ON 4/49/6759] folic acid  1 mg Per Tube Daily  . free water  200 mL Per Tube Q6H  . mouth rinse  15 mL Mouth Rinse q12n4p  . metoCLOPramide (REGLAN) injection  10 mg Intravenous Once  . [START ON 11/08/2017] multivitamin  15 mL Per Tube Daily  . pantoprazole (PROTONIX) IV  40 mg Intravenous Q12H  . [START ON 11/08/2017] thiamine  100 mg Per Tube Daily   Continuous Infusions: . dexmedetomidine (PRECEDEX) IV infusion 2 mcg/kg/hr (11/07/17 1251)  . feeding supplement (VITAL 1.5 CAL) 1,000 mL (11/07/17 1005)  . potassium PHOSPHATE IVPB (mmol) 20 mmol (11/07/17 1001)    Assessment/Plan:  1. Delirium tremens and alcohol withdrawal.  Patient is on Precedex drip.   critical care specialist trying to get the patient off the Precedex drip and on Librium via the NG tube.  Patient more agitated today than yesterday.  Continue to watch closely in the CCU stepdown. 2. Angioedema secondary to lisinopril.  This has improved with supportive care. 3. Acute hypoxic respiratory failure secondary to angioedema.  This has improved.  Patient extubated.  Still on nasal cannula at this point. 4. Accelerated hypertension on clonidine patch.  Last blood pressure probably high secondary to agitation 5. Acute kidney  injury.  Creatinine peaked at 5.33 and is down to 1.86 at this time. Still not back to baseline at this point.  BUN also very high and now on free water.  6. Hypernatremia on free water 7. NG tube feeding  Code Status:     Code Status Orders  (From admission, onward)        Start     Ordered   11/14/2017 2322  Full code  Continuous     10/27/2017 2321    Code Status History    This patient has a current code status but no historical code status.     Family Communication: Sister and  other family members at bedside Disposition Plan: To be determined  Consultants:  Critical care team  Time spent: 28 minutes Case discussed with critical care specialist.   [facility]

## 2017-11-07 NOTE — Procedures (Signed)
Endotracheal Intubation: Patient required placement of an artificial airway secondary to Respiratory Failure  Consent: Emergent.   Hand washing performed prior to starting the procedure.   Medications administered for sedation prior to procedure:  Propofol 20 ml .    A time out procedure was called and correct patient, name, & ID confirmed. Needed supplies and equipment were assembled and checked to include ETT, 10 ml syringe, Glidescope, Mac and Miller blades, suction, oxygen and bag mask valve, end tidal CO2 monitor.   Patient was positioned to align the mouth and pharynx to facilitate visualization of the glottis.   Heart rate, SpO2 and blood pressure was continuously monitored during the procedure. Pre-oxygenation was conducted prior to intubation and endotracheal tube was placed through the vocal cords into the trachea.     The artificial airway was placed under direct visualization via glidescope route using a Size 8 ETT on the first attempt.  ETT was secured at 23 cm mark.  Placement was confirmed by auscuitation of lungs with good breath sounds bilaterally and no stomach sounds.  Condensation was noted on endotracheal tube.   Pulse ox 98%.  CO2 detector in place with appropriate color change.   Complications: None .   Operator: Celesta Aver  Chest radiograph ordered and pending.   Comments: OGT placed via glidescope.  Cammie Sickle, M.D

## 2017-11-07 NOTE — Progress Notes (Signed)
Neuro: Pt  sedated and unable to follow commands at this time. Neuro exam WNL. Will continue to monitor.    Respiratory: Pt became increasingly agitated and RR up to 40's. Pt intubated with no complications with MD, RN, and RT at the bedside. Placement verified by CXR and pt tolerating at this time. Last ABG results below.   Results for Elenora GammaLEE, Ehsan (MRN [id number]) as of 11/07/2017 19:25  Ref. Range 11/07/2017 18:30  Sample type Unknown ARTERIAL DRAW  FIO2 Unknown 0.40  Mode Unknown PRESSURE REGULATE...  VT Latest Units: mL 500  Peep/cpap Latest Units: cm H20 5.0  pH, Arterial Latest Ref Range: 7.350 - 7.450  7.42  pCO2 arterial Latest Ref Range: 32.0 - 48.0 mmHg 37  pO2, Arterial Latest Ref Range: 83.0 - 108.0 mmHg 73 (L)  Acid-base deficit Latest Ref Range: 0.0 - 2.0 mmol/L 0.2  Bicarbonate Latest Ref Range: 20.0 - 28.0 mmol/L 24.0  O2 Saturation Latest Units: % 94.8  Patient temperature Unknown 37.0  Collection site Unknown RIGHT RADIAL  Allens test (pass/fail) Latest Ref Range: PASS  PASS     Cardiovascular: Pt remains in sinus rhythm with some ectopy at times. BP soft at this time due to new initiation of propofol infusion. Pt was hypertensive prior to intubation and received PRN dose of hydralazine.  Pt with generalized trace edema.    GI/GU: Pt with foley in place with adequate urine output. Pt with 1500 ml of urine output throughout shift. Last BM 4/13. Pt with NG tube and tube feeds going at this time. Pt tolerating feeds with minimal residuals.    Skin: Skin intact with no s/s of skin breakdown at this time. 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   Pain: Pt with no signs of pain at this time. Will continue to monitor.   Events: Pt intubated for airway protection.Pts plan of care to continue with current regimen and support patient during withdrawal period. Family updated and no further questions at this time.

## 2017-11-07 NOTE — Progress Notes (Signed)
PULMONARY / CRITICAL CARE MEDICINE   Name: Dustin Orozco MRN: [id number] DOB: 1955-10-27    ADMISSION DATE:  11/15/2017  PT PROFILE:   [age] M on chronic ACE inhibitor therapy, nasally intubated intubated by ENT in ED for angioedema with severe macroglossia and lip swelling.  MAJOR EVENTS/TEST RESULTS: 04/08 developed anuric renal failure 04/08 Renal US: no hydro, bladder decompressed 04/09 Renal consultation: "Acute renal failure is likely secondary to severe ATN likely from hypotension.  Renal ultrasound is negative for obstruction" 04/10 Extubated. Tolerated well but requiring Gary O2 @ 6 LPM 04/11 tolerating extubation well.  However, increasing agitated delirium.  Wife reports heavy chronic alcohol use.  Lorazepam initiated.  Persistent agitation.  Dexmedetomidine infusion initiated. 04/12 BIPAP initiated las night 04/14 Intubated  INDWELLING DEVICES:: ETT (L naris) 04/07 >> 04/10  MICRO DATA: MRSA PCR 04/07 >> NEG  ANTIMICROBIALS:     SUBJECTIVE:  Agitated delirium.  Suspected alcohol withdrawal.  Improved after initiation of dexmedetomidine.  No overt respiratory distress.  Strong cough. Follows simple commands intermittently  VITAL SIGNS: BP (!) 187/103   Pulse 67   Temp 98.6 F (37 C) (Oral)   Resp (!) 22   Ht 6\' 1"  (1.854 m)   Wt 193 lb 12.6 oz (87.9 kg) Comment: Simultaneous filing. User may not have seen previous data.  SpO2 95%   BMI 25.57 kg/m       VENTILATOR SETTINGS: Vent Mode: PRVC FiO2 (%):  [40 %] 40 % Set Rate:  [16 bmp] 16 bmp Vt Set:  [500 mL] 500 mL PEEP:  [5 cmH20] 5 cmH20 BIPAP for sleep  INTAKE / OUTPUT: I/O last 3 completed shifts: In: 6199 [I.V.:5080.3; NG/GT:818.7; IV Piggyback:300] Out: 1921 [Urine:1920; Emesis/NG output:1]  PHYSICAL EXAMINATION: General: comfortable on vent Neuro: No focal neurological deficits, confused, agitated HEENT: NCAT, sclerae white Cardiovascular: RRR, no M Lungs: Rhonchi on right, clear on left  Abdomen:  Soft, NT, NABS Extremities: Warm, no edema  LABS:  BMET Recent Labs  Lab 11/05/17 0528 11/06/17 0426 11/07/17 0340  NA 145 149* 152*  K 3.5 3.4* 3.5  CL 108 114* 120*  CO2 28 28 24   BUN 67* 86* 102*  CREATININE 1.87* 1.88* 1.86*  GLUCOSE 159* 181* 200*    Electrolytes Recent Labs  Lab 11/03/17 0535  11/05/17 0528 11/06/17 0426 11/07/17 0340  CALCIUM 7.6*   < > 8.9 8.8* 8.5*  MG  --   --  2.1 2.3 2.2  PHOS 11.6*  --   --  2.5 1.5*   < > = values in this interval not displayed.    CBC Recent Labs  Lab 11/02/17 0218 11/04/17 0630 11/05/17 0528  WBC 8.6 10.4 9.2  HGB 13.0 13.2 12.7*  HCT 36.4* 36.9* 36.4*  PLT 172 143* 155    Coag's Recent Labs  Lab 11/01/17 0041  INR 0.99    Sepsis Markers Recent Labs  Lab 10/30/2017 2112  PROCALCITON <0.10    ABG Recent Labs  Lab 11/22/2017 2355 11/07/17 1639  PHART 7.26* 7.51*  PCO2ART 63* 31*  PO2ART 95 65*    Liver Enzymes Recent Labs  Lab 10/28/2017 2112 11/03/17 0535 11/04/17 0630 11/06/17 0426  AST 45*  --  26  --   ALT 37  --  26  --   ALKPHOS 69  --  61  --   BILITOT 0.9  --  1.4*  --   ALBUMIN 4.7 3.3* 3.3* 2.7*    Cardiac Enzymes Recent Labs  Lab 11/01/17 1625 11/02/17 0218 11/02/17 0839  TROPONINI 0.06* 0.06* 0.05*    Glucose Recent Labs  Lab 11/06/17 0721 11/06/17 1531 11/06/17 2047 11/07/17 0004 11/07/17 0601 11/07/17 1128  GLUCAP 171* 141* 171* 175* 168* 163*    CXR: Mild vascular congestion, mild RLL atelectasis    ASSESSMENT / PLAN:  PULMONARY A: VDRF, resolved Angioedema, UAO-resolved  Acute hypoxic Respiratory failure secondary to airway compromise from DTs HCAP vs Aspiration pneumonia P:   Endotracheal intubation Continue supplemental oxygen as needed to maintain SPO2 >90% Continue nebulized bronchodilators as needed Culture; start Cefepime  CARDIOVASCULAR A:  Chronic hypertension Transient hypotension post intubation P:  Clonidine patch  discontinued Continue hydralazine prn  RENAL A:   AKI,  Hypernatremia, hyperchloremic azotemia P:   Monitor BMET intermittently Monitor I/Os Will give IV free water  Add free water Electrolytes replacemnet  GASTROINTESTINAL A:   Dysphagia due to AMS P:   SUP: N/I postextubation NPO  NGT for enteral nutrition  HEMATOLOGIC A:   Very mild thrombocytopenia P:  DVT px: Enoxaparin Monitor CBC intermittently Transfuse per usual guidelines   INFECTIOUS A:   HCAP vs Aspiration pneumonia P:   Monitor temp, WBC count Cefepime started  ENDOCRINE A:   Mild hyperglycemia without prior history of DM P:   CBGs q 8 hrs Consider SSI for glucose >180  NEUROLOGIC A:   History of heavy alcohol use Alcohol withdrawals with Delirium Tremens Continue thiamine, Folic acid and MVI. P:   Now intubated Propofol and benzodiazepine Lorazepam as needed initiated 04/11  Family:  Sister and mother updated.  Thank you for allowing me the privilege to care for this patient.  I have dedicated a total of 50 minutes in critical care time minus all appropriate exclusions.  Jackson Latino, MD PCCM service Mobile [phone] Pager [phone] 11/07/2017, 6:05 PM

## 2017-11-07 NOTE — Progress Notes (Signed)
Pharmacy Electrolyte Monitoring Consult:  Pharmacy consulted to assist in monitoring and replacing electrolytes in this 62 y.o. male admitted on 11/23/2017 with angioedema now with concern for alcohol withdrawal.   Patient ordered Banana bag daily x 3 days and D5/LR with 40mEq of Potassium at 9275mL/hr. MIVF to be infusing when banana bag is not infusing.   Labs:  Sodium (mmol/L)  Date Value  11/07/2017 152 (H)   Potassium (mmol/L)  Date Value  11/07/2017 3.5   Magnesium (mg/dL)  Date Value  16/10/960404/14/2019 2.2   Phosphorus (mg/dL)  Date Value  54/09/811904/14/2019 1.5 (L)   Calcium (mg/dL)  Date Value  14/78/295604/14/2019 8.5 (L)   Albumin (g/dL)  Date Value  21/30/865704/13/2019 2.7 (L)   Assessment: K: 3.5, Mg: 1.5 Continues on D5 LR with 40 mEq KCL at 2275ml/hr.  Sodium trending up at 152, nephrology following.   Plan: Kphos 20mmol x 1 recheck phos this evening, BMP with AM labs.  Marciano Mundt C 11/07/2017 9:10 AM

## 2017-11-07 NOTE — Progress Notes (Signed)
PHARMACY NOTE:  ANTIMICROBIAL RENAL DOSAGE ADJUSTMENT  Current antimicrobial regimen includes a mismatch between antimicrobial dosage and estimated renal function.  As per policy approved by the Pharmacy & Therapeutics and Medical Executive Committees, the antimicrobial dosage will be adjusted accordingly.  Current antimicrobial dosage:  Cefepime 500 mg IV Q12H   Indication: sepsis   Renal Function:  Estimated Creatinine Clearance: 45.9 mL/min (A) (by C-G formula based on SCr of 1.86 mg/dL (H)). []      On intermittent HD, scheduled: []      On CRRT    Antimicrobial dosage has been changed to:  Cefepime 500 mg IV Q24H   Additional comments:   Thank you for allowing pharmacy to be a part of this patient's care.  Felicitas Sine D, [facility] CenterRPH 11/07/2017 6:09 PM

## 2017-11-07 NOTE — Progress Notes (Addendum)
Nutrition Follow-up  DOCUMENTATION CODES:   Not applicable  INTERVENTION:  Initiate new goal TF regimen of Vital 1.5 at 55 mL/hr (1320 mL goal daily volume) + Pro-Stat 30 mL BID via NGT. Provides 2180 kcal, 119 grams of protein, 1003 mL H2O daily.  Provide free water flush of 200 mL every 6 hrs per tube. Total of 1803 mL H2O daily including water in tube feeding.  Provide liquid MVI daily per tube, thiamine 100 mg daily per tube, folic acid 1 mg daily per tube.  Continue monitoring potassium, magnesium, and phosphorus and replacing as needed. Patient is at risk of refeeding syndrome in setting of EtOH abuse and 6 days without any nutrition.  NUTRITION DIAGNOSIS:   Inadequate oral intake related to inability to eat as evidenced by NPO status.  Ongoing.  GOAL:   Provide needs based on ASPEN/SCCM guidelines  Met with TF regimen.  MONITOR:   Vent status, Labs, Weight trends, TF tolerance, I & O's  REASON FOR ASSESSMENT:   Consult Enteral/tube feeding initiation and management  ASSESSMENT:   62 year old male with PMHx of HTN, arthritis who is admitted with acute angioedema secondary to lisinopril s/p intubation on 4/7 by ENT.   -Patient was extubated on 4/10.  Patient confused and on Precedex gtt. Per chart was on BiPAP yesterday. Currently on nasal cannula. Diet has been unable to be advanced due to delirium. Wife has been reporting heavy chronic alcohol use.  Access: NGT placed 4/13; terminates in proximal stomach per abdominal x-ray 4/13; 71 cm at right nare  TF: currently receiving Vital High Protein at 40 mL/hr + Pro-Stat 30 mL BID  Medications reviewed and include: free water flush 200 mL Q8hrs, pantoprazole, Precedex gtt, potassium phosphate 20 mmol IV once today. NS with thiamine 794 mg, folic acid 1 mg, and MVI was stopped yesterday.  Labs reviewed: CBG 141-175 past 24 hrs, Sodium 152 (trending up), Chloride 120, BUN 102, Creatinine 1.86, Phosphorus 1.5.  I/O:  1500 mL UOP yesterday + 1 unmeasured UOP  Weight trend: 87.9 kg on 4/14; +1.1 kg from admission  Discussed with RN. Patient tolerating tube feeds.  Diet Order:  No diet orders on file  EDUCATION NEEDS:   No education needs have been identified at this time  Skin:  Skin Assessment: Reviewed RN Assessment  Last BM:  11/07/2017 - small type 7  Height:   Ht Readings from Last 1 Encounters:  11/04/2017 _0  (1.854 m)    Weight:   Wt Readings from Last 1 Encounters:  11/07/17 193 lb 12.6 oz (87.9 kg)    Ideal Body Weight:  83.6 kg  BMI:  Body mass index is 25.57 kg/m.  Estimated Nutritional Needs:   Kcal:  8016-5537 (MSJ x 1.2-1.3)  Protein:  104-122 grams (1.2-1.4 grams/kg)  Fluid:  1.8-2 L/day  Willey Blade, MS, RD, LDN Office: [phone] Pager: [phone] After Hours/Weekend Pager: [phone]

## 2017-11-08 ENCOUNTER — Inpatient Hospital Stay: Payer: Medicare Other

## 2017-11-08 LAB — COMPREHENSIVE METABOLIC PANEL
ALT: 75 U/L — ABNORMAL HIGH (ref 17–63)
AST: 75 U/L — AB (ref 15–41)
Albumin: 2.2 g/dL — ABNORMAL LOW (ref 3.5–5.0)
Alkaline Phosphatase: 102 U/L (ref 38–126)
Anion gap: 10 (ref 5–15)
BILIRUBIN TOTAL: 1.9 mg/dL — AB (ref 0.3–1.2)
BUN: 97 mg/dL — AB (ref 6–20)
CO2: 23 mmol/L (ref 22–32)
CREATININE: 2.66 mg/dL — AB (ref 0.61–1.24)
Calcium: 7.7 mg/dL — ABNORMAL LOW (ref 8.9–10.3)
Chloride: 119 mmol/L — ABNORMAL HIGH (ref 101–111)
GFR, EST AFRICAN AMERICAN: 28 mL/min — AB (ref >60)
GFR, EST NON AFRICAN AMERICAN: 24 mL/min — AB (ref >60)
Glucose, Bld: 184 mg/dL — ABNORMAL HIGH (ref 65–99)
POTASSIUM: 3.4 mmol/L — AB (ref 3.5–5.1)
Sodium: 152 mmol/L — ABNORMAL HIGH (ref 135–145)
Total Protein: 5.5 g/dL — ABNORMAL LOW (ref 6.5–8.1)

## 2017-11-08 LAB — CBC WITH DIFFERENTIAL/PLATELET
BASOS ABS: 0 10*3/uL (ref 0–0.1)
Basophils Relative: 0 %
EOS PCT: 1 %
Eosinophils Absolute: 0.1 10*3/uL (ref 0–0.7)
HCT: 32.9 % — ABNORMAL LOW (ref 40.0–52.0)
Hemoglobin: 11.5 g/dL — ABNORMAL LOW (ref 13.0–18.0)
LYMPHS PCT: 4 %
Lymphs Abs: 0.8 10*3/uL — ABNORMAL LOW (ref 1.0–3.6)
MCH: 33.3 pg (ref 26.0–34.0)
MCHC: 34.8 g/dL (ref 32.0–36.0)
MCV: 95.5 fL (ref 80.0–100.0)
Monocytes Absolute: 1.1 10*3/uL — ABNORMAL HIGH (ref 0.2–1.0)
Monocytes Relative: 6 %
NEUTROS PCT: 89 %
Neutro Abs: 15.6 10*3/uL — ABNORMAL HIGH (ref 1.4–6.5)
PLATELETS: 187 10*3/uL (ref 150–440)
RBC: 3.45 MIL/uL — AB (ref 4.40–5.90)
RDW: 15.3 % — ABNORMAL HIGH (ref 11.5–14.5)
WBC: 17.6 10*3/uL — AB (ref 3.8–10.6)

## 2017-11-08 LAB — GLUCOSE, CAPILLARY
GLUCOSE-CAPILLARY: 174 mg/dL — AB (ref 65–99)
GLUCOSE-CAPILLARY: 186 mg/dL — AB (ref 65–99)
GLUCOSE-CAPILLARY: 189 mg/dL — AB (ref 65–99)
Glucose-Capillary: 177 mg/dL — ABNORMAL HIGH (ref 65–99)

## 2017-11-08 LAB — PHOSPHORUS: PHOSPHORUS: 2.8 mg/dL (ref 2.5–4.6)

## 2017-11-08 LAB — MAGNESIUM: MAGNESIUM: 1.9 mg/dL (ref 1.7–2.4)

## 2017-11-08 MED ORDER — FENTANYL CITRATE (PF) 100 MCG/2ML IJ SOLN
100.0000 ug | Freq: Once | INTRAMUSCULAR | Status: AC
  Administered 2017-11-08: 100 ug via INTRAVENOUS

## 2017-11-08 MED ORDER — SODIUM CHLORIDE 0.9 % IV SOLN
0.0000 ug/min | INTRAVENOUS | Status: DC
  Administered 2017-11-08: 40 ug/min via INTRAVENOUS
  Administered 2017-11-08: 20 ug/min via INTRAVENOUS
  Administered 2017-11-08: 50 ug/min via INTRAVENOUS
  Filled 2017-11-08: qty 10
  Filled 2017-11-08: qty 1
  Filled 2017-11-08 (×2): qty 10

## 2017-11-08 MED ORDER — VECURONIUM BROMIDE 10 MG IV SOLR
10.0000 mg | INTRAVENOUS | Status: DC | PRN
  Administered 2017-11-08 – 2017-11-11 (×5): 10 mg via INTRAVENOUS
  Filled 2017-11-08 (×4): qty 10

## 2017-11-08 MED ORDER — STERILE WATER FOR INJECTION IJ SOLN
INTRAMUSCULAR | Status: AC
  Administered 2017-11-08: 10 mL
  Filled 2017-11-08: qty 10

## 2017-11-08 MED ORDER — FENTANYL BOLUS VIA INFUSION
50.0000 ug | INTRAVENOUS | Status: DC | PRN
  Administered 2017-11-08 – 2017-11-09 (×3): 50 ug via INTRAVENOUS
  Filled 2017-11-08: qty 50

## 2017-11-08 MED ORDER — POTASSIUM CHLORIDE 20 MEQ PO PACK
40.0000 meq | PACK | Freq: Once | ORAL | Status: AC
  Administered 2017-11-08: 40 meq
  Filled 2017-11-08: qty 2

## 2017-11-08 MED ORDER — VECURONIUM BROMIDE 10 MG IV SOLR
10.0000 mg | Freq: Once | INTRAVENOUS | Status: AC
  Administered 2017-11-08: 10 mg via INTRAVENOUS
  Filled 2017-11-08: qty 10

## 2017-11-08 MED ORDER — SODIUM CHLORIDE 0.9 % IV SOLN
1.0000 g | INTRAVENOUS | Status: DC
  Administered 2017-11-08: 1 g via INTRAVENOUS
  Filled 2017-11-08 (×3): qty 1

## 2017-11-08 MED ORDER — FENTANYL CITRATE (PF) 100 MCG/2ML IJ SOLN
50.0000 ug | Freq: Once | INTRAMUSCULAR | Status: AC
  Administered 2017-11-08: 50 ug via INTRAVENOUS
  Filled 2017-11-08: qty 2

## 2017-11-08 MED ORDER — FENTANYL 2500MCG IN NS 250ML (10MCG/ML) PREMIX INFUSION
25.0000 ug/h | INTRAVENOUS | Status: DC
  Administered 2017-11-08: 250 ug/h via INTRAVENOUS
  Administered 2017-11-08: 100 ug/h via INTRAVENOUS
  Administered 2017-11-09: 200 ug/h via INTRAVENOUS
  Administered 2017-11-10 (×2): 250 ug/h via INTRAVENOUS
  Administered 2017-11-11: 400 ug/h via INTRAVENOUS
  Administered 2017-11-11: 250 ug/h via INTRAVENOUS
  Administered 2017-11-13 (×2): 150 ug/h via INTRAVENOUS
  Administered 2017-11-15: 250 ug/h via INTRAVENOUS
  Filled 2017-11-08 (×13): qty 250

## 2017-11-08 MED ORDER — VITAL HIGH PROTEIN PO LIQD
1000.0000 mL | ORAL | Status: DC
  Administered 2017-11-08 – 2017-11-09 (×2): 1000 mL

## 2017-11-08 MED ORDER — SODIUM CHLORIDE 0.9 % IV BOLUS
1000.0000 mL | Freq: Once | INTRAVENOUS | Status: AC
  Administered 2017-11-08: 1000 mL via INTRAVENOUS

## 2017-11-08 MED ORDER — FENTANYL CITRATE (PF) 100 MCG/2ML IJ SOLN
INTRAMUSCULAR | Status: AC
  Filled 2017-11-08: qty 2

## 2017-11-08 MED FILL — Fentanyl Citrate Preservative Free (PF) Inj 100 MCG/2ML: INTRAMUSCULAR | Qty: 2 | Status: AC

## 2017-11-08 NOTE — Progress Notes (Signed)
Inpatient Diabetes Program Recommendations  AACE/ADA: New Consensus Statement on Inpatient Glycemic Control (2015)  Target Ranges:  Prepandial:   less than 140 mg/dL      Peak postprandial:   less than 180 mg/dL (1-2 hours)      Critically ill patients:  140 - 180 mg/dL   Lab Results  Component Value Date   GLUCAP 186 (H) 11/08/2017    Review of Glycemic ControlResults for Elenora GammaLEE, Cass (MRN [id number]) as of 11/08/2017 12:02  Ref. Range 11/08/2017 00:12 11/08/2017 06:04  Glucose-Capillary Latest Ref Range: 65 - 99 mg/dL 409189 (H) 811186 (H)   Inpatient Diabetes Program Recommendations:    May consider adding Novolog sensitive correction q 4 hours if blood sugars >180 mg/dL.   Thanks, Beryl MeagerJenny Jolina Symonds, RN, BC-ADM Inpatient Diabetes Coordinator Pager [phone] (8a-5p)

## 2017-11-08 NOTE — Progress Notes (Signed)
Patient's blood pressure has been steadily declining but unable to decrease sedation. Informed NP and IV NS 1,000 ml fluid bolus ordered.

## 2017-11-08 NOTE — Progress Notes (Signed)
Patient ID: Dustin Orozco, male   DOB: [birth year], 62 y.o.   MRN: [id number]  [facility] PROGRESS NOTE  Vibhav Waddill KJZ:[id number] DOB: 10-Sep-1955 DOA: 11/09/2017 PCP: [facility] [place]  HPI/Subjective: Patient intubated and sedated  Objective: Vitals:   11/08/17 1545 11/08/17 1600  BP: 107/74 101/72  Pulse: (!) 113 (!) 112  Resp: (!) 31 (!) 28  Temp:  99.6 F (37.6 C)  SpO2: 100% 100%    Filed Weights   11/06/17 0400 11/07/17 0530 11/08/17 0446  Weight: 85.3 kg (188 lb 0.8 oz) 87.9 kg (193 lb 12.6 oz) 88 kg (194 lb 0.1 oz)    ROS: Review of Systems  Unable to perform ROS: Acuity of condition   Exam: Physical Exam  HENT:  Nose: No mucosal edema.  Mouth/Throat: No oropharyngeal exudate or posterior oropharyngeal edema.  Eyes: Pupils are equal, round, and reactive to light. Conjunctivae are normal.  Crusting around the eyelids  Neck: No JVD present. Carotid bruit is not present. No edema present. No thyroid mass and no thyromegaly present.  Cardiovascular: S1 normal and S2 normal. Exam reveals no gallop.  No murmur heard. Pulses:      Dorsalis pedis pulses are 2+ on the right side, and 2+ on the left side.  Respiratory: No respiratory distress. He has decreased breath sounds in the right lower field and the left lower field. He has no wheezes. He has no rhonchi. He has no rales.  GI: Soft. Bowel sounds are normal. There is no tenderness.  Musculoskeletal:       Right ankle: He exhibits swelling.       Left ankle: He exhibits swelling.  Lymphadenopathy:    He has no cervical adenopathy.  Neurological:  Intubated and sedated  Skin: Skin is warm. No rash noted. Nails show no clubbing.  Psychiatric:  Intubated and sedated      Data Reviewed: Basic Metabolic Panel: Recent Labs  Lab 11/03/17 0535 11/04/17 0630 11/04/17 2141 11/05/17 0528 11/06/17 0426 11/07/17 0340 11/07/17 2108 11/08/17 0458  NA 139 142  --  145 149* 152*  --  152*  K 3.9 2.5*  2.8* 3.5 3.4* 3.5  --  3.4*  CL 100* 101  --  108 114* 120*  --  119*  CO2 25 27  --  _0 --  23  GLUCOSE 126* 138*  --  159* 181* 200*  --  184*  BUN 67* 62*  --  67* 86* 102*  --  97*  CREATININE 5.33* 2.80*  --  1.87* 1.88* 1.86*  --  2.66*  CALCIUM 7.6* 8.8*  --  8.9 8.8* 8.5*  --  7.7*  MG  --   --   --  2.1 2.3 2.2  --  1.9  PHOS 11.6*  --   --   --  2.5 1.5* 3.8 2.8   Liver Function Tests: Recent Labs  Lab 11/03/17 0535 11/04/17 0630 11/06/17 0426 11/08/17 0458  AST  --  26  --  75*  ALT  --  26  --  75*  ALKPHOS  --  61  --  102  BILITOT  --  1.4*  --  1.9*  PROT  --  7.5  --  5.5*  ALBUMIN 3.3* 3.3* 2.7* 2.2*    Recent Labs  Lab 11/06/17 0426 11/07/17 1343  AMMONIA 29 32   CBC: Recent Labs  Lab 11/02/17 0218 11/04/17 0630 11/05/17 0528 11/08/17 0458  WBC 8.6  10.4 9.2 17.6*  NEUTROABS  --   --   --  15.6*  HGB 13.0 13.2 12.7* 11.5*  HCT 36.4* 36.9* 36.4* 32.9*  MCV 92.0 92.3 92.8 95.5  PLT 172 143* 155 187   Cardiac Enzymes: Recent Labs  Lab 11/01/17 1625 11/02/17 0218 11/02/17 0839  TROPONINI 0.06* 0.06* 0.05*   CBG: Recent Labs  Lab 11/07/17 0601 11/07/17 1128 11/08/17 0012 11/08/17 0604 11/08/17 1225  GLUCAP 168* 163* 189* 186* 174*    Recent Results (from the past 240 hour(s))  MRSA PCR Screening     Status: None   Collection Time: 11/04/2017 11:38 PM  Result Value Ref Range Status   MRSA by PCR NEGATIVE NEGATIVE Final    Comment:        The GeneXpert MRSA Assay (FDA approved for NASAL specimens only), is one component of a comprehensive MRSA colonization surveillance program. It is not intended to diagnose MRSA infection nor to guide or monitor treatment for MRSA infections. Performed at [facility], [address] [place], [place] [address]   Culture, respiratory (NON-Expectorated)     Status: None (Preliminary result)   Collection Time: 11/08/17  5:14 AM  Result Value Ref Range Status   Specimen  Description   Final    TRACHEAL ASPIRATE Performed at [facility], [address], [place], [place] [address]    Special Requests   Final    NONE Performed at [facility], [address] [place], [place] [address]    Gram Stain   Final    FEW WBC PRESENT, PREDOMINANTLY PMN ABUNDANT GRAM POSITIVE COCCI ABUNDANT GRAM NEGATIVE RODS Performed at [facility], [place] [address] [place],  [address]    Culture PENDING  Incomplete   Report Status PENDING  Incomplete     Studies: Dg Chest Port 1 View  Result Date: 11/08/2017 CLINICAL DATA:  62 year old male with a history of acute respiratory failure EXAM: PORTABLE CHEST 1 VIEW COMPARISON:  11/07/2017, 11/07/2017, 11/04/2017 FINDINGS: Cardiomediastinal silhouette unchanged in size and contour, with the margins partially obscured by overlying lung/pleural disease. Similar appearance of opacity at the right mid and lower lung obscuring the right heart border, right hemidiaphragm, and with pleuroparenchymal thickening along the periphery. Similar appearance of mixed lucency centered in the hilar region extending towards periphery of the right lung. Streaky opacity at the left lung base. No pneumothorax. Endotracheal tube unchanged, terminating approximately 5.1 cm above the carina. Gastric tube terminates out of the field of view. IMPRESSION: Similar appearance of the chest x-ray, with evidence of a right-sided cavitating pneumonia and associated parapneumonic effusion. Again, contrast-enhanced CT may be useful for further characterisation. Unchanged endotracheal tube and gastric tube. Similar aeration on the left, with mild airspace disease at the left lung base. Electronically Signed   By: Corrie Mckusick D.O.   On: 11/08/2017 07:49   Dg Chest Port 1 View  Result Date: 11/07/2017 CLINICAL DATA:  Intubation EXAM: PORTABLE CHEST 1 VIEW COMPARISON:  Portable exam 1743 hours compared to 1636 hours FINDINGS: Tip of  endotracheal tube projects 7.1 cm above carina. Stable heart size, mediastinal contours and pulmonary vascularity. Persistent RIGHT pleural effusion and consolidation in the lower lungs. Foci of lucency in the midlung question cavitation. LEFT lung clear. Bones unremarkable. IMPRESSION: Satisfactory endotracheal tube position. RIGHT pleural effusion with consolidation in lower RIGHT lung and questionable areas of cavitation; follow-up CT chest recommended to evaluate. Electronically Signed   By: Crist Infante.D.  On: 11/07/2017 17:55   Dg Chest Port 1 View  Result Date: 11/07/2017 CLINICAL DATA:  Respiratory distress, acute hypoxic respiratory failure secondary to angioedema, history hypertension EXAM: PORTABLE CHEST 1 VIEW COMPARISON:  Portable exam 1645 hours compared to 11/04/2017 FINDINGS: Nasogastric tube extends into stomach. Normal heart size and mediastinal contours. New RIGHT pleural effusion. Atelectasis and infiltrate at inferior RIGHT chest. Several gas lucencies project over the RIGHT mid lung, question cavitary foci largest 2.8 cm. LEFT lung clear. No definite pneumothorax or acute osseous findings. IMPRESSION: New RIGHT pleural effusion with atelectasis and consolidation in lower RIGHT lung. Question cavitary foci in the RIGHT mid lung; follow-up CT chest recommended to evaluate. Electronically Signed   By: Lavonia Dana M.D.   On: 11/07/2017 17:10    Scheduled Meds: . chlordiazePOXIDE  25 mg Per NG tube TID  . chlorhexidine gluconate (MEDLINE KIT)  15 mL Mouth Rinse BID  . enoxaparin (LOVENOX) injection  40 mg Subcutaneous Q24H  . folic acid  1 mg Per Tube Daily  . free water  200 mL Per Tube Q6H  . mouth rinse  15 mL Mouth Rinse 10 times per day  . metoCLOPramide (REGLAN) injection  10 mg Intravenous Once  . multivitamin  15 mL Per Tube Daily  . pantoprazole sodium  40 mg Per Tube BID  . thiamine  100 mg Per Tube Daily   Continuous Infusions: . ceFEPime (MAXIPIME) IV    . feeding  supplement (VITAL HIGH PROTEIN) 1,000 mL (11/08/17 1311)  . fentaNYL infusion INTRAVENOUS 175 mcg/hr (11/08/17 1500)  . phenylephrine (NEO-SYNEPHRINE) Adult infusion 30 mcg/min (11/08/17 1530)  . propofol (DIPRIVAN) infusion Stopped (11/08/17 1430)    Assessment/Plan:  1. Delirium tremens and alcohol withdrawal.  Worsened yesterday evening requiring intubation.  Now can manage on the ventilator.  Librium via NG tube 2. Healthcare associated pneumonia versus aspiration pneumonia on cefepime 3. Hypotension on Neo-Synephrine and antihypertensive on hold. 4. Acute hypoxic respiratory failure requiring reintubation last night.  When I saw him today he was on 50% FiO2. 5. Angioedema secondary to lisinopril.  This has improved with supportive care. 6. Acute kidney injury.  Creatinine peaked at 5.33 and came down to 1.86 and now back up to 2.66. 7. Hypernatremia on free water 8. NG tube feeding  Code Status:     Code Status Orders  (From admission, onward)        Start     Ordered   10/28/2017 2322  Full code  Continuous     10/25/2017 2321    Code Status History    This patient has a current code status but no historical code status.     Family Communication: Sister and and family member at bedside Disposition Plan: To be determined  Consultants:  Critical care team  Time spent: 27 minutes Case discussed with critical care specialist.   [facility]

## 2017-11-08 NOTE — Progress Notes (Signed)
Informed NP that abg good. Sedation is not holding patient. Will change Versed from 0.5 to 2. Also, pt started on Neo for b/p control after receiving NS bolus earlier.

## 2017-11-08 NOTE — Progress Notes (Signed)
PULMONARY / CRITICAL CARE MEDICINE   Name: Dustin Orozco MRN: [id number] DOB: March 16, 1956    ADMISSION DATE:  11/04/2017  PT PROFILE:   60 M on chronic ACE inhibitor therapy, nasally intubated intubated by ENT in ED for angioedema with severe macroglossia and lip swelling.  MAJOR EVENTS/TEST RESULTS: 04/08 developed anuric renal failure 04/08 Renal US: no hydro, bladder decompressed 04/09 Renal consultation: "Acute renal failure is likely secondary to severe ATN likely from hypotension.  Renal ultrasound is negative for obstruction" 04/10 Extubated. Tolerated well but requiring [place] O2 @ 6 LPM 04/11 tolerating extubation well.  However, increasing agitated delirium.  Wife reports heavy chronic alcohol use.  Lorazepam initiated.  Persistent agitation.  Dexmedetomidine infusion initiated. 04/12 BIPAP initiated las night 04/14 Intubated  INDWELLING DEVICES:: ETT (L naris) 04/07 >> 04/10  MICRO DATA: MRSA PCR 04/07 >> NEG  ANTIMICROBIALS:     SUBJECTIVE:  Severe resp failure On vent  remains critically ill   VITAL SIGNS: BP 90/60   Pulse 100   Temp 99.1 F (37.3 C) (Oral)   Resp (!) 36   Ht 6\' 1"  (1.854 m)   Wt 194 lb 0.1 oz (88 kg)   SpO2 100%   BMI 25.60 kg/m       VENTILATOR SETTINGS: Vent Mode: PRVC FiO2 (%):  [40 %-50 %] 50 % Set Rate:  [16 bmp] 16 bmp Vt Set:  [500 mL] 500 mL PEEP:  [5 cmH20] 5 cmH20 Plateau Pressure:  [11 cmH20-15 cmH20] 11 cmH20 BIPAP for sleep  INTAKE / OUTPUT: I/O last 3 completed shifts: In: 6541.5 [I.V.:3450.1; NG/GT:2091.4; IV Piggyback:1000] Out: 2275 [Urine:2275]  PHYSICAL EXAMINATION: General:  on vent GCS<8T Neuro: No focal neurological deficits, confused, agitated HEENT: NCAT, sclerae white Cardiovascular: RRR, no M Lungs: Rhonchi on right, clear on left  Abdomen: Soft, NT, NABS Extremities: Warm, no edema  LABS:  BMET Recent Labs  Lab 11/06/17 0426 11/07/17 0340 11/08/17 0458  NA 149* 152* 152*  K 3.4* 3.5 3.4*   CL 114* 120* 119*  CO2 28 24 23   BUN 86* 102* 97*  CREATININE 1.88* 1.86* 2.66*  GLUCOSE 181* 200* 184*    Electrolytes Recent Labs  Lab 11/06/17 0426 11/07/17 0340 11/07/17 2108 11/08/17 0458  CALCIUM 8.8* 8.5*  --  7.7*  MG 2.3 2.2  --  1.9  PHOS 2.5 1.5* 3.8 2.8    CBC Recent Labs  Lab 11/04/17 0630 11/05/17 0528 11/08/17 0458  WBC 10.4 9.2 17.6*  HGB 13.2 12.7* 11.5*  HCT 36.9* 36.4* 32.9*  PLT 143* 155 187    Coag's No results for input(s): APTT, INR in the last 168 hours.  Sepsis Markers No results for input(s): LATICACIDVEN, PROCALCITON, O2SATVEN in the last 168 hours.  ABG Recent Labs  Lab 11/07/17 1639 11/07/17 1830 11/08/17 0508  PHART 7.51* 7.42 7.42  PCO2ART 31* 37 34  PO2ART 65* 73* 73*    Liver Enzymes Recent Labs  Lab 11/04/17 0630 11/06/17 0426 11/08/17 0458  AST 26  --  75*  ALT 26  --  75*  ALKPHOS 61  --  102  BILITOT 1.4*  --  1.9*  ALBUMIN 3.3* 2.7* 2.2*    Cardiac Enzymes Recent Labs  Lab 11/01/17 1625 11/02/17 0218 11/02/17 0839  TROPONINI 0.06* 0.06* 0.05*    Glucose Recent Labs  Lab 11/06/17 2047 11/07/17 0004 11/07/17 0601 11/07/17 1128 11/08/17 0012 11/08/17 0604  GLUCAP 171* 175* 168* 163* 189* 186*    CXR: Mild vascular  congestion, mild RLL atelectasis    ASSESSMENT / PLAN: 62 yo AAM admitted for severe angioedema s/p extubation and re-intubated for severe DT's with encephalopathy with severe resp failure on vent   PULMONARY A: Acute hypoxic Respiratory failure secondary to airway compromise from DTs HCAP vs Aspiration pneumonia Angioedema-resolved P:   Endotracheal intubation Continue supplemental oxygen as needed to maintain SPO2 >90% Continue nebulized bronchodilators as needed Culture; started Cefepime  CARDIOVASCULAR A:  Chronic hypertension Transient hypotension post intubation P:  Clonidine patch discontinued Continue hydralazine prn  RENAL A:   AKI,  Hypernatremia,  hyperchloremic azotemia P:   Monitor BMET intermittently Monitor I/Os Will give IV free water  Added free water Electrolytes replacemnet  GASTROINTESTINAL A:   Dysphagia due to AMS P:   SUP: N/I postextubation NGT for enteral nutrition  HEMATOLOGIC A:   Very mild thrombocytopenia P:  DVT px: Enoxaparin Monitor CBC intermittently Transfuse per usual guidelines   INFECTIOUS A:   HCAP vs Aspiration pneumonia P:   Monitor temp, WBC count Cefepime started  ENDOCRINE A:   Mild hyperglycemia without prior history of DM P:   CBGs q 8 hrs Consider SSI for glucose >180  NEUROLOGIC A:   History of heavy alcohol use Alcohol withdrawals with Delirium Tremens Continue thiamine, Folic acid and MVI. P:   Now intubated Propofol and benzodiazepine Lorazepam as needed initiated 04/11   Critical Care Time devoted to patient care services described in this note is 34 minutes.   Overall, patient is critically ill, prognosis is guarded.  Patient with Multiorgan failure and at high risk for cardiac arrest and death.    Lucie LeatherKurian David Steadman Prosperi, M.D.  [facility]  Medical Director [facility] Medical Director [facility] Cardio-Pulmonary Department

## 2017-11-08 NOTE — Progress Notes (Signed)
Nutrition Follow-up  DOCUMENTATION CODES:   Not applicable  INTERVENTION:  Initiate new goal TF regimen of Vital High Protein at 60 mL/hr (1440 mL goal daily volume) via NGT. Provides 1440 kcal, 126 grams of protein, 1210 mL H2O daily. With current rate of propofol provides 2274 kcal daily.  Provide free water flush of 200 mL Q6hrs. Total of 2010 mL H2O including water in tube feeds. Will monitor sodium and can adjust as needed per discussion with MD and pharmacy on rounds.  Continue liquid MVI daily per tube, folic acid 1 mg daily per tube, thiamine 100 mg daily per tube.  NUTRITION DIAGNOSIS:   Inadequate oral intake related to inability to eat as evidenced by NPO status.  Ongoing - addressing with TF regimen.  GOAL:   Provide needs based on ASPEN/SCCM guidelines  Met with TF regimen.  MONITOR:   Vent status, Labs, Weight trends, TF tolerance, I & O's  REASON FOR ASSESSMENT:   Consult Enteral/tube feeding initiation and management  ASSESSMENT:   62 year old male with PMHx of HTN, arthritis who is admitted with acute angioedema secondary to lisinopril s/p intubation on 4/7 by ENT.   -Patient was extubated on 4/10. -Patient was intubated on 4/14 due to acute hypoxic respiratory failure secondary to airway compromise from DTs.  Patient now intubated and sedated.  Access: NGT placed 4/13; terminates in proximal stomach per abdominal x-ray 4/13; 71 cm at right nare  MAP: 55-86 mmHg  TF: pt tolerating Vital 1.5 at 55 mL/hr + Pro-Stat 30 mL BID  Patient is currently intubated on ventilator support MV: 16.6 L/min Temp (24hrs), Avg:99.3 F (37.4 C), Min:98.9 F (37.2 C), Max:100.1 F (37.8 C)  Propofol: 31.6 ml/hr (834 kcal daily)  Medications reviewed and include: cefepime, folic acid 1 mg daily per tube, liquid MVI daily per tube, Protonix, thiamine 100 mg daily per tube, fentanyl gtt, phenylephrine gtt at 75 mcg/min, propofol gtt.  Labs reviewed: CBG 174-189,  Sodium 152, Potassium 3.4, Chloride 119, BUN 97, Creatinine 2.66. Phosphorus and Magnesium WNL today. Triglycerides 363 on evening of 4/14.  I/O: 1625 mL UOP yesterday (0.8 mL/kg/hr)  Discussed with RN and on rounds. Plan is for free water flushes in setting of hypernatremia.  Diet Order:  No diet orders on file  EDUCATION NEEDS:   No education needs have been identified at this time  Skin:  Skin Assessment: Reviewed RN Assessment  Last BM:  11/07/2017 - small type 7  Height:   Ht Readings from Last 1 Encounters:  11/23/2017 6' 1" (1.854 m)    Weight:   Wt Readings from Last 1 Encounters:  11/08/17 194 lb 0.1 oz (88 kg)    Ideal Body Weight:  83.6 kg  BMI:  Body mass index is 25.6 kg/m.  Estimated Nutritional Needs:   Kcal:  2267 (PSU 2003b w/ MSJ 1721, Ve 16.6, Tmax 37.8)  Protein:  104-122 grams (1.2-1.4 grams/kg)  Fluid:  1.8-2 L/day  Willey Blade, MS, RD, LDN Office: [phone] Pager: [phone] After Hours/Weekend Pager: [phone]

## 2017-11-09 LAB — CBC WITH DIFFERENTIAL/PLATELET
BASOS ABS: 0 10*3/uL (ref 0–0.1)
BASOS PCT: 0 %
EOS ABS: 0.1 10*3/uL (ref 0–0.7)
Eosinophils Relative: 0 %
HEMATOCRIT: 35.1 % — AB (ref 40.0–52.0)
HEMOGLOBIN: 11.9 g/dL — AB (ref 13.0–18.0)
Lymphocytes Relative: 3 %
Lymphs Abs: 0.6 10*3/uL — ABNORMAL LOW (ref 1.0–3.6)
MCH: 32.7 pg (ref 26.0–34.0)
MCHC: 33.9 g/dL (ref 32.0–36.0)
MCV: 96.6 fL (ref 80.0–100.0)
MONOS PCT: 4 %
Monocytes Absolute: 1 10*3/uL (ref 0.2–1.0)
NEUTROS ABS: 23.1 10*3/uL — AB (ref 1.4–6.5)
NEUTROS PCT: 93 %
Platelets: 219 10*3/uL (ref 150–440)
RBC: 3.64 MIL/uL — AB (ref 4.40–5.90)
RDW: 15.7 % — ABNORMAL HIGH (ref 11.5–14.5)
WBC: 24.8 10*3/uL — AB (ref 3.8–10.6)

## 2017-11-09 LAB — PHOSPHORUS: PHOSPHORUS: 6.1 mg/dL — AB (ref 2.5–4.6)

## 2017-11-09 LAB — BASIC METABOLIC PANEL
ANION GAP: 8 (ref 5–15)
BUN: 103 mg/dL — ABNORMAL HIGH (ref 6–20)
CHLORIDE: 116 mmol/L — AB (ref 101–111)
CO2: 23 mmol/L (ref 22–32)
CREATININE: 3.63 mg/dL — AB (ref 0.61–1.24)
Calcium: 7.4 mg/dL — ABNORMAL LOW (ref 8.9–10.3)
GFR calc non Af Amer: 16 mL/min — ABNORMAL LOW (ref >60)
GFR, EST AFRICAN AMERICAN: 19 mL/min — AB (ref >60)
Glucose, Bld: 163 mg/dL — ABNORMAL HIGH (ref 65–99)
POTASSIUM: 4.1 mmol/L (ref 3.5–5.1)
SODIUM: 147 mmol/L — AB (ref 135–145)

## 2017-11-09 LAB — GLUCOSE, CAPILLARY
GLUCOSE-CAPILLARY: 132 mg/dL — AB (ref 65–99)
GLUCOSE-CAPILLARY: 187 mg/dL — AB (ref 65–99)
GLUCOSE-CAPILLARY: 165 mg/dL — AB (ref 65–99)
Glucose-Capillary: 163 mg/dL — ABNORMAL HIGH (ref 65–99)
Glucose-Capillary: 184 mg/dL — ABNORMAL HIGH (ref 65–99)

## 2017-11-09 LAB — MAGNESIUM: MAGNESIUM: 1.8 mg/dL (ref 1.7–2.4)

## 2017-11-09 MED ORDER — SODIUM CHLORIDE 0.9 % IV SOLN
3.0000 g | Freq: Two times a day (BID) | INTRAVENOUS | Status: DC
  Administered 2017-11-09: 3 g via INTRAVENOUS
  Filled 2017-11-09 (×3): qty 3

## 2017-11-09 MED ORDER — ACETAMINOPHEN 160 MG/5ML PO SOLN
650.0000 mg | Freq: Four times a day (QID) | ORAL | Status: DC | PRN
  Administered 2017-11-10 – 2017-11-15 (×3): 650 mg
  Filled 2017-11-09 (×5): qty 20.3

## 2017-11-09 MED ORDER — FREE WATER
250.0000 mL | Freq: Four times a day (QID) | Status: DC
  Administered 2017-11-09 – 2017-11-10 (×3): 250 mL

## 2017-11-09 MED ORDER — ENOXAPARIN SODIUM 30 MG/0.3ML ~~LOC~~ SOLN: 30.0000 mg | SUBCUTANEOUS | Status: DC

## 2017-11-09 MED ORDER — PRO-STAT SUGAR FREE PO LIQD
30.0000 mL | Freq: Two times a day (BID) | ORAL | Status: DC
  Administered 2017-11-09 – 2017-11-12 (×7): 30 mL

## 2017-11-09 MED ORDER — VITAL 1.5 CAL PO LIQD
1000.0000 mL | ORAL | Status: DC
  Administered 2017-11-09 – 2017-11-12 (×4): 1000 mL

## 2017-11-09 MED ORDER — MAGNESIUM SULFATE IN D5W 1-5 GM/100ML-% IV SOLN
1.0000 g | Freq: Once | INTRAVENOUS | Status: AC
  Administered 2017-11-09: 1 g via INTRAVENOUS
  Filled 2017-11-09: qty 100

## 2017-11-09 NOTE — Progress Notes (Signed)
Patient ID: Dustin Orozco, male   DOB: 13-Aug-1955, 62 y.o.   MRN: [id number]  [employer] PROGRESS NOTE  Dustin Orozco OHK:[id number] DOB: [birth year] DOA: 11/19/2017 PCP: [facility] [place]  HPI/Subjective: Patient intubated and was off sedation they were waiting for him to wake up when I saw him.  Objective: Vitals:   11/09/17 1400 11/09/17 1437  BP: 114/77   Pulse: (!) 104   Resp: (!) 24 (!) 35  Temp:    SpO2: 97%     Filed Weights   11/07/17 0530 11/08/17 0446 11/09/17 0416  Weight: 87.9 kg (193 lb 12.6 oz) 88 kg (194 lb 0.1 oz) 90.8 kg (200 lb 2.8 oz)    ROS: Review of Systems  Unable to perform ROS: Acuity of condition   Exam: Physical Exam  HENT:  Nose: No mucosal edema.  Unable to look into mouth  Eyes: Pupils are equal, round, and reactive to light. Conjunctivae are normal.  Neck: No JVD present. Carotid bruit is not present. No edema present. No thyroid mass and no thyromegaly present.  Cardiovascular: S1 normal and S2 normal. Exam reveals no gallop.  No murmur heard. Pulses:      Dorsalis pedis pulses are 2+ on the right side, and 2+ on the left side.  Respiratory: No respiratory distress. He has decreased breath sounds in the right lower field and the left lower field. He has no wheezes. He has no rhonchi. He has no rales.  GI: Soft. Bowel sounds are normal. There is no tenderness.  Musculoskeletal:       Right ankle: He exhibits swelling.       Left ankle: He exhibits swelling.  Lymphadenopathy:    He has no cervical adenopathy.  Neurological:   was unresponsive when I saw him  Skin: Skin is warm. No rash noted. Nails show no clubbing.  Psychiatric:  Was unresponsive when I saw him      Data Reviewed: Basic Metabolic Panel: Recent Labs  Lab 11/05/17 0528 11/06/17 0426 11/07/17 0340 11/07/17 2108 11/08/17 0458 11/09/17 0733  NA 145 149* 152*  --  152* 147*  K 3.5 3.4* 3.5  --  3.4* 4.1  CL 108 114* 120*  --  119* 116*  CO2 _0 --  23 23  GLUCOSE 159* 181* 200*  --  184* 163*  BUN 67* 86* 102*  --  97* 103*  CREATININE 1.87* 1.88* 1.86*  --  2.66* 3.63*  CALCIUM 8.9 8.8* 8.5*  --  7.7* 7.4*  MG 2.1 2.3 2.2  --  1.9 1.8  PHOS  --  2.5 1.5* 3.8 2.8 6.1*   Liver Function Tests: Recent Labs  Lab 11/03/17 0535 11/04/17 0630 11/06/17 0426 11/08/17 0458  AST  --  26  --  75*  ALT  --  26  --  75*  ALKPHOS  --  61  --  102  BILITOT  --  1.4*  --  1.9*  PROT  --  7.5  --  5.5*  ALBUMIN 3.3* 3.3* 2.7* 2.2*    Recent Labs  Lab 11/06/17 0426 11/07/17 1343  AMMONIA 29 32   CBC: Recent Labs  Lab 11/04/17 0630 11/05/17 0528 11/08/17 0458 11/09/17 0733  WBC 10.4 9.2 17.6* 24.8*  NEUTROABS  --   --  15.6* 23.1*  HGB 13.2 12.7* 11.5* 11.9*  HCT 36.9* 36.4* 32.9* 35.1*  MCV 92.3 92.8 95.5 96.6  PLT 143* 155 187 219  4CBG: Recent Labs  Lab 11/08/17 1225 11/08/17 1825 11/09/17 0048 11/09/17 0608 11/09/17 1145  GLUCAP 174* 177* 163* 132* 187*    Recent Results (from the past 240 hour(s))  MRSA PCR Screening     Status: None   Collection Time: 11/08/2017 11:38 PM  Result Value Ref Range Status   MRSA by PCR NEGATIVE NEGATIVE Final    Comment:        The GeneXpert MRSA Assay (FDA approved for NASAL specimens only), is one component of a comprehensive MRSA colonization surveillance program. It is not intended to diagnose MRSA infection nor to guide or monitor treatment for MRSA infections. Performed at [facility], [address] [place], [place] [address]   Culture, respiratory (NON-Expectorated)     Status: None (Preliminary result)   Collection Time: 11/08/17  5:14 AM  Result Value Ref Range Status   Specimen Description   Final    TRACHEAL ASPIRATE Performed at [facility], [address], [place], [place] [address]    Special Requests   Final    NONE Performed at [facility], [address] [place], [place] [address]    Gram Stain   Final     FEW WBC PRESENT, PREDOMINANTLY PMN ABUNDANT GRAM POSITIVE COCCI ABUNDANT GRAM NEGATIVE RODS    Culture   Final    ABUNDANT STAPHYLOCOCCUS AUREUS SUSCEPTIBILITIES TO FOLLOW Performed at [facility], [place] [address] [place],  [address]    Report Status PENDING  Incomplete     Studies: Dg Chest Port 1 View  Result Date: 11/08/2017 CLINICAL DATA:  62 year old male with a history of acute respiratory failure EXAM: PORTABLE CHEST 1 VIEW COMPARISON:  11/07/2017, 11/07/2017, 11/04/2017 FINDINGS: Cardiomediastinal silhouette unchanged in size and contour, with the margins partially obscured by overlying lung/pleural disease. Similar appearance of opacity at the right mid and lower lung obscuring the right heart border, right hemidiaphragm, and with pleuroparenchymal thickening along the periphery. Similar appearance of mixed lucency centered in the hilar region extending towards periphery of the right lung. Streaky opacity at the left lung base. No pneumothorax. Endotracheal tube unchanged, terminating approximately 5.1 cm above the carina. Gastric tube terminates out of the field of view. IMPRESSION: Similar appearance of the chest x-ray, with evidence of a right-sided cavitating pneumonia and associated parapneumonic effusion. Again, contrast-enhanced CT may be useful for further characterisation. Unchanged endotracheal tube and gastric tube. Similar aeration on the left, with mild airspace disease at the left lung base. Electronically Signed   By: Corrie Mckusick D.O.   On: 11/08/2017 07:49   Dg Chest Port 1 View  Result Date: 11/07/2017 CLINICAL DATA:  Intubation EXAM: PORTABLE CHEST 1 VIEW COMPARISON:  Portable exam 1743 hours compared to 1636 hours FINDINGS: Tip of endotracheal tube projects 7.1 cm above carina. Stable heart size, mediastinal contours and pulmonary vascularity. Persistent RIGHT pleural effusion and consolidation in the lower lungs. Foci of lucency in the midlung question  cavitation. LEFT lung clear. Bones unremarkable. IMPRESSION: Satisfactory endotracheal tube position. RIGHT pleural effusion with consolidation in lower RIGHT lung and questionable areas of cavitation; follow-up CT chest recommended to evaluate. Electronically Signed   By: Lavonia Dana M.D.   On: 11/07/2017 17:55   Dg Chest Port 1 View  Result Date: 11/07/2017 CLINICAL DATA:  Respiratory distress, acute hypoxic respiratory failure secondary to angioedema, history hypertension EXAM: PORTABLE CHEST 1 VIEW COMPARISON:  Portable exam 1645 hours compared to 11/04/2017 FINDINGS: Nasogastric tube extends into stomach.  Normal heart size and mediastinal contours. New RIGHT pleural effusion. Atelectasis and infiltrate at inferior RIGHT chest. Several gas lucencies project over the RIGHT mid lung, question cavitary foci largest 2.8 cm. LEFT lung clear. No definite pneumothorax or acute osseous findings. IMPRESSION: New RIGHT pleural effusion with atelectasis and consolidation in lower RIGHT lung. Question cavitary foci in the RIGHT mid lung; follow-up CT chest recommended to evaluate. Electronically Signed   By: Lavonia Dana M.D.   On: 11/07/2017 17:10    Scheduled Meds: . chlordiazePOXIDE  25 mg Per NG tube TID  . chlorhexidine gluconate (MEDLINE KIT)  15 mL Mouth Rinse BID  . enoxaparin (LOVENOX) injection  40 mg Subcutaneous Q24H  . feeding supplement (PRO-STAT SUGAR FREE 64)  30 mL Per Tube BID  . folic acid  1 mg Per Tube Daily  . free water  250 mL Per Tube Q6H  . mouth rinse  15 mL Mouth Rinse 10 times per day  . metoCLOPramide (REGLAN) injection  10 mg Intravenous Once  . multivitamin  15 mL Per Tube Daily  . pantoprazole sodium  40 mg Per Tube BID  . thiamine  100 mg Per Tube Daily   Continuous Infusions: . ampicillin-sulbactam (UNASYN) IV Stopped (11/09/17 1348)  . feeding supplement (VITAL 1.5 CAL) 1,000 mL (11/09/17 1354)  . fentaNYL infusion INTRAVENOUS 125 mcg/hr (11/09/17 1134)     Assessment/Plan:  1. Delirium tremens and alcohol withdrawal.  Librium via NG tube.  Now the patient is back on the ventilator can go through the risks of withdrawal process while on the vent.  When I saw him today we were still waiting for him to wake up off sedation. 2. Healthcare associated pneumonia versus aspiration pneumonia on Unasyn 3. Hypotension off pressors at this point 4. Acute hypoxic respiratory failure. when I saw him this morning he was on pressure support 40% FiO2. 5. Angioedema secondary to lisinopril.  This has improved with supportive care. 6. Acute kidney injury.  Creatinine peaked at 5.33 and came down to 1.86 and now back up to 3.63. 7. Hypernatremia on free water 8. NG tube feeding 9. Hypomagnesemia replaced today  Code Status:     Code Status Orders  (From admission, onward)        Start     Ordered   11/20/2017 2322  Full code  Continuous     11/12/2017 2321    Code Status History    This patient has a current code status but no historical code status.     Family Communication: As per critical care specialist  disposition Plan: To be determined  Consultants:  Critical care team  Time spent: 25 minutes Case discussed with critical care specialist and nursing staff.  Averianna Brugger [employer]

## 2017-11-09 NOTE — Progress Notes (Signed)
   11/09/17 1315  Clinical Encounter Type  Visited With Patient and family together  Visit Type Follow-up   Chaplain checked in with family.  They spoke of 'baby steps' the patient is taking and hopes for healing.  They would appreciate knowing that chaplain is praying for the patient, which chaplain agreed to do.  Chaplain encouraged family to have chaplain paged as needed.

## 2017-11-09 NOTE — Progress Notes (Signed)
PULMONARY / CRITICAL CARE MEDICINE   Name: Elenora Gammaustin Donaway MRN: [id number] DOB: 1955-10-02    ADMISSION DATE:  10/30/2017  PT PROFILE:   [age] M on chronic ACE inhibitor therapy, nasally intubated intubated by ENT in ED for angioedema with severe macroglossia and lip swelling.  MAJOR EVENTS/TEST RESULTS: 04/08 developed anuric renal failure 04/08 Renal US: no hydro, bladder decompressed 04/09 Renal consultation: "Acute renal failure is likely secondary to severe ATN likely from hypotension.  Renal ultrasound is negative for obstruction" 04/10 Extubated. Tolerated well but requiring [place] O2 @ 6 LPM 04/11 tolerating extubation well.  However, increasing agitated delirium.  Wife reports heavy chronic alcohol use.  Lorazepam initiated.  Persistent agitation.  Dexmedetomidine infusion initiated. 04/12 BIPAP initiated las night 04/14 Intubated 4/15 remains intubated  INDWELLING DEVICES:: ETT (L naris) 04/07 >> 04/10  MICRO DATA: MRSA PCR 04/07 >> NEG  ANTIMICROBIALS:     SUBJECTIVE:  Severe resp failure On vent  remains critically ill Remains delerious   VITAL SIGNS: BP 94/65   Pulse (!) 101   Temp 99.9 F (37.7 C) (Axillary)   Resp 18   Ht 6\' 1"  (1.854 m)   Wt 200 lb 2.8 oz (90.8 kg)   SpO2 100%   BMI 26.41 kg/m       VENTILATOR SETTINGS: Vent Mode: PSV FiO2 (%):  [40 %-50 %] 40 % Set Rate:  [16 bmp] 16 bmp Vt Set:  [500 mL] 500 mL PEEP:  [5 cmH20] 5 cmH20 Pressure Support:  [5 cmH20] 5 cmH20 Plateau Pressure:  [14 cmH20-16 cmH20] 16 cmH20 BIPAP for sleep  INTAKE / OUTPUT: I/O last 3 completed shifts: In: 5869.1 [I.V.:1910; NG/GT:2959.1; IV Piggyback:1000] Out: 937 [Urine:937]  PHYSICAL EXAMINATION: General:  on vent GCS<8T Neuro: No focal neurological deficits, confused, agitated HEENT: NCAT, sclerae white Cardiovascular: RRR, no M Lungs: Rhonchi on right, clear on left  Abdomen: Soft, NT, NABS Extremities: Warm, no edema  LABS:  BMET Recent Labs  Lab  11/07/17 0340 11/08/17 0458 11/09/17 0733  NA 152* 152* 147*  K 3.5 3.4* 4.1  CL 120* 119* 116*  CO2 24 23 23   BUN 102* 97* 103*  CREATININE 1.86* 2.66* 3.63*  GLUCOSE 200* 184* 163*    Electrolytes Recent Labs  Lab 11/07/17 0340 11/07/17 2108 11/08/17 0458 11/09/17 0733  CALCIUM 8.5*  --  7.7* 7.4*  MG 2.2  --  1.9 1.8  PHOS 1.5* 3.8 2.8 6.1*    CBC Recent Labs  Lab 11/05/17 0528 11/08/17 0458 11/09/17 0733  WBC 9.2 17.6* 24.8*  HGB 12.7* 11.5* 11.9*  HCT 36.4* 32.9* 35.1*  PLT 155 187 219    Coag's No results for input(s): APTT, INR in the last 168 hours.  Sepsis Markers No results for input(s): LATICACIDVEN, PROCALCITON, O2SATVEN in the last 168 hours.  ABG Recent Labs  Lab 11/07/17 1830 11/08/17 0508 11/08/17 1908  PHART 7.42 7.42 7.40  PCO2ART 37 34 34  PO2ART 73* 73* 63*    Liver Enzymes Recent Labs  Lab 11/04/17 0630 11/06/17 0426 11/08/17 0458  AST 26  --  75*  ALT 26  --  75*  ALKPHOS 61  --  102  BILITOT 1.4*  --  1.9*  ALBUMIN 3.3* 2.7* 2.2*    Cardiac Enzymes No results for input(s): TROPONINI, PROBNP in the last 168 hours.  Glucose Recent Labs  Lab 11/07/17 1128 11/08/17 0012 11/08/17 0604 11/08/17 1225 11/08/17 1825 11/09/17 0048  GLUCAP 163* 189* 186* 174* 177* 163*  CXR: Mild vascular congestion, mild RLL atelectasis   ASSESSMENT / PLAN: 62 yo AAM admitted for severe angioedema s/p extubation and re-intubated for severe DT's with encephalopathy with severe resp failure on vent   PULMONARY A: Acute hypoxic Respiratory failure secondary to airway compromise from DTs HCAP vs Aspiration pneumonia Angioedema-resolved P:   Endotracheal intubation Continue supplemental oxygen as needed to maintain SPO2 >90% Continue nebulized bronchodilators as needed Culture; started Cefepime  CARDIOVASCULAR A:  Chronic hypertension Transient hypotension post intubation P:  Clonidine patch discontinued Continue  hydralazine prn  RENAL A:   AKI,  Hypernatremia, hyperchloremic azotemia P:   Monitor BMET intermittently Monitor I/Os Will give IV free water  Added free water Electrolytes replacemnet  GASTROINTESTINAL A:   Dysphagia due to AMS P:   SUP: N/I postextubation NGT for enteral nutrition  HEMATOLOGIC A:   Very mild thrombocytopenia P:  DVT px: Enoxaparin Monitor CBC intermittently Transfuse per usual guidelines   INFECTIOUS A:   HCAP vs Aspiration pneumonia P:   Monitor temp, WBC count Cefepime started  ENDOCRINE A:   Mild hyperglycemia without prior history of DM P:   CBGs q 8 hrs Consider SSI for glucose >180  NEUROLOGIC A:   History of heavy alcohol use Alcohol withdrawals with Delirium Tremens Continue thiamine, Folic acid and MVI. P:   Now intubated Propofol and benzodiazepine Lorazepam as needed initiated 04/11 Plan for SAT/SBT daily  Critical Care Time devoted to patient care services described in this note is 32 minutes.   Overall, patient is critically ill, prognosis is guarded.  Patient with Multiorgan failure and at high risk for cardiac arrest and death.    Lucie Leather, M.D.  [facility]  Medical Director [facility] Medical Director [facility] Cardio-Pulmonary Department

## 2017-11-09 NOTE — Progress Notes (Signed)
Pharmacy Antibiotic Note  Dustin Orozco is a 62 y.o. male admitted on 10/29/2017 with angioedema. Patient currently requiring mechanical ventilation and being treated for presumed aspiration pneumonia.  Pharmacy has been consulted for Unasyn dosing.  Plan: Unasyn 3g IV Q12hr. Will monitor renal function every 24-48 hours and adjust accordingly.   Height: 6\' 1"  (185.4 cm) Weight: 200 lb 2.8 oz (90.8 kg) IBW/kg (Calculated) : 79.9  Temp (24hrs), Avg:99.5 F (37.5 C), Min:98.6 F (37 C), Max:100.9 F (38.3 C)  Recent Labs  Lab 11/04/17 0630 11/05/17 0528 11/06/17 0426 11/07/17 0340 11/08/17 0458 11/09/17 0733  WBC 10.4 9.2  --   --  17.6* 24.8*  CREATININE 2.80* 1.87* 1.88* 1.86* 2.66* 3.63*    Estimated Creatinine Clearance: 23.5 mL/min (A) (by C-G formula based on SCr of 3.63 mg/dL (H)).    Allergies  Allergen Reactions  . Lisinopril     Angioedema    Antimicrobials this admission: Cefepime 4/14 >> 4/15 Unasyn 4/16  >>   Dose adjustments this admission: 4/15 Cefepime transitioned from 500mg  to 1000mg    Microbiology results: 4/15 Sputum: abundant staph 4/7  MRSA PCR: negative   Thank you for allowing pharmacy to be a part of this patient's care.  Simpson,Michael L 11/09/2017 10:36 PM

## 2017-11-09 NOTE — Progress Notes (Signed)
Nutrition Follow-up  DOCUMENTATION CODES:   Not applicable  INTERVENTION:  Initiate new goal tube feed regimen of Vital 1.5 at 50 mL/hr (1200 mL goal daily volume) + Pro-Stat 30 mL BID via NGT. Provides 2000 kcal, 111 grams of protein, 912 mL H2O daily.  Provide free water flush of 250 mL Q6hrs for total of 1912 mL H2O daily including water in tube feeds.  Continue liquid MVI daily per tube, folic acid 1 mg daily per tube, thiamine 100 mg daily per tube.  NUTRITION DIAGNOSIS:   Inadequate oral intake related to inability to eat as evidenced by NPO status.  Ongoing - addressing with TF regimen.  GOAL:   Provide needs based on ASPEN/SCCM guidelines  Met with TF regimen.  MONITOR:   Vent status, Labs, Weight trends, TF tolerance, I & O's  REASON FOR ASSESSMENT:   Consult Enteral/tube feeding initiation and management  ASSESSMENT:   62 year old male with PMHx of HTN, arthritis who is admitted with acute angioedema secondary to lisinopril s/p intubation on 4/7 by ENT.  Patient on pressure support mode at time of RD assessment. Abdomen soft today. Patient is now off propofol gtt. Yesterday patient was tachypnic with increased work of breathing but is improved today per discussion in rounds. Not following commands  Access: NGT placed 4/13; terminates in proximal stomach per abdominal x-ray 4/13; 71 cm at right nare  TF: tolerating VHP at 60 mL/hr  Patient is currently intubated on ventilator support MV: 10.6 L/min Temp (24hrs), Avg:99.1 F (37.3 C), Min:98.6 F (37 C), Max:99.9 F (37.7 C)  Propofol: N/A  Medications reviewed and include: folic acid 1 mg daily per tube, FWF 200 mL Q6hrs, liquid MVI daily per tube, Protonix, thiamine 100 mg daily per tube, Unasyn, fentanyl gtt.  Labs reviewed: CBG 163-187 past 24 hrs, Sodium 147 (trending down with FWF), Chloride 116, BUN 103 (trending up), Creatinine 3.63 (trending up), Phosphorus 6.1 (increase from 2.8  yesterday).  I/O: only 812 mL UOP yesterday (0.4 mL/kg/hr)  Discussed with RN and on rounds.  Diet Order:  No diet orders on file  EDUCATION NEEDS:   No education needs have been identified at this time  Skin:  Skin Assessment: Reviewed RN Assessment  Last BM:  11/07/2017 - small type 7  Height:   Ht Readings from Last 1 Encounters:  11/05/2017 _0  (1.854 m)    Weight:   Wt Readings from Last 1 Encounters:  11/09/17 200 lb 2.8 oz (90.8 kg)    Ideal Body Weight:  83.6 kg  BMI:  Body mass index is 26.41 kg/m.  Estimated Nutritional Needs:   Kcal:  2065 (PSU 2003b w/ MSJ 1721, Ve 10.6, Tmax 37.7)  Protein:  104-122 grams (1.2-1.4 grams/kg)  Fluid:  1.8-2 L/day  Willey Blade, MS, RD, LDN Office: [phone] Pager: [phone] After Hours/Weekend Pager: [phone]

## 2017-11-10 LAB — CBC WITH DIFFERENTIAL/PLATELET
Basophils Absolute: 0 10*3/uL (ref 0–0.1)
Basophils Relative: 0 %
EOS PCT: 0 %
Eosinophils Absolute: 0.1 10*3/uL (ref 0–0.7)
HCT: 34.6 % — ABNORMAL LOW (ref 40.0–52.0)
Hemoglobin: 11.6 g/dL — ABNORMAL LOW (ref 13.0–18.0)
LYMPHS ABS: 0.7 10*3/uL — AB (ref 1.0–3.6)
Lymphocytes Relative: 2 %
MCH: 31.8 pg (ref 26.0–34.0)
MCHC: 33.6 g/dL (ref 32.0–36.0)
MCV: 94.7 fL (ref 80.0–100.0)
MONOS PCT: 4 %
Monocytes Absolute: 1.3 10*3/uL — ABNORMAL HIGH (ref 0.2–1.0)
Neutro Abs: 28.5 10*3/uL — ABNORMAL HIGH (ref 1.4–6.5)
Neutrophils Relative %: 94 %
PLATELETS: 252 10*3/uL (ref 150–440)
RBC: 3.66 MIL/uL — ABNORMAL LOW (ref 4.40–5.90)
RDW: 15.7 % — ABNORMAL HIGH (ref 11.5–14.5)
WBC: 30.7 10*3/uL — ABNORMAL HIGH (ref 3.8–10.6)

## 2017-11-10 LAB — BASIC METABOLIC PANEL
Anion gap: 10 (ref 5–15)
BUN: 121 mg/dL — AB (ref 6–20)
CHLORIDE: 120 mmol/L — AB (ref 101–111)
CO2: 23 mmol/L (ref 22–32)
CREATININE: 3.36 mg/dL — AB (ref 0.61–1.24)
Calcium: 7.7 mg/dL — ABNORMAL LOW (ref 8.9–10.3)
GFR calc Af Amer: 21 mL/min — ABNORMAL LOW (ref >60)
GFR calc non Af Amer: 18 mL/min — ABNORMAL LOW (ref >60)
GLUCOSE: 182 mg/dL — AB (ref 65–99)
Potassium: 3.7 mmol/L (ref 3.5–5.1)
SODIUM: 153 mmol/L — AB (ref 135–145)

## 2017-11-10 LAB — CULTURE, RESPIRATORY W GRAM STAIN

## 2017-11-10 LAB — TRIGLYCERIDES: Triglycerides: 299 mg/dL — ABNORMAL HIGH (ref <150)

## 2017-11-10 LAB — GLUCOSE, CAPILLARY
GLUCOSE-CAPILLARY: 164 mg/dL — AB (ref 65–99)
Glucose-Capillary: 171 mg/dL — ABNORMAL HIGH (ref 65–99)
Glucose-Capillary: 182 mg/dL — ABNORMAL HIGH (ref 65–99)
Glucose-Capillary: 188 mg/dL — ABNORMAL HIGH (ref 65–99)
Glucose-Capillary: 195 mg/dL — ABNORMAL HIGH (ref 65–99)
Glucose-Capillary: 197 mg/dL — ABNORMAL HIGH (ref 65–99)

## 2017-11-10 LAB — PHOSPHORUS: Phosphorus: 4.4 mg/dL (ref 2.5–4.6)

## 2017-11-10 LAB — URINALYSIS, COMPLETE (UACMP) WITH MICROSCOPIC
Bacteria, UA: NONE SEEN
Bilirubin Urine: NEGATIVE
GLUCOSE, UA: 50 mg/dL — AB
KETONES UR: NEGATIVE mg/dL
Leukocytes, UA: NEGATIVE
Nitrite: NEGATIVE
PH: 5 (ref 5.0–8.0)
PROTEIN: 30 mg/dL — AB
Specific Gravity, Urine: 1.014 (ref 1.005–1.030)

## 2017-11-10 LAB — PROCALCITONIN: Procalcitonin: 4.97 ng/mL

## 2017-11-10 LAB — MAGNESIUM: MAGNESIUM: 1.8 mg/dL (ref 1.7–2.4)

## 2017-11-10 LAB — CULTURE, RESPIRATORY

## 2017-11-10 MED ORDER — IPRATROPIUM-ALBUTEROL 0.5-2.5 (3) MG/3ML IN SOLN
3.0000 mL | RESPIRATORY_TRACT | Status: DC
  Administered 2017-11-10 – 2017-11-15 (×30): 3 mL via RESPIRATORY_TRACT
  Filled 2017-11-10 (×30): qty 3

## 2017-11-10 MED ORDER — INSULIN ASPART 100 UNIT/ML ~~LOC~~ SOLN
0.0000 [IU] | SUBCUTANEOUS | Status: DC
  Administered 2017-11-10 (×3): 2 [IU] via SUBCUTANEOUS
  Administered 2017-11-10: 3 [IU] via SUBCUTANEOUS
  Administered 2017-11-11: 2 [IU] via SUBCUTANEOUS
  Administered 2017-11-11: 1 [IU] via SUBCUTANEOUS
  Administered 2017-11-11 (×3): 2 [IU] via SUBCUTANEOUS
  Administered 2017-11-11: 1 [IU] via SUBCUTANEOUS
  Administered 2017-11-12: 2 [IU] via SUBCUTANEOUS
  Administered 2017-11-12 (×2): 1 [IU] via SUBCUTANEOUS
  Administered 2017-11-12: 2 [IU] via SUBCUTANEOUS
  Administered 2017-11-13 (×4): 1 [IU] via SUBCUTANEOUS
  Administered 2017-11-13 (×2): 2 [IU] via SUBCUTANEOUS
  Administered 2017-11-14: 1 [IU] via SUBCUTANEOUS
  Filled 2017-11-10 (×21): qty 1

## 2017-11-10 MED ORDER — SODIUM CHLORIDE 0.9 % IV BOLUS
1000.0000 mL | Freq: Once | INTRAVENOUS | Status: AC
  Administered 2017-11-10: 1000 mL via INTRAVENOUS

## 2017-11-10 MED ORDER — CEFAZOLIN SODIUM-DEXTROSE 1-4 GM/50ML-% IV SOLN
1.0000 g | Freq: Two times a day (BID) | INTRAVENOUS | Status: DC
  Administered 2017-11-10 – 2017-11-11 (×3): 1 g via INTRAVENOUS
  Filled 2017-11-10 (×5): qty 50

## 2017-11-10 MED ORDER — AMIODARONE HCL IN DEXTROSE 360-4.14 MG/200ML-% IV SOLN
INTRAVENOUS | Status: AC
  Administered 2017-11-10: 150 mg via INTRAVENOUS
  Filled 2017-11-10: qty 200

## 2017-11-10 MED ORDER — AMIODARONE HCL IN DEXTROSE 360-4.14 MG/200ML-% IV SOLN
60.0000 mg/h | INTRAVENOUS | Status: AC
  Administered 2017-11-10: 60 mg/h via INTRAVENOUS
  Filled 2017-11-10: qty 200

## 2017-11-10 MED ORDER — STERILE WATER FOR INJECTION IJ SOLN
INTRAMUSCULAR | Status: AC
  Administered 2017-11-10: 10 mL
  Filled 2017-11-10: qty 10

## 2017-11-10 MED ORDER — AMIODARONE HCL IN DEXTROSE 360-4.14 MG/200ML-% IV SOLN
60.0000 mg/h | INTRAVENOUS | Status: DC
  Administered 2017-11-10: 30 mg/h via INTRAVENOUS
  Administered 2017-11-11 (×2): 60 mg/h via INTRAVENOUS
  Administered 2017-11-12: 30 mg/h via INTRAVENOUS
  Administered 2017-11-12 (×2): 60 mg/h via INTRAVENOUS
  Administered 2017-11-13 – 2017-11-14 (×4): 30 mg/h via INTRAVENOUS
  Administered 2017-11-15: 60 mg/h via INTRAVENOUS
  Filled 2017-11-10 (×12): qty 200

## 2017-11-10 MED ORDER — AMIODARONE IV BOLUS ONLY 150 MG/100ML
INTRAVENOUS | Status: AC
  Administered 2017-11-10: 150 mg
  Filled 2017-11-10: qty 100

## 2017-11-10 MED ORDER — SENNOSIDES-DOCUSATE SODIUM 8.6-50 MG PO TABS
2.0000 | ORAL_TABLET | Freq: Two times a day (BID) | ORAL | Status: DC
Start: 2017-11-10 — End: 2017-11-16
  Administered 2017-11-10 – 2017-11-16 (×11): 2
  Filled 2017-11-10 (×12): qty 2

## 2017-11-10 MED ORDER — AMIODARONE LOAD VIA INFUSION
150.0000 mg | Freq: Once | INTRAVENOUS | Status: AC
  Administered 2017-11-10: 150 mg via INTRAVENOUS
  Filled 2017-11-10: qty 83.34

## 2017-11-10 MED ORDER — FREE WATER
250.0000 mL | Status: DC
  Administered 2017-11-10 – 2017-11-12 (×10): 250 mL

## 2017-11-10 MED ORDER — NOREPINEPHRINE 4 MG/250ML-% IV SOLN
0.0000 ug/min | INTRAVENOUS | Status: DC
  Administered 2017-11-10: 2 ug/min via INTRAVENOUS
  Administered 2017-11-11: 12 ug/min via INTRAVENOUS
  Filled 2017-11-10 (×2): qty 250

## 2017-11-10 MED ORDER — BUDESONIDE 0.25 MG/2ML IN SUSP
0.2500 mg | Freq: Two times a day (BID) | RESPIRATORY_TRACT | Status: DC
  Administered 2017-11-10 – 2017-11-15 (×11): 0.25 mg via RESPIRATORY_TRACT
  Filled 2017-11-10 (×11): qty 2

## 2017-11-10 MED ORDER — HEPARIN SODIUM (PORCINE) 5000 UNIT/ML IJ SOLN
5000.0000 [IU] | Freq: Three times a day (TID) | INTRAMUSCULAR | Status: DC
  Administered 2017-11-10 – 2017-11-11 (×4): 5000 [IU] via SUBCUTANEOUS
  Filled 2017-11-10 (×4): qty 1

## 2017-11-10 NOTE — Progress Notes (Signed)
Patient ID: Dustin Orozco, male   DOB: 24-Apr-1956, 62 y.o.   MRN: [id number]  [employer] PROGRESS NOTE  Welden Hausmann BMS:[id number] DOB: 28-Feb-1956 DOA: 10/28/2017 PCP: [facility] [place]  HPI/Subjective: Patient intubated and started back on sedation secondary to ventricular tachycardia and increased respiratory rate.  Patient did not respond to commands as per nursing staff when he was off sedation.  Objective: Vitals:   11/10/17 1127 11/10/17 1208  BP:    Pulse: 91   Resp: (!) 32   Temp:    SpO2: 93% 94%    Filed Weights   11/08/17 0446 11/09/17 0416 11/10/17 0320  Weight: 88 kg (194 lb 0.1 oz) 90.8 kg (200 lb 2.8 oz) 90.9 kg (200 lb 6.4 oz)    ROS: Review of Systems  Unable to perform ROS: Acuity of condition   Exam: Physical Exam  HENT:  Nose: No mucosal edema.  Unable to look into mouth  Eyes: Pupils are equal, round, and reactive to light. Conjunctivae are normal.  Neck: No JVD present. Carotid bruit is not present. No edema present. No thyroid mass and no thyromegaly present.  Cardiovascular: S1 normal and S2 normal. Exam reveals no gallop.  No murmur heard. Pulses:      Dorsalis pedis pulses are 2+ on the right side, and 2+ on the left side.  Respiratory: No respiratory distress. He has decreased breath sounds in the right lower field and the left lower field. He has no wheezes. He has no rhonchi. He has no rales.  GI: Soft. Bowel sounds are normal. There is no tenderness.  Musculoskeletal:       Right ankle: He exhibits swelling.       Left ankle: He exhibits swelling.  Lymphadenopathy:    He has no cervical adenopathy.  Neurological:   was unresponsive when I saw him  Skin: Skin is warm. No rash noted. Nails show no clubbing.  Psychiatric:  Was unresponsive when I saw him      Data Reviewed: Basic Metabolic Panel: Recent Labs  Lab 11/06/17 0426 11/07/17 0340 11/07/17 2108 11/08/17 0458 11/09/17 0733 11/10/17 0503  NA 149* 152*  --   152* 147* 153*  K 3.4* 3.5  --  3.4* 4.1 3.7  CL 114* 120*  --  119* 116* 120*  CO2 28 24  --  _0 GLUCOSE 181* 200*  --  184* 163* 182*  BUN 86* 102*  --  97* 103* 121*  CREATININE 1.88* 1.86*  --  2.66* 3.63* 3.36*  CALCIUM 8.8* 8.5*  --  7.7* 7.4* 7.7*  MG 2.3 2.2  --  1.9 1.8 1.8  PHOS 2.5 1.5* 3.8 2.8 6.1* 4.4   Liver Function Tests: Recent Labs  Lab 11/04/17 0630 11/06/17 0426 11/08/17 0458  AST 26  --  75*  ALT 26  --  75*  ALKPHOS 61  --  102  BILITOT 1.4*  --  1.9*  PROT 7.5  --  5.5*  ALBUMIN 3.3* 2.7* 2.2*    Recent Labs  Lab 11/06/17 0426 11/07/17 1343  AMMONIA 29 32   CBC: Recent Labs  Lab 11/04/17 0630 11/05/17 0528 11/08/17 0458 11/09/17 0733 11/10/17 0503  WBC 10.4 9.2 17.6* 24.8* 30.7*  NEUTROABS  --   --  15.6* 23.1* 28.5*  HGB 13.2 12.7* 11.5* 11.9* 11.6*  HCT 36.9* 36.4* 32.9* 35.1* 34.6*  MCV 92.3 92.8 95.5 96.6 94.7  PLT 143* 155 187 219 252   4CBG:  Recent Labs  Lab 11/09/17 1847 11/09/17 2320 11/10/17 0543 11/10/17 0951 11/10/17 1157  GLUCAP 165* 184* 182* 195* 197*    Recent Results (from the past 240 hour(s))  MRSA PCR Screening     Status: None   Collection Time: 11/02/2017 11:38 PM  Result Value Ref Range Status   MRSA by PCR NEGATIVE NEGATIVE Final    Comment:        The GeneXpert MRSA Assay (FDA approved for NASAL specimens only), is one component of a comprehensive MRSA colonization surveillance program. It is not intended to diagnose MRSA infection nor to guide or monitor treatment for MRSA infections. Performed at [facility], [address] [place], [place] [address]   Culture, respiratory (NON-Expectorated)     Status: None   Collection Time: 11/08/17  5:14 AM  Result Value Ref Range Status   Specimen Description   Final    TRACHEAL ASPIRATE Performed at [facility], [address] [place], [place] [address]    Special Requests   Final    NONE Performed at [facility], [address] [place], [place] [address]    Gram Stain   Final    FEW WBC PRESENT, PREDOMINANTLY PMN ABUNDANT GRAM POSITIVE COCCI ABUNDANT GRAM NEGATIVE RODS Performed at [facility], [address] [place], [place] [address]    Culture ABUNDANT STAPHYLOCOCCUS AUREUS  Final   Report Status 11/10/2017 FINAL  Final   Organism ID, Bacteria STAPHYLOCOCCUS AUREUS  Final      Susceptibility   Staphylococcus aureus - MIC*    CIPROFLOXACIN <=0.5 SENSITIVE Sensitive     ERYTHROMYCIN <=0.25 SENSITIVE Sensitive     GENTAMICIN <=0.5 SENSITIVE Sensitive     OXACILLIN 0.5 SENSITIVE Sensitive     TETRACYCLINE <=1 SENSITIVE Sensitive     VANCOMYCIN 1 SENSITIVE Sensitive     TRIMETH/SULFA <=10 SENSITIVE Sensitive     CLINDAMYCIN <=0.25 SENSITIVE Sensitive     RIFAMPIN <=0.5 SENSITIVE Sensitive     Inducible Clindamycin NEGATIVE Sensitive     * ABUNDANT STAPHYLOCOCCUS AUREUS  Culture, respiratory (NON-Expectorated)     Status: None (Preliminary result)   Collection Time: 11/10/17 11:13 AM  Result Value Ref Range Status   Specimen Description   Final    TRACHEAL ASPIRATE Performed at [facility], [address] [place], [place] [address]    Special Requests   Final    NONE Performed at [facility], [address] [place], [place] [address]    Gram Stain   Final    RARE WBC PRESENT, PREDOMINANTLY PMN RARE GRAM NEGATIVE COCCOBACILLI Performed at [facility], [address] [place],  [address]    Culture PENDING  Incomplete   Report Status PENDING  Incomplete     Studies: No results found.  Scheduled Meds: . sterile water (preservative free)      . budesonide (PULMICORT) nebulizer solution  0.25 mg Nebulization BID  . chlorhexidine gluconate (MEDLINE KIT)  15 mL Mouth Rinse BID  . feeding supplement (PRO-STAT SUGAR FREE 64)  30 mL Per Tube BID  . folic acid  1 mg Per Tube Daily  . free water  250 mL Per Tube Q4H  . heparin  injection (subcutaneous)  5,000 Units Subcutaneous Q8H  . insulin aspart  0-9 Units Subcutaneous Q4H  . ipratropium-albuterol  3 mL Nebulization Q4H  . mouth rinse  15 mL Mouth Rinse 10 times per day  . metoCLOPramide (REGLAN)   injection  10 mg Intravenous Once  . multivitamin  15 mL Per Tube Daily  . pantoprazole sodium  40 mg Per Tube BID  . senna-docusate  2 tablet Per Tube BID  . thiamine  100 mg Per Tube Daily   Continuous Infusions: . amiodarone 60 mg/hr (11/10/17 1419)   Followed by  . amiodarone    .  ceFAZolin (ANCEF) IV Stopped (11/10/17 1419)  . feeding supplement (VITAL 1.5 CAL) 1,000 mL (11/10/17 0925)  . fentaNYL infusion INTRAVENOUS Stopped (11/10/17 0920)    Assessment/Plan:  1. Delirium tremens and alcohol withdrawal.  Librium via NG tube.  Now the patient is back on the ventilator can go through the risks of withdrawal process while on the vent.  When I saw him today we were still waiting for him to wake up off sedation. 2. Fever with Mssa pneumonia on cefazolin.  Pro-calcitonin elevated. 3. SVT, atrial fibrillation, nonsustained ventricular tachycardia.  Patient started on amiodarone drip 4. Hypotension off pressors at this point 5. Acute hypoxic respiratory failure.  patient on 35% FiO2 6. Angioedema secondary to lisinopril.  This has improved with supportive care. 7. Acute kidney injury.  Creatinine peaked at 5.33 and came down to 1.86 and then worsened again and now trending better down to 3.36. 8. Hypernatremia on free water 9. NG tube feeding 10. Hypomagnesemia replaced 11. Acute encephalopathy.  Mental status did not improve once off fentanyl drip and restarted back on fentanyl drip.  Can consider CT scan of the head.  Code Status:     Code Status Orders  (From admission, onward)        Start     Ordered   11/05/2017 2322  Full code  Continuous     11/16/2017 2321    Code Status History    This patient has a current code status but no historical code  status.     Family Communication: As per critical care specialist  disposition Plan: To be determined  Consultants:  Critical care team  Time spent: 25 minutes Case discussed with critical care nursing staff.  Richard Wieting  [employer]         

## 2017-11-10 NOTE — Progress Notes (Signed)
PULMONARY / CRITICAL CARE MEDICINE   Name: Dustin Orozco MRN: [id number] DOB: May 21, 1956    ADMISSION DATE:  10/26/2017  PT PROFILE:   37 M on chronic ACE inhibitor therapy, nasally intubated intubated by ENT in ED for angioedema with severe macroglossia and lip swelling.  MAJOR EVENTS/TEST RESULTS: 04/08 developed anuric renal failure 04/08 Renal US: no hydro, bladder decompressed 04/09 Renal consultation: "Acute renal failure is likely secondary to severe ATN likely from hypotension.  Renal ultrasound is negative for obstruction" 04/10 Extubated. Tolerated well but requiring [place] O2 @ 6 LPM 04/11 tolerating extubation well.  However, increasing agitated delirium.  Wife reports heavy chronic alcohol use.  Lorazepam initiated.  Persistent agitation.  Dexmedetomidine infusion initiated. 04/12 BIPAP initiated las night 04/14 Intubated 4/15 remains intubated 4/17 remains delerious and intubated  INDWELLING DEVICES:: ETT (L naris) 04/07 >> 04/10  MICRO DATA: MRSA PCR 04/07 >> NEG  ANTIMICROBIALS:    SUBJECTIVE:  Severe resp failure On vent  remains critically ill Remains delerious    VITAL SIGNS: BP 109/66   Pulse (!) 103   Temp (!) 101.4 F (38.6 C) (Oral)   Resp (!) 22   Ht 6\' 1"  (1.854 m)   Wt 200 lb 6.4 oz (90.9 kg)   SpO2 95%   BMI 26.44 kg/m       VENTILATOR SETTINGS: Vent Mode: PRVC FiO2 (%):  [35 %-40 %] 35 % Set Rate:  [16 bmp] 16 bmp Vt Set:  [500 mL] 500 mL PEEP:  [5 cmH20] 5 cmH20 Plateau Pressure:  [12 cmH20-16 cmH20] 12 cmH20 BIPAP for sleep  INTAKE / OUTPUT: I/O last 3 completed shifts: In: 3477.4 [I.V.:807.4; Other:90; ON/GE:9528; IV Piggyback:100] Out: 2968 [Urine:2968]  PHYSICAL EXAMINATION: General:  on vent GCS<8T Neuro: No focal neurological deficits, confused, agitated HEENT: NCAT, sclerae white Cardiovascular: RRR, no M Lungs: Rhonchi on right, clear on left  Abdomen: Soft, NT, NABS Extremities: Warm, no  edema  LABS:  BMET Recent Labs  Lab 11/08/17 0458 11/09/17 0733 11/10/17 0503  NA 152* 147* 153*  K 3.4* 4.1 3.7  CL 119* 116* 120*  CO2 23 23 23   BUN 97* 103* 121*  CREATININE 2.66* 3.63* 3.36*  GLUCOSE 184* 163* 182*    Electrolytes Recent Labs  Lab 11/08/17 0458 11/09/17 0733 11/10/17 0503  CALCIUM 7.7* 7.4* 7.7*  MG 1.9 1.8 1.8  PHOS 2.8 6.1* 4.4    CBC Recent Labs  Lab 11/08/17 0458 11/09/17 0733 11/10/17 0503  WBC 17.6* 24.8* 30.7*  HGB 11.5* 11.9* 11.6*  HCT 32.9* 35.1* 34.6*  PLT 187 219 252    Coag's No results for input(s): APTT, INR in the last 168 hours.  Sepsis Markers Recent Labs  Lab 11/10/17 0825  PROCALCITON 4.97    ABG Recent Labs  Lab 11/07/17 1830 11/08/17 0508 11/08/17 1908  PHART 7.42 7.42 7.40  PCO2ART 37 34 34  PO2ART 73* 73* 63*    Liver Enzymes Recent Labs  Lab 11/04/17 0630 11/06/17 0426 11/08/17 0458  AST 26  --  75*  ALT 26  --  75*  ALKPHOS 61  --  102  BILITOT 1.4*  --  1.9*  ALBUMIN 3.3* 2.7* 2.2*    Cardiac Enzymes No results for input(s): TROPONINI, PROBNP in the last 168 hours.  Glucose Recent Labs  Lab 11/09/17 0048 11/09/17 0608 11/09/17 1145 11/09/17 1847 11/09/17 2320 11/10/17 0543  GLUCAP 163* 132* 187* 165* 184* 182*    CXR: Mild vascular congestion, mild RLL  atelectasis   ASSESSMENT / PLAN: 62 yo AAM admitted for severe angioedema s/p extubation and re-intubated for severe DT's with encephalopathy with severe resp failure on vent   PULMONARY A: Acute hypoxic Respiratory failure secondary to airway compromise from DTs HCAP vs Aspiration pneumonia Angioedema-resolved P:   Endotracheal intubation Continue supplemental oxygen as needed to maintain SPO2 >90% Continue nebulized bronchodilators as needed Culture; started Cefepime  CARDIOVASCULAR A:  Chronic hypertension Transient hypotension post intubation P:  Clonidine patch discontinued Continue hydralazine  prn  RENAL A:   AKI,  Hypernatremia, hyperchloremic azotemia P:   Monitor BMET intermittently Monitor I/Os Will give IV free water  Added free water Electrolytes replacemnet  GASTROINTESTINAL A:   Dysphagia due to AMS P:   SUP: N/I postextubation NGT for enteral nutrition  HEMATOLOGIC A:   Very mild thrombocytopenia P:  DVT px: Enoxaparin Monitor CBC intermittently Transfuse per usual guidelines   INFECTIOUS A:   HCAP vs Aspiration pneumonia P:   Monitor temp, WBC count Cefepime started  ENDOCRINE A:   Mild hyperglycemia without prior history of DM P:   CBGs q 8 hrs Consider SSI for glucose >180  NEUROLOGIC A:   History of heavy alcohol use Alcohol withdrawals with Delirium Tremens Continue thiamine, Folic acid and MVI. P:   Now intubated Propofol and benzodiazepine-wean to off and assess neuro status Plan for SAT/SBT daily  Critical Care Time devoted to patient care services described in this note is 31 minutes.   Overall, patient is critically ill, prognosis is guarded.  Patient with Multiorgan failure and at high risk for cardiac arrest and death.    Lucie LeatherKurian David Trinna Kunst, M.D.  [facility]  Medical Director [facility] Medical Director [facility]

## 2017-11-10 NOTE — Progress Notes (Signed)
Pharmacy Antibiotic Note  Elenora Gammaustin Dupuis is a 62 y.o. male admitted on 11/04/2017 with angioedema. Patient currently requiring mechanical ventilation and being treated for MSSA pneumonia.  Pharmacy has been consulted for Cefazolin dosing.  Plan: Cefazolin 1g IV Q12hr. Will monitor renal function every 24-48 hours and adjust accordingly.   Height: 6\' 1"  (185.4 cm) Weight: 200 lb 6.4 oz (90.9 kg) IBW/kg (Calculated) : 79.9  Temp (24hrs), Avg:100.5 F (38.1 C), Min:98.7 F (37.1 C), Max:102.2 F (39 C)  Recent Labs  Lab 11/04/17 0630 11/05/17 0528 11/06/17 0426 11/07/17 0340 11/08/17 0458 11/09/17 0733 11/10/17 0503  WBC 10.4 9.2  --   --  17.6* 24.8* 30.7*  CREATININE 2.80* 1.87* 1.88* 1.86* 2.66* 3.63* 3.36*    Estimated Creatinine Clearance: 25.4 mL/min (A) (by C-G formula based on SCr of 3.36 mg/dL (H)).    Allergies  Allergen Reactions  . Lisinopril     Angioedema    Antimicrobials this admission: Cefepime 4/14 >> 4/15 Unasyn 4/16  >> 4/17 Cefazolin 4/17 >>  Dose adjustments this admission: 4/15 Cefepime transitioned from 500mg  to 1000mg    Microbiology results: 4/17 Susceptibility: pan sensitive MSSA 4/15 Sputum: abundant staph 4/7  MRSA PCR: negative   Thank you for allowing pharmacy to be a part of this patient's care.  Myrtha Mantislizabeth Ryaan Vanwagoner 11/10/2017 2:35 PM

## 2017-11-10 NOTE — Progress Notes (Signed)
Pharmacy Electrolyte Monitoring Consult:  Pharmacy consulted to assist in monitoring and replacing electrolytes in this 62 y.o. male admitted on 11/10/2017 with Allergic Reaction   Labs:  Sodium (mmol/L)  Date Value  11/10/2017 153 (H)   Potassium (mmol/L)  Date Value  11/10/2017 3.7   Magnesium (mg/dL)  Date Value  16/10/960404/17/2019 1.8   Phosphorus (mg/dL)  Date Value  54/09/811904/17/2019 4.4   Calcium (mg/dL)  Date Value  14/78/295604/17/2019 7.7 (L)   Albumin (g/dL)  Date Value  21/30/865704/15/2019 2.2 (L)    Plan: No replacement warranted at this time.   Pharmacy will continue to monitor and adjust per consult.   Simpson,Michael L 11/10/2017 11:16 PM

## 2017-11-11 ENCOUNTER — Inpatient Hospital Stay: Payer: Medicare Other

## 2017-11-11 DIAGNOSIS — J1520 Pneumonia due to staphylococcus, unspecified: Secondary | ICD-10-CM

## 2017-11-11 DIAGNOSIS — J9600 Acute respiratory failure, unspecified whether with hypoxia or hypercapnia: Secondary | ICD-10-CM

## 2017-11-11 LAB — BLOOD GAS, ARTERIAL
ACID-BASE EXCESS: 2.2 mmol/L — AB (ref 0.0–2.0)
Acid-base deficit: 1.9 mmol/L (ref 0.0–2.0)
Acid-base deficit: 3.1 mmol/L — ABNORMAL HIGH (ref 0.0–2.0)
BICARBONATE: 22.1 mmol/L (ref 20.0–28.0)
BICARBONATE: 21.1 mmol/L (ref 20.0–28.0)
Bicarbonate: 24.7 mmol/L (ref 20.0–28.0)
FIO2: 0.32
FIO2: 50
FIO2: 0.4
LHR: 16 {breaths}/min
O2 SAT: 94.4 %
O2 Saturation: 94.8 %
O2 Saturation: 91.7 %
PATIENT TEMPERATURE: 37
PATIENT TEMPERATURE: 37
PCO2 ART: 31 mmHg — AB (ref 32.0–48.0)
PEEP/CPAP: 5 cmH2O
PH ART: 7.42 (ref 7.350–7.450)
PO2 ART: 65 mmHg — AB (ref 83.0–108.0)
PO2 ART: 63 mmHg — AB (ref 83.0–108.0)
Patient temperature: 37
VT: 500 mL
pCO2 arterial: 34 mmHg (ref 32.0–48.0)
pCO2 arterial: 34 mmHg (ref 32.0–48.0)
pH, Arterial: 7.51 — ABNORMAL HIGH (ref 7.350–7.450)
pH, Arterial: 7.4 (ref 7.350–7.450)
pO2, Arterial: 73 mmHg — ABNORMAL LOW (ref 83.0–108.0)

## 2017-11-11 LAB — RENAL FUNCTION PANEL
ANION GAP: 9 (ref 5–15)
Albumin: 1.7 g/dL — ABNORMAL LOW (ref 3.5–5.0)
Albumin: 1.7 g/dL — ABNORMAL LOW (ref 3.5–5.0)
Anion gap: 11 (ref 5–15)
BUN: 137 mg/dL — AB (ref 6–20)
BUN: 129 mg/dL — ABNORMAL HIGH (ref 6–20)
CHLORIDE: 117 mmol/L — AB (ref 101–111)
CHLORIDE: 116 mmol/L — AB (ref 101–111)
CO2: 25 mmol/L (ref 22–32)
CO2: 27 mmol/L (ref 22–32)
CREATININE: 4.15 mg/dL — AB (ref 0.61–1.24)
Calcium: 7.6 mg/dL — ABNORMAL LOW (ref 8.9–10.3)
Calcium: 7.6 mg/dL — ABNORMAL LOW (ref 8.9–10.3)
Creatinine, Ser: 4.4 mg/dL — ABNORMAL HIGH (ref 0.61–1.24)
GFR calc Af Amer: 16 mL/min — ABNORMAL LOW (ref >60)
GFR calc non Af Amer: 14 mL/min — ABNORMAL LOW (ref >60)
GFR calc non Af Amer: 13 mL/min — ABNORMAL LOW (ref >60)
GFR, EST AFRICAN AMERICAN: 15 mL/min — AB (ref >60)
GLUCOSE: 164 mg/dL — AB (ref 65–99)
Glucose, Bld: 172 mg/dL — ABNORMAL HIGH (ref 65–99)
POTASSIUM: 4.6 mmol/L (ref 3.5–5.1)
Phosphorus: 7.5 mg/dL — ABNORMAL HIGH (ref 2.5–4.6)
Phosphorus: 8.4 mg/dL — ABNORMAL HIGH (ref 2.5–4.6)
Potassium: 4.9 mmol/L (ref 3.5–5.1)
Sodium: 153 mmol/L — ABNORMAL HIGH (ref 135–145)
Sodium: 152 mmol/L — ABNORMAL HIGH (ref 135–145)

## 2017-11-11 LAB — MAGNESIUM
MAGNESIUM: 2 mg/dL (ref 1.7–2.4)
MAGNESIUM: 1.9 mg/dL (ref 1.7–2.4)
Magnesium: 1.9 mg/dL (ref 1.7–2.4)

## 2017-11-11 LAB — BASIC METABOLIC PANEL
ANION GAP: 9 (ref 5–15)
BUN: 139 mg/dL — AB (ref 6–20)
CO2: 25 mmol/L (ref 22–32)
Calcium: 7.5 mg/dL — ABNORMAL LOW (ref 8.9–10.3)
Chloride: 120 mmol/L — ABNORMAL HIGH (ref 101–111)
Creatinine, Ser: 3.56 mg/dL — ABNORMAL HIGH (ref 0.61–1.24)
GFR, EST AFRICAN AMERICAN: 19 mL/min — AB (ref >60)
GFR, EST NON AFRICAN AMERICAN: 17 mL/min — AB (ref >60)
Glucose, Bld: 191 mg/dL — ABNORMAL HIGH (ref 65–99)
POTASSIUM: 4.3 mmol/L (ref 3.5–5.1)
SODIUM: 154 mmol/L — AB (ref 135–145)

## 2017-11-11 LAB — GLUCOSE, CAPILLARY
GLUCOSE-CAPILLARY: 150 mg/dL — AB (ref 65–99)
Glucose-Capillary: 185 mg/dL — ABNORMAL HIGH (ref 65–99)
Glucose-Capillary: 176 mg/dL — ABNORMAL HIGH (ref 65–99)
Glucose-Capillary: 155 mg/dL — ABNORMAL HIGH (ref 65–99)
Glucose-Capillary: 140 mg/dL — ABNORMAL HIGH (ref 65–99)

## 2017-11-11 LAB — PHOSPHORUS: Phosphorus: 6.4 mg/dL — ABNORMAL HIGH (ref 2.5–4.6)

## 2017-11-11 MED ORDER — FUROSEMIDE 10 MG/ML IJ SOLN
80.0000 mg | Freq: Once | INTRAMUSCULAR | Status: AC
  Administered 2017-11-11: 80 mg via INTRAVENOUS
  Filled 2017-11-11: qty 8

## 2017-11-11 MED ORDER — HEPARIN SODIUM (PORCINE) 5000 UNIT/ML IJ SOLN
5000.0000 [IU] | Freq: Three times a day (TID) | INTRAMUSCULAR | Status: DC
  Administered 2017-11-12 – 2017-11-16 (×14): 5000 [IU] via SUBCUTANEOUS
  Filled 2017-11-11 (×14): qty 1

## 2017-11-11 MED ORDER — STERILE WATER FOR INJECTION IJ SOLN
INTRAMUSCULAR | Status: AC
  Administered 2017-11-11: 10 mL
  Filled 2017-11-11: qty 10

## 2017-11-11 MED ORDER — FENTANYL CITRATE (PF) 100 MCG/2ML IJ SOLN
INTRAMUSCULAR | Status: AC
  Filled 2017-11-11: qty 2

## 2017-11-11 MED ORDER — NOREPINEPHRINE BITARTRATE 1 MG/ML IV SOLN
0.0000 ug/min | INTRAVENOUS | Status: DC
  Administered 2017-11-11: 18 ug/min via INTRAVENOUS
  Administered 2017-11-12: 8 ug/min via INTRAVENOUS
  Administered 2017-11-12: 30 ug/min via INTRAVENOUS
  Administered 2017-11-12: 40 ug/min via INTRAVENOUS
  Filled 2017-11-11 (×3): qty 16

## 2017-11-11 MED ORDER — LIDOCAINE HCL (PF) 1 % IJ SOLN
INTRAMUSCULAR | Status: AC | PRN
  Administered 2017-11-11: 5 mL

## 2017-11-11 MED ORDER — HEPARIN SODIUM (PORCINE) 1000 UNIT/ML DIALYSIS
1000.0000 [IU] | INTRAMUSCULAR | Status: DC | PRN
  Administered 2017-11-12: 1400 [IU] via INTRAVENOUS_CENTRAL
  Filled 2017-11-11: qty 6
  Filled 2017-11-11: qty 1
  Filled 2017-11-11 (×2): qty 6
  Filled 2017-11-11: qty 4
  Filled 2017-11-11: qty 1

## 2017-11-11 MED ORDER — MIDAZOLAM HCL 5 MG/5ML IJ SOLN
INTRAMUSCULAR | Status: AC
  Filled 2017-11-11: qty 5

## 2017-11-11 MED ORDER — DEXTROSE 5 % IV SOLN
INTRAVENOUS | Status: DC
  Administered 2017-11-11 – 2017-11-16 (×2): via INTRAVENOUS

## 2017-11-11 MED ORDER — PUREFLOW DIALYSIS SOLUTION
INTRAVENOUS | Status: DC
  Administered 2017-11-13 – 2017-11-14 (×2): 3 via INTRAVENOUS_CENTRAL
  Administered 2017-11-14 – 2017-11-15 (×2): via INTRAVENOUS_CENTRAL

## 2017-11-11 MED ORDER — CEFAZOLIN SODIUM-DEXTROSE 2-4 GM/100ML-% IV SOLN
2.0000 g | Freq: Three times a day (TID) | INTRAVENOUS | Status: DC
  Administered 2017-11-11 – 2017-11-15 (×11): 2 g via INTRAVENOUS
  Filled 2017-11-11 (×13): qty 100

## 2017-11-11 MED ORDER — HEPARIN SODIUM (PORCINE) 1000 UNIT/ML IJ SOLN
1000.0000 [IU] | INTRAMUSCULAR | Status: DC | PRN
  Administered 2017-11-11: 3000 [IU] via INTRAVENOUS
  Filled 2017-11-11: qty 4

## 2017-11-11 NOTE — Progress Notes (Signed)
On arrival to shift, orders to start patient on CRRT. No consent, Dr. Cherylann RatelLateef called, spoke with pt daughter over the phone and consent obtained. Consent placed on chart for MD to sign in AM. CRRT started per MD order. Pt HR still fluctuating, and pt still remaining on Levophed gtt. Will continue to monitor.

## 2017-11-11 NOTE — Progress Notes (Signed)
I reviewed case with Dr Grace Isaac with IR CT shows RT sided HydroPTX CT catheter  ordered placed for chest tube placement

## 2017-11-11 NOTE — Progress Notes (Signed)
Pharmacy Antibiotic Note  Dustin Orozco is a 62 y.o. male admitted on 11/03/2017 with angioedema. Patient currently requiring mechanical ventilation and being treated for MSSA pneumonia.  Pharmacy has been consulted for Cefazolin dosing. Patient initiated on CRRT on 4/18.   Plan: Transition to cefazolin 2g IV Q8hr.   Height: 6\' 1"  (185.4 cm) Weight: 200 lb 13.4 oz (91.1 kg) IBW/kg (Calculated) : 79.9  Temp (24hrs), Avg:100 F (37.8 C), Min:99.1 F (37.3 C), Max:100.8 F (38.2 C)  Recent Labs  Lab 11/05/17 0528  11/08/17 0458 11/09/17 0733 11/10/17 0503 11/11/17 0430 11/11/17 1829  WBC 9.2  --  17.6* 24.8* 30.7*  --   --   CREATININE 1.87*   < > 2.66* 3.63* 3.36* 3.56* 4.15*   < > = values in this interval not displayed.    Estimated Creatinine Clearance: 20.6 mL/min (A) (by C-G formula based on SCr of 4.15 mg/dL (H)).    Allergies  Allergen Reactions  . Lisinopril     Angioedema    Antimicrobials this admission: Cefepime 4/14 >> 4/15 Unasyn 4/16  >> 4/17 Cefazolin 4/17 >>  Dose adjustments this admission: 4/15 Cefepime transitioned from 500mg  to 1000mg    Microbiology results: 4/17 Susceptibility: pan sensitive MSSA 4/15 Sputum: abundant staph 4/7  MRSA PCR: negative   Thank you for allowing pharmacy to be a part of this patient's care.  Dustin Orozco 11/11/2017 9:22 PM

## 2017-11-11 NOTE — Plan of Care (Signed)
  Problem: Clinical Measurements: Goal: Ability to maintain clinical measurements within normal limits will improve Outcome: Progressing Goal: Will remain free from infection Outcome: Progressing   Problem: Elimination: Goal: Will not experience complications related to urinary retention Outcome: Progressing   Problem: Safety: Goal: Ability to remain free from injury will improve Outcome: Progressing   Problem: Education: Goal: Knowledge of General Education information will improve Outcome: Not Met (add Reason) Note:  Pt is sedated and intubated   Problem: Clinical Measurements: Goal: Respiratory complications will improve Outcome: Not Met (add Reason) Note:  Pt remains on ventilator with o2 at 90%

## 2017-11-11 NOTE — Progress Notes (Signed)
PULMONARY / CRITICAL CARE MEDICINE   Name: Mehran Orozco MRN: [id number] DOB: [birth year]    ADMISSION DATE:  11/05/2017  PT PROFILE:   42 M on chronic ACE inhibitor therapy, nasally intubated intubated by ENT in ED for angioedema with severe macroglossia and lip swelling.  MAJOR EVENTS/TEST RESULTS: 04/08 developed anuric renal failure 04/08 Renal US: no hydro, bladder decompressed 04/09 Renal consultation: "Acute renal failure is likely secondary to severe ATN likely from hypotension.  Renal ultrasound is negative for obstruction" 04/10 Extubated. Tolerated well but requiring [place] O2 @ 6 LPM 04/11 tolerating extubation well.  However, increasing agitated delirium.  Wife reports heavy chronic alcohol use.  Lorazepam initiated.  Persistent agitation.  Dexmedetomidine infusion initiated. 04/12 BIPAP initiated las night 04/14 Intubated 4/15 remains intubated 4/17 remains delerious and intubated   INDWELLING DEVICES:: ETT (L naris) 04/07 >> 04/10  MICRO DATA: MRSA PCR 04/07 >> NEG   Anti-infectives (From admission, onward)   Start     Dose/Rate Route Frequency Ordered Stop   11/10/17 1245  ceFAZolin (ANCEF) IVPB 1 g/50 mL premix     1 g 100 mL/hr over 30 Minutes Intravenous Every 12 hours 11/10/17 1237     11/09/17 2200  Ampicillin-Sulbactam (UNASYN) 3 g in sodium chloride 0.9 % 100 mL IVPB  Status:  Discontinued     3 g 200 mL/hr over 30 Minutes Intravenous Every 12 hours 11/09/17 1649 11/10/17 1004   11/09/17 1115  Ampicillin-Sulbactam (UNASYN) 3 g in sodium chloride 0.9 % 100 mL IVPB  Status:  Discontinued     3 g 200 mL/hr over 30 Minutes Intravenous Every 12 hours 11/09/17 1112 11/09/17 1649   11/08/17 2200  ceFEPIme (MAXIPIME) 1 g in sodium chloride 0.9 % 100 mL IVPB  Status:  Discontinued     1 g 200 mL/hr over 30 Minutes Intravenous Every 24 hours 11/08/17 1551 11/09/17 1005   11/07/17 2200  ceFEPIme (MAXIPIME) 500 mg in dextrose 5 % 50 mL IVPB  Status:  Discontinued     500  mg 100 mL/hr over 30 Minutes Intravenous Every 24 hours 11/07/17 1808 11/08/17 1551        SUBJECTIVE:  Severe resp failure On vent  remains critically ill Remains delirious, comatose +STAPH pneumonia CT head pending     VITAL SIGNS: BP (!) 83/52   Pulse 70   Temp 99.7 F (37.6 C) (Oral)   Resp (!) 24   Ht 6\' 1"  (1.854 m)   Wt 200 lb 13.4 oz (91.1 kg)   SpO2 95%   BMI 26.50 kg/m       VENTILATOR SETTINGS: Vent Mode: PRVC FiO2 (%):  [35 %-100 %] 70 % Set Rate:  [16 bmp] 16 bmp Vt Set:  [500 mL-800 mL] 500 mL PEEP:  [5 cmH20] 5 cmH20 Plateau Pressure:  [13 cmH20] 13 cmH20 BIPAP for sleep  INTAKE / OUTPUT: I/O last 3 completed shifts: In: 3677 [I.V.:1398; NG/GT:1950; IV Piggyback:329] Out: 2785 [Urine:2785]  PHYSICAL EXAMINATION: General:  on vent GCS<8T Neuro: No focal neurological deficits, confused, agitated HEENT: NCAT, sclerae white Cardiovascular: RRR, no M Lungs: Rhonchi on right, clear on left  Abdomen: Soft, NT, NABS Extremities: Warm, no edema  LABS:  BMET Recent Labs  Lab 11/09/17 0733 11/10/17 0503 11/11/17 0430  NA 147* 153* 154*  K 4.1 3.7 4.3  CL 116* 120* 120*  CO2 23 23 25   BUN 103* 121* 139*  CREATININE 3.63* 3.36* 3.56*  GLUCOSE 163* 182* 191*  Electrolytes Recent Labs  Lab 11/09/17 0733 11/10/17 0503 11/11/17 0430  CALCIUM 7.4* 7.7* 7.5*  MG 1.8 1.8 2.0  PHOS 6.1* 4.4 6.4*    CBC Recent Labs  Lab 11/08/17 0458 11/09/17 0733 11/10/17 0503  WBC 17.6* 24.8* 30.7*  HGB 11.5* 11.9* 11.6*  HCT 32.9* 35.1* 34.6*  PLT 187 219 252    Coag's No results for input(s): APTT, INR in the last 168 hours.  Sepsis Markers Recent Labs  Lab 11/10/17 0825  PROCALCITON 4.97    ABG Recent Labs  Lab 11/07/17 1830 11/08/17 0508 11/08/17 1908  PHART 7.42 7.42 7.40  PCO2ART 37 34 34  PO2ART 73* 73* 63*    Liver Enzymes Recent Labs  Lab 11/06/17 0426 11/08/17 0458  AST  --  75*  ALT  --  75*  ALKPHOS  --   102  BILITOT  --  1.9*  ALBUMIN 2.7* 2.2*    Cardiac Enzymes No results for input(s): TROPONINI, PROBNP in the last 168 hours.  Glucose Recent Labs  Lab 11/10/17 1157 11/10/17 1611 11/10/17 1938 11/10/17 2345 11/11/17 0343 11/11/17 0735  GLUCAP 197* 188* 164* 171* 185* 176*    CXR: Mild vascular congestion, mild RLL atelectasis ASSESSMENT / PLAN: 62 yo AAM admitted for severe angioedema s/p extubation and re-intubated for severe DT's with encephalopathy with severe resp failure on vent complicated by STAPH pneumonia with persistent comatosed state   PULMONARY A: Acute hypoxic Respiratory failure secondary to airway compromise from DTs STAPH pneumonia Angioedema-resolved P:   Continue supplemental oxygen as needed to maintain SPO2 >90% Continue nebulized bronchodilators as needed ABX as listed above  CARDIOVASCULAR A: septic shock Vasopressors as needed keep MAP>65 Check LA Consider stress dose steroids  RENAL A:   AKI,  Hypernatremia, hyperchloremic azotemia P:   Monitor BMET intermittently Monitor I/Os Added free water Electrolytes replacement  GASTROINTESTINAL A:   Dysphagia due to AMS P:   SUP: N/I postextubation NGT for enteral nutrition TF's at goal  HEMATOLOGIC A:   Very mild thrombocytopenia P:  DVT px: Enoxaparin Monitor CBC intermittently Transfuse per usual guidelines   INFECTIOUS STAPH PNEUMONIA CEFAZOLIN  ENDOCRINE A:   Mild hyperglycemia without prior history of DM P:   CBGs q 8 hrs Consider SSI for glucose >180  NEUROLOGIC A:   History of heavy alcohol use Alcohol withdrawals with Delirium Tremens Continue thiamine, Folic acid and MVI. Has failed multiple SAT/SBT's Plan for CT head   Critical Care Time devoted to patient care services described in this note is 33 minutes.   Overall, patient is critically ill, prognosis is guarded.  Patient with Multiorgan failure and at high risk for cardiac arrest and death.     Lucie LeatherKurian David Isaac Dubie, M.D.  [facility]  Medical Director [facility] Medical Director [facility]

## 2017-11-11 NOTE — Progress Notes (Signed)
After further evaluation CT head c/w CVA in setting of severe renal failure and liver disease with ETOH withdrawal After discussion with neurology and family, it seems that we need to assess other metabolic disturbances before we can truly say that his encephalopathy is from CVA  Plan for vasc cath placement and assess for CRRT/HD   Lucie LeatherKurian David Damont Balles, M.D.  [facility]  Medical Director [facility] Medical Director [facility]

## 2017-11-11 NOTE — Progress Notes (Signed)
Pharmacy Electrolyte  And CRRT Monitoring Consult:  Pharmacy consulted to assist in monitoring and replacing electrolytes in this 62 y.o. male admitted on 11/16/2017 with Allergic Reaction   Labs:  Sodium (mmol/L)  Date Value  11/11/2017 153 (H)   Potassium (mmol/L)  Date Value  11/11/2017 4.6   Magnesium (mg/dL)  Date Value  78/29/562104/18/2019 1.9   Phosphorus (mg/dL)  Date Value  30/86/578404/18/2019 7.5 (H)   Calcium (mg/dL)  Date Value  69/62/952804/18/2019 7.6 (L)   Albumin (g/dL)  Date Value  41/32/440104/18/2019 1.7 (L)    Plan: No replacement warranted at this time. Per conversation on rounds, patient initiated on D5 @ 3050mL/hr.   Cefazolin transitioned to 2g IV Q8hr. No further CRRT adjustments warranted.   Pharmacy will continue to monitor and adjust per consult.   Simpson,Michael L 11/11/2017 9:19 PM

## 2017-11-11 NOTE — Procedures (Signed)
Pre procedural Dx: Right sided hydropneumothorax Post procedural Dx: Same  Technically successful CT guided placed of a 10 Fr drainage catheter placement into the right pleural space yielding 1.8 L of purulent, foul smelling fluid.     A sample of aspirated fluid was sent to the laboratory for analysis.    EBL: None  Complications: None immediate  Katherina RightJay Emeri Estill, MD Pager #: [phone]

## 2017-11-11 NOTE — Consult Note (Signed)
Referring Physician: Kasa    Chief Complaint: Stroke  HPI: Dustin Orozco is an 62 y.o. male with a history of ETOH abuse who presented with angioedema requiring intubation.  Was extubated 4/10 but within 24 hours became increasingly agitated and delirious requiring reintubation.  Patient has remained intubated.  On sedation but not responsive.  Consult called for further recommendations.    Date last known well: Unable to determine Time last known well: Unable to determine tPA Given: No: Unable to determine LKW  Past Medical History:  Diagnosis Date  . Arthritis   . Hypertension     Past Surgical History:  Procedure Laterality Date  . INTUBATION-ENDOTRACHEAL WITH TRACHEOSTOMY STANDBY N/A 11/04/2017   Procedure: INTUBATION-ENDOTRACHEAL;  Surgeon: Beverly Gust, MD;  Location: [facility] ORS;  Service: ENT;  Laterality: N/A;  . JOINT REPLACEMENT     Family history: Patient unable to provide due to intubation and sedation  Social History:  reports that he has never smoked. He has never used smokeless tobacco. He reports that he drinks alcohol. He reports that he has current or past drug history.  Allergies:  Allergies  Allergen Reactions  . Lisinopril     Angioedema    Medications:  I have reviewed the patient's current medications. Prior to Admission:  Medications Prior to Admission  Medication Sig Dispense Refill Last Dose  . amLODipine (NORVASC) 10 MG tablet Take 10 mg by mouth daily.      Marland Kitchen ammonium lactate (LAC-HYDRIN) 12 % lotion Apply 1 application topically as needed for dry skin.     . carvedilol (COREG) 25 MG tablet Take 25 mg by mouth 2 (two) times daily with a meal.     . loratadine (CLARITIN) 10 MG tablet Take 10 mg by mouth daily.      . pantoprazole (PROTONIX) 40 MG tablet Take 40 mg by mouth 2 (two) times daily.      . potassium chloride SA (K-DUR,KLOR-CON) 20 MEQ tablet Take 20 mEq by mouth 2 (two) times daily.     . sildenafil (VIAGRA) 100 MG tablet Take 100 mg by  mouth as needed for erectile dysfunction.      . triamterene-hydrochlorothiazide (MAXZIDE-25) 37.5-25 MG tablet Take 1 tablet by mouth daily.       Scheduled: . budesonide (PULMICORT) nebulizer solution  0.25 mg Nebulization BID  . chlorhexidine gluconate (MEDLINE KIT)  15 mL Mouth Rinse BID  . feeding supplement (PRO-STAT SUGAR FREE 64)  30 mL Per Tube BID  . folic acid  1 mg Per Tube Daily  . free water  250 mL Per Tube Q4H  . heparin injection (subcutaneous)  5,000 Units Subcutaneous Q8H  . insulin aspart  0-9 Units Subcutaneous Q4H  . ipratropium-albuterol  3 mL Nebulization Q4H  . mouth rinse  15 mL Mouth Rinse 10 times per day  . metoCLOPramide (REGLAN) injection  10 mg Intravenous Once  . multivitamin  15 mL Per Tube Daily  . pantoprazole sodium  40 mg Per Tube BID  . senna-docusate  2 tablet Per Tube BID  . thiamine  100 mg Per Tube Daily    ROS: Unable to provide due to mental status  Physical Examination: Blood pressure (!) 65/43, pulse (!) 115, temperature 99.9 F (37.7 C), temperature source Oral, resp. rate 20, height _0  (1.854 m), weight 91.1 kg (200 lb 13.4 oz), SpO2 93 %.  HEENT-  Normocephalic, no lesions, without obvious abnormality.  Normal external eye and conjunctiva.  Normal TM's bilaterally.  Normal auditory canals and external ears. Normal external nose, mucus membranes and septum.  Normal pharynx. Cardiovascular- S1, S2 normal, pulses palpable throughout   Lungs- chest clear, no wheezing, rales, normal symmetric air entry Abdomen- soft, non-tender; bowel sounds normal; no masses,  no organomegaly Extremities- swelling noted in all extremities Lymph-no adenopathy palpable Musculoskeletal-no joint tenderness, deformity or swelling Skin-warm and dry, no hyperpigmentation, vitiligo, or suspicious lesions  Neurological Examination   Mental Status: Patient does not respond to verbal stimuli.  Does not respond to deep sternal rub.  Does not follow commands.   No verbalizations noted.  Cranial Nerves: II: patient does not respond confrontation bilaterally, pupils right 2 mm, left 3 mm,and unreactive bilaterally III,IV,VI: doll's response absent bilaterally.  V,VII: corneal reflex present bilaterally  VIII: patient does not respond to verbal stimuli IX,X: gag reflex reduced, XI: trapezius strength unable to test bilaterally XII: tongue strength unable to test Motor: Extremities flaccid throughout.  No spontaneous movement noted.  No purposeful movements noted. Sensory: Does not respond to noxious stimuli in any extremity. Deep Tendon Reflexes:  1+ in the upper extremities and absent in the lower extremities. Plantars: Mute bilaterally Cerebellar: Unable to perform   Laboratory Studies:  Basic Metabolic Panel: Recent Labs  Lab 11/07/17 0340 11/07/17 2108 11/08/17 0458 11/09/17 0733 11/10/17 0503 11/11/17 0430  NA 152*  --  152* 147* 153* 154*  K 3.5  --  3.4* 4.1 3.7 4.3  CL 120*  --  119* 116* 120* 120*  CO2 24  --  _0 GLUCOSE 200*  --  184* 163* 182* 191*  BUN 102*  --  97* 103* 121* 139*  CREATININE 1.86*  --  2.66* 3.63* 3.36* 3.56*  CALCIUM 8.5*  --  7.7* 7.4* 7.7* 7.5*  MG 2.2  --  1.9 1.8 1.8 2.0  PHOS 1.5* 3.8 2.8 6.1* 4.4 6.4*    Liver Function Tests: Recent Labs  Lab 11/06/17 0426 11/08/17 0458  AST  --  75*  ALT  --  75*  ALKPHOS  --  102  BILITOT  --  1.9*  PROT  --  5.5*  ALBUMIN 2.7* 2.2*   No results for input(s): LIPASE, AMYLASE in the last 168 hours. Recent Labs  Lab 11/06/17 0426 11/07/17 1343  AMMONIA 29 32    CBC: Recent Labs  Lab 11/05/17 0528 11/08/17 0458 11/09/17 0733 11/10/17 0503  WBC 9.2 17.6* 24.8* 30.7*  NEUTROABS  --  15.6* 23.1* 28.5*  HGB 12.7* 11.5* 11.9* 11.6*  HCT 36.4* 32.9* 35.1* 34.6*  MCV 92.8 95.5 96.6 94.7  PLT 155 187 219 252    Cardiac Enzymes: No results for input(s): CKTOTAL, CKMB, CKMBINDEX, TROPONINI in the last 168 hours.  BNP: Invalid  input(s): POCBNP  CBG: Recent Labs  Lab 11/10/17 2345 11/11/17 0343 11/11/17 0735 11/11/17 1128 11/11/17 1536  GLUCAP 171* 185* 176* 155* 140*    Microbiology: Results for orders placed or performed during the hospital encounter of 10/27/2017  MRSA PCR Screening     Status: None   Collection Time: 11/01/2017 11:38 PM  Result Value Ref Range Status   MRSA by PCR NEGATIVE NEGATIVE Final    Comment:        The GeneXpert MRSA Assay (FDA approved for NASAL specimens only), is one component of a comprehensive MRSA colonization surveillance program. It is not intended to diagnose MRSA infection nor to guide or monitor treatment for MRSA infections. Performed at [facility],  [place], [place] [address]   Culture, respiratory (NON-Expectorated)     Status: None   Collection Time: 11/08/17  5:14 AM  Result Value Ref Range Status   Specimen Description   Final    TRACHEAL ASPIRATE Performed at [facility], [address] [place], [place] [address]    Special Requests   Final    NONE Performed at [facility], [address] [place], [place] [address]    Gram Stain   Final    FEW WBC PRESENT, PREDOMINANTLY PMN ABUNDANT GRAM POSITIVE COCCI ABUNDANT GRAM NEGATIVE RODS Performed at [facility], [place] [address] [place], [place] [address]    Culture ABUNDANT STAPHYLOCOCCUS AUREUS  Final   Report Status 11/10/2017 FINAL  Final   Organism ID, Bacteria STAPHYLOCOCCUS AUREUS  Final      Susceptibility   Staphylococcus aureus - MIC*    CIPROFLOXACIN <=0.5 SENSITIVE Sensitive     ERYTHROMYCIN <=0.25 SENSITIVE Sensitive     GENTAMICIN <=0.5 SENSITIVE Sensitive     OXACILLIN 0.5 SENSITIVE Sensitive     TETRACYCLINE <=1 SENSITIVE Sensitive     VANCOMYCIN 1 SENSITIVE Sensitive     TRIMETH/SULFA <=10 SENSITIVE Sensitive     CLINDAMYCIN <=0.25 SENSITIVE Sensitive     RIFAMPIN <=0.5 SENSITIVE Sensitive     Inducible Clindamycin  NEGATIVE Sensitive     * ABUNDANT STAPHYLOCOCCUS AUREUS  Urine Culture     Status: Abnormal (Preliminary result)   Collection Time: 11/10/17  6:34 AM  Result Value Ref Range Status   Specimen Description   Final    URINE, RANDOM Performed at [facility], [address], [place], [place] [address]    Special Requests   Final    NONE Performed at [facility], [address] [place], [place] [address]    Culture (A)  Final    20,000 COLONIES/mL STAPHYLOCOCCUS SPECIES (COAGULASE NEGATIVE)   Report Status PENDING  Incomplete  CULTURE, BLOOD (ROUTINE X 2) w Reflex to ID Panel     Status: None (Preliminary result)   Collection Time: 11/10/17  8:15 AM  Result Value Ref Range Status   Specimen Description BLOOD BLOOD LEFT HAND  Final   Special Requests   Final    BOTTLES DRAWN AEROBIC AND ANAEROBIC Blood Culture adequate volume   Culture   Final    NO GROWTH < 24 HOURS Performed at [facility], [address], [place], [place] [address]    Report Status PENDING  Incomplete  CULTURE, BLOOD (ROUTINE X 2) w Reflex to ID Panel     Status: None (Preliminary result)   Collection Time: 11/10/17  8:25 AM  Result Value Ref Range Status   Specimen Description BLOOD BLOOD RIGHT HAND  Final   Special Requests   Final    BOTTLES DRAWN AEROBIC AND ANAEROBIC Blood Culture adequate volume   Culture   Final    NO GROWTH < 24 HOURS Performed at [facility], [address] [place], [place] [address]    Report Status PENDING  Incomplete  Culture, respiratory (NON-Expectorated)     Status: None (Preliminary result)   Collection Time: 11/10/17 11:13 AM  Result Value Ref Range Status   Specimen Description   Final    TRACHEAL ASPIRATE Performed at [facility], [address] [place], [place] [address]    Special Requests   Final    NONE Performed at [facility], [address] [place], [place] [address]    Gram Stain   Final     RARE WBC PRESENT, PREDOMINANTLY PMN RARE GRAM NEGATIVE COCCOBACILLI    Culture   Final    MODERATE STAPHYLOCOCCUS AUREUS SUSCEPTIBILITIES TO FOLLOW Performed at [facility], [address], [place], [place] [address]    Report Status PENDING  Incomplete    Coagulation Studies: No results for input(s): LABPROT, INR in the last 72 hours.  Urinalysis:  Recent Labs  Lab 11/10/17 0634  COLORURINE AMBER*  LABSPEC 1.014  PHURINE 5.0  GLUCOSEU 50*  HGBUR MODERATE*  BILIRUBINUR NEGATIVE  KETONESUR NEGATIVE  PROTEINUR 30*  NITRITE NEGATIVE  LEUKOCYTESUR NEGATIVE    Lipid Panel:    Component Value Date/Time   TRIG 299 (H) 11/10/2017 1709    HgbA1C: No results found for: HGBA1C  Urine Drug Screen:      Component Value Date/Time   LABOPIA NONE DETECTED 11/01/2017 0841   COCAINSCRNUR NONE DETECTED 11/01/2017 0841   LABBENZ POSITIVE (A) 11/01/2017 0841   AMPHETMU NONE DETECTED 11/01/2017 0841   THCU NONE DETECTED 11/01/2017 0841   LABBARB NONE DETECTED 11/01/2017 0841    Alcohol Level: No results for input(s): ETH in the last 168 hours.  Other results: EKG: normal sinus rhythm at 74 bpm.  Imaging: Dg Chest 1 View  Result Date: 11/11/2017 CLINICAL DATA:  Central line placement. EXAM: CHEST  1 VIEW COMPARISON:  Chest x-ray dated November 08, 2017. FINDINGS: Interval placement of a left internal jugular dialysis catheter with the tip in the distal SVC. Unchanged positioning of the endotracheal and enteric tubes. Worsening right-sided pleural effusion with near complete opacification of the right hemithorax. There is some aeration within the right upper lobe. No mediastinal shift. Unchanged atelectasis at the left lung base. No definite pneumothorax. No acute osseous abnormality. IMPRESSION: 1. Worsening right-sided pleural effusion with near complete opacification of the right hemithorax. Recommend chest CT for further evaluation. 2. Interval placement of a left internal  jugular dialysis catheter with the tip in the distal SVC. No definite pneumothorax. Electronically Signed   By: Titus Dubin M.D.   On: 11/11/2017 15:47   Ct Head Wo Contrast  Result Date: 11/11/2017 CLINICAL DATA:  Altered mental status. EXAM: CT HEAD WITHOUT CONTRAST TECHNIQUE: Contiguous axial images were obtained from the base of the skull through the vertex without intravenous contrast. COMPARISON:  None. FINDINGS: Brain: Hypodensity and loss of the normal gray-white matter differentiation within the left parafalcine parietal lobe, concerning for acute to subacute infarct. Additional focal hypodensity within the genu of left internal capsule. No hemorrhage, hydrocephalus, extra-axial collection or mass lesion/mass effect. Vascular: No hyperdense vessel or unexpected calcification. Skull: Normal. Negative for fracture or focal lesion. Sinuses/Orbits: Complete opacification of the left frontal, maxillary, and sphenoid sinuses, as well as the left ethmoid air cells. Partial opacification of the bilateral mastoid air cells. No acute orbital abnormality. Other: None. IMPRESSION: 1. Hypodensity within the left parafalcine parietal lobe, concerning for acute to subacute infarct. 2. 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 3. Extensive left-sided paranasal sinus disease. These results were called by telephone at the time of interpretation on 11/11/2017 at 9:52 am to Dr. Flora Lipps , who verbally acknowledged these results. Electronically Signed   By: Titus Dubin M.D.   On: 11/11/2017 09:53   Ct Chest Wo Contrast  Result Date: 11/11/2017 CLINICAL DATA:  62 year old male with opacified RIGHT hemithorax and respiratory failure. EXAM: CT CHEST  WITHOUT CONTRAST TECHNIQUE: Multidetector CT imaging of the chest was performed following the standard protocol without IV contrast. COMPARISON:  11/11/2017 and prior radiographs FINDINGS: Cardiovascular: Mild cardiomegaly and  coronary artery atherosclerotic calcifications noted. Aortic atherosclerotic calcifications identified without aneurysm. No pericardial effusion. Mediastinum/Nodes: Shotty mediastinal lymph nodes are present. An endotracheal tube is present with tip approximately 4 cm above the carina. A LEFT IJ central venous catheter is noted with tip in the mid SVC. An NG tube is noted with tip in the mid stomach. No mediastinal mass identified. Lungs/Pleura: A large possibly loculated RIGHT hydropneumothorax noted. Atelectasis of the majority of the RIGHT lung noted. Airspace disease within the lingula noted. Atelectasis/consolidation of the LEFT LOWER lobe noted. Upper Abdomen: No acute abnormality. Musculoskeletal: No acute or suspicious bony abnormalities. IMPRESSION: 1. Large possibly loculated RIGHT hydropneumothorax which may be related to infection or possibly malignancy. Associated compressive atelectasis of the majority of the RIGHT lung. Clinical team is aware of this per note. 2. Lingular airspace disease likely representing aspiration or pneumonia. LEFT LOWER lobe atelectasis/consolidation. 3. Support apparatus as described 4. Mild cardiomegaly and coronary artery disease 5.  Aortic Atherosclerosis (ICD10-I70.0). Electronically Signed   By: Margarette Canada M.D.   On: 11/11/2017 17:28    Assessment: 62 y.o. male presenting with angioedema.  Within a few days of admission developed agitation and confusion likely related to ETOH withdrawal.  Patient not improving significantly though so head CT performed and reviewed.  Head CT shows hypodensities in the left parietal lobe and internal capsule.  Concern is for infarct.  Although these are both present do not think they are the biggest contributor to the patient's mental status.  Patient also with liver disease, significant renal insufficiency, hypernatremia, hyperphosphatemia and pulmonary abnormalities.  Mental status likely multifactorial.  Have spoken with family at  length.  Unclear if patient would have wanted trach and PEG.  Will consider improving mental status by starting to address renal issues and based on response determine how aggressive we should further be.    Stroke Risk Factors - hypertension  Plan: 1. MRI of the brain without contrast.  May hold off until patient more stable.   2. Renal consult 3. Telemetry monitoring 4. Frequent neuro checks 5. Will continue to follow with you   Alexis Goodell, MD Neurology [phone] 11/11/2017, 6:35 PM

## 2017-11-11 NOTE — Progress Notes (Signed)
Patient ID: Dustin Orozco, male   DOB: 01/06/1956, 62 y.o.   MRN: [id number]  [facility] PROGRESS NOTE  Dustin Orozco YIR:[id number] DOB: 11/13/1955 DOA: 11/18/2017 PCP: [facility] [place]  HPI/Subjective: Patient intubated and sedated.  I was unable to get any response from him today.  Objective: Vitals:   11/11/17 1030 11/11/17 1105  BP: 103/70   Pulse: 80   Resp: 13   Temp:    SpO2: 95% 93%    Filed Weights   11/09/17 0416 11/10/17 0320 11/11/17 0407  Weight: 90.8 kg (200 lb 2.8 oz) 90.9 kg (200 lb 6.4 oz) 91.1 kg (200 lb 13.4 oz)    ROS: Review of Systems  Unable to perform ROS: Acuity of condition   Exam: Physical Exam  HENT:  Nose: No mucosal edema.  Unable to look into mouth  Eyes: Pupils are equal, round, and reactive to light. Conjunctivae are normal.  Neck: No JVD present. Carotid bruit is not present. No edema present. No thyroid mass and no thyromegaly present.  Cardiovascular: S1 normal and S2 normal. An irregularly irregular rhythm present. Exam reveals no gallop.  No murmur heard. Pulses:      Dorsalis pedis pulses are 2+ on the right side, and 2+ on the left side.  Respiratory: No respiratory distress. He has decreased breath sounds in the right middle field, the right lower field, the left middle field and the left lower field. He has wheezes in the right middle field, the right lower field, the left middle field and the left lower field. He has no rhonchi. He has no rales.  GI: Soft. Bowel sounds are normal. There is no tenderness.  Musculoskeletal:       Right ankle: He exhibits swelling.       Left ankle: He exhibits swelling.  Lymphadenopathy:    He has no cervical adenopathy.  Neurological:  Intubated and sedated unable to get any response with sternal rub.  Skin: Skin is warm. No rash noted. Nails show no clubbing.  Psychiatric:  Intubated and sedated      Data Reviewed: Basic Metabolic Panel: Recent Labs  Lab 11/07/17 0340  11/07/17 2108 11/08/17 0458 11/09/17 0733 11/10/17 0503 11/11/17 0430  NA 152*  --  152* 147* 153* 154*  K 3.5  --  3.4* 4.1 3.7 4.3  CL 120*  --  119* 116* 120* 120*  CO2 24  --  23 23 23 25   GLUCOSE 200*  --  184* 163* 182* 191*  BUN 102*  --  97* 103* 121* 139*  CREATININE 1.86*  --  2.66* 3.63* 3.36* 3.56*  CALCIUM 8.5*  --  7.7* 7.4* 7.7* 7.5*  MG 2.2  --  1.9 1.8 1.8 2.0  PHOS 1.5* 3.8 2.8 6.1* 4.4 6.4*   Liver Function Tests: Recent Labs  Lab 11/06/17 0426 11/08/17 0458  AST  --  75*  ALT  --  75*  ALKPHOS  --  102  BILITOT  --  1.9*  PROT  --  5.5*  ALBUMIN 2.7* 2.2*    Recent Labs  Lab 11/06/17 0426 11/07/17 1343  AMMONIA 29 32   CBC: Recent Labs  Lab 11/05/17 0528 11/08/17 0458 11/09/17 0733 11/10/17 0503  WBC 9.2 17.6* 24.8* 30.7*  NEUTROABS  --  15.6* 23.1* 28.5*  HGB 12.7* 11.5* 11.9* 11.6*  HCT 36.4* 32.9* 35.1* 34.6*  MCV 92.8 95.5 96.6 94.7  PLT 155 187 219 252   4CBG: Recent Labs  Lab  11/10/17 1611 11/10/17 1938 11/10/17 2345 11/11/17 0343 11/11/17 0735  GLUCAP 188* 164* 171* 185* 176*    Recent Results (from the past 240 hour(s))  Culture, respiratory (NON-Expectorated)     Status: None   Collection Time: 11/08/17  5:14 AM  Result Value Ref Range Status   Specimen Description   Final    TRACHEAL ASPIRATE Performed at [facility], [address], [place], [place] [address]    Special Requests   Final    NONE Performed at [facility], [place] [place], [place] [address]    Gram Stain   Final    FEW WBC PRESENT, PREDOMINANTLY PMN ABUNDANT GRAM POSITIVE COCCI ABUNDANT GRAM NEGATIVE RODS Performed at [facility], [place] [address], [place], [place] [address]    Culture ABUNDANT STAPHYLOCOCCUS AUREUS  Final   Report Status 11/10/2017 FINAL  Final   Organism ID, Bacteria STAPHYLOCOCCUS AUREUS  Final      Susceptibility   Staphylococcus aureus - MIC*    CIPROFLOXACIN <=0.5 SENSITIVE  Sensitive     ERYTHROMYCIN <=0.25 SENSITIVE Sensitive     GENTAMICIN <=0.5 SENSITIVE Sensitive     OXACILLIN 0.5 SENSITIVE Sensitive     TETRACYCLINE <=1 SENSITIVE Sensitive     VANCOMYCIN 1 SENSITIVE Sensitive     TRIMETH/SULFA <=10 SENSITIVE Sensitive     CLINDAMYCIN <=0.25 SENSITIVE Sensitive     RIFAMPIN <=0.5 SENSITIVE Sensitive     Inducible Clindamycin NEGATIVE Sensitive     * ABUNDANT STAPHYLOCOCCUS AUREUS  Urine Culture     Status: Abnormal (Preliminary result)   Collection Time: 11/10/17  6:34 AM  Result Value Ref Range Status   Specimen Description   Final    URINE, RANDOM Performed at [facility], [address], [place], [place] [address]    Special Requests   Final    NONE Performed at [facility], [address] [place], [place] [address]    Culture (A)  Final    20,000 COLONIES/mL UNIDENTIFIED ORGANISM Performed at [facility], [place] [address] [place], [place] [address]    Report Status PENDING  Incomplete  CULTURE, BLOOD (ROUTINE X 2) w Reflex to ID Panel     Status: None (Preliminary result)   Collection Time: 11/10/17  8:15 AM  Result Value Ref Range Status   Specimen Description BLOOD BLOOD LEFT HAND  Final   Special Requests   Final    BOTTLES DRAWN AEROBIC AND ANAEROBIC Blood Culture adequate volume   Culture   Final    NO GROWTH < 24 HOURS Performed at [facility], [address] [place], [place] [address]    Report Status PENDING  Incomplete  CULTURE, BLOOD (ROUTINE X 2) w Reflex to ID Panel     Status: None (Preliminary result)   Collection Time: 11/10/17  8:25 AM  Result Value Ref Range Status   Specimen Description BLOOD BLOOD RIGHT HAND  Final   Special Requests   Final    BOTTLES DRAWN AEROBIC AND ANAEROBIC Blood Culture adequate volume   Culture   Final    NO GROWTH < 24 HOURS Performed at [facility], [address] [place], [place] [address]    Report Status PENDING  Incomplete   Culture, respiratory (NON-Expectorated)     Status: None (Preliminary result)   Collection Time: 11/10/17 11:13 AM  Result Value Ref Range Status   Specimen Description   Final    TRACHEAL ASPIRATE  Performed at [facility], [address] [place], [place] [address]    Special Requests   Final    NONE Performed at [facility], [place], [place] [address]    Gram Stain   Final    RARE WBC PRESENT, PREDOMINANTLY PMN RARE GRAM NEGATIVE COCCOBACILLI    Culture   Final    MODERATE STAPHYLOCOCCUS AUREUS SUSCEPTIBILITIES TO FOLLOW Performed at [facility], [place] [address] [place], [place] [address]    Report Status PENDING  Incomplete     Studies: Ct Head Wo Contrast  Result Date: 11/11/2017 CLINICAL DATA:  Altered mental status. EXAM: CT HEAD WITHOUT CONTRAST TECHNIQUE: Contiguous axial images were obtained from the base of the skull through the vertex without intravenous contrast. COMPARISON:  None. FINDINGS: Brain: Hypodensity and loss of the normal gray-white matter differentiation within the left parafalcine parietal lobe, concerning for acute to subacute infarct. Additional focal hypodensity within the genu of left internal capsule. No hemorrhage, hydrocephalus, extra-axial collection or mass lesion/mass effect. Vascular: No hyperdense vessel or unexpected calcification. Skull: Normal. Negative for fracture or focal lesion. Sinuses/Orbits: Complete opacification of the left frontal, maxillary, and sphenoid sinuses, as well as the left ethmoid air cells. Partial opacification of the bilateral mastoid air cells. No acute orbital abnormality. Other: None. IMPRESSION: 1. Hypodensity within the left parafalcine parietal lobe, concerning for acute to subacute infarct. 2. 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 3. Extensive left-sided paranasal sinus disease. These results were called by telephone at the time of  interpretation on 11/11/2017 at 9:52 am to Dr. Flora Lipps , who verbally acknowledged these results. Electronically Signed   By: Titus Dubin M.D.   On: 11/11/2017 09:53    Scheduled Meds: . budesonide (PULMICORT) nebulizer solution  0.25 mg Nebulization BID  . chlorhexidine gluconate (MEDLINE KIT)  15 mL Mouth Rinse BID  . feeding supplement (PRO-STAT SUGAR FREE 64)  30 mL Per Tube BID  . folic acid  1 mg Per Tube Daily  . free water  250 mL Per Tube Q4H  . heparin injection (subcutaneous)  5,000 Units Subcutaneous Q8H  . insulin aspart  0-9 Units Subcutaneous Q4H  . ipratropium-albuterol  3 mL Nebulization Q4H  . mouth rinse  15 mL Mouth Rinse 10 times per day  . metoCLOPramide (REGLAN) injection  10 mg Intravenous Once  . multivitamin  15 mL Per Tube Daily  . pantoprazole sodium  40 mg Per Tube BID  . senna-docusate  2 tablet Per Tube BID  . thiamine  100 mg Per Tube Daily   Continuous Infusions: . amiodarone 60 mg/hr (11/11/17 0831)  .  ceFAZolin (ANCEF) IV Stopped (11/11/17 0014)  . dextrose    . feeding supplement (VITAL 1.5 CAL) 50 mL/hr at 11/11/17 0600  . fentaNYL infusion INTRAVENOUS 250 mcg/hr (11/11/17 0900)  . norepinephrine (LEVOPHED) Adult infusion 2 mcg/min (11/11/17 1045)    Assessment/Plan:  1. Delirium tremens and alcohol withdrawal.  Will have to go through withdrawal on the ventilator. 2. Septic shock with Mssa pneumonia on cefazolin.  Pro-calcitonin elevated.  Vasopressors to keep blood pressure greater than 65.  Critical care team considering steroids. 3. SVT, atrial fibrillation, nonsustained ventricular tachycardia.  Patient started on amiodarone drip. 4. Acute to subacute stroke with acute encephalopathy and not waking up once off sedation.  Critical care team waiting  to speak with the family about whether trach and PEG needed prior to starting  any anticoagulation. 5. Acute hypoxic respiratory failure.  patient on 70% FiO2.  Worsening respiratory status  overnight.  6. Angioedema secondary to lisinopril.  This has improved with supportive care. 7. Acute kidney injury.  Creatinine peaked at 5.33 and came down to 1.86 and then worsened again and up at 3.56. 8. Hypernatremia on free water 9. NG tube feeding 10. Hypomagnesemia replaced  Code Status:     Code Status Orders  (From admission, onward)        Start     Ordered   11/23/2017 2322  Full code  Continuous     11/11/2017 2321    Code Status History    This patient has a current code status but no historical code status.     Family Communication: As per critical care specialist  disposition Plan: To be determined  Consultants:  Critical care team  Time spent: 25 minutes Case discussed with critical care specialist.  [facility]

## 2017-11-11 NOTE — Procedures (Signed)
Central Venous Dailysis Catheter Placement: DOUBLE LUMEN  Indication: Hemo Dialysis/CRRT   Consent:emergent   Hand washing performed prior to starting the procedure.   Procedure: An active timeout was performed and correct patient, name, & ID confirmed. Patient was positioned correctly for central venous access. Patient was prepped using strict sterile technique including chlorohexadine preps, sterile drape, sterile gown and sterile gloves.  The area was prepped, draped and anesthetized in the usual sterile manner. Patient comfort was obtained.    A Double lumen catheter was placed in LEFT IJ Vein There was good blood return, catheter caps were placed on lumens, catheter flushed easily, the line was secured and a sterile dressing and BIO-PATCH applied.   Ultrasound was used to visualize vasculature and guidance of needle.   Number of Attempts: 1 Complications:none  Estimated Blood Loss: none Operator: Mussa Groesbeck.   Jarrod Mcenery David Marceline Napierala, M.D.  [facility]  Medical Director ICU-[facility] [place] Medical Director [facility] Cardio-Pulmonary Department     

## 2017-11-11 NOTE — [place] Notes (Signed)
Came to see patient, however he's down for chest tube at the moment.  Case discussed in depth with Dr. Belia HemanKasa.  We will proceed with CRRT at this time.  Temporary dialysis catheter has been placed.  K normal at 4.3, therefore will start on 2K bath.  Formal consult in AM.

## 2017-11-11 NOTE — Consult Note (Signed)
Pharmacy consulted for post IR anticoagulant orders. Pt already with subq heparin orders. Standard procedure. Will bump next dose out 6 hours from procedure and then resume standard dosing of subq heparin  Sharnae Winfree D Miya Luviano, Pharm.D, BCPS Clinical Pharmacist

## 2017-11-12 ENCOUNTER — Inpatient Hospital Stay: Payer: Medicare Other

## 2017-11-12 DIAGNOSIS — J9600 Acute respiratory failure, unspecified whether with hypoxia or hypercapnia: Secondary | ICD-10-CM

## 2017-11-12 DIAGNOSIS — G9340 Encephalopathy, unspecified: Secondary | ICD-10-CM

## 2017-11-12 DIAGNOSIS — J869 Pyothorax without fistula: Secondary | ICD-10-CM

## 2017-11-12 LAB — CBC
HEMATOCRIT: 30.3 % — AB (ref 40.0–52.0)
HEMOGLOBIN: 10.2 g/dL — AB (ref 13.0–18.0)
MCH: 32.4 pg (ref 26.0–34.0)
MCHC: 33.6 g/dL (ref 32.0–36.0)
MCV: 96.3 fL (ref 80.0–100.0)
Platelets: 182 10*3/uL (ref 150–440)
RBC: 3.14 MIL/uL — AB (ref 4.40–5.90)
RDW: 16.4 % — ABNORMAL HIGH (ref 11.5–14.5)
WBC: 14.1 10*3/uL — ABNORMAL HIGH (ref 3.8–10.6)

## 2017-11-12 LAB — BLOOD GAS, ARTERIAL
ACID-BASE DEFICIT: 3.7 mmol/L — AB (ref 0.0–2.0)
BICARBONATE: 27.3 mmol/L (ref 20.0–28.0)
FIO2: 0.75
MECHVT: 500 mL
Mechanical Rate: 16
O2 Saturation: 95.1 %
PATIENT TEMPERATURE: 37
PEEP: 8 cmH2O
PH ART: 7.12 — AB (ref 7.350–7.450)
PO2 ART: 99 mmHg (ref 83.0–108.0)
pCO2 arterial: 84 mmHg (ref 32.0–48.0)

## 2017-11-12 LAB — RENAL FUNCTION PANEL
ALBUMIN: 1.2 g/dL — AB (ref 3.5–5.0)
ALBUMIN: 1.4 g/dL — AB (ref 3.5–5.0)
ANION GAP: 10 (ref 5–15)
ANION GAP: 9 (ref 5–15)
Albumin: 1.4 g/dL — ABNORMAL LOW (ref 3.5–5.0)
Albumin: 2.2 g/dL — ABNORMAL LOW (ref 3.5–5.0)
Albumin: 1.4 g/dL — ABNORMAL LOW (ref 3.5–5.0)
Anion gap: 9 (ref 5–15)
Anion gap: 6 (ref 5–15)
Anion gap: 8 (ref 5–15)
BUN: 104 mg/dL — ABNORMAL HIGH (ref 6–20)
BUN: 90 mg/dL — AB (ref 6–20)
BUN: 16 mg/dL (ref 6–20)
BUN: 112 mg/dL — ABNORMAL HIGH (ref 6–20)
BUN: 103 mg/dL — ABNORMAL HIGH (ref 6–20)
CALCIUM: 6.3 mg/dL — AB (ref 8.9–10.3)
CALCIUM: 7.3 mg/dL — AB (ref 8.9–10.3)
CHLORIDE: 107 mmol/L (ref 101–111)
CHLORIDE: 107 mmol/L (ref 101–111)
CO2: 25 mmol/L (ref 22–32)
CO2: 23 mmol/L (ref 22–32)
CO2: 27 mmol/L (ref 22–32)
CO2: 24 mmol/L (ref 22–32)
CO2: 24 mmol/L (ref 22–32)
CREATININE: 1.59 mg/dL — AB (ref 0.61–1.24)
Calcium: 7.2 mg/dL — ABNORMAL LOW (ref 8.9–10.3)
Calcium: 10.5 mg/dL — ABNORMAL HIGH (ref 8.9–10.3)
Calcium: 7.3 mg/dL — ABNORMAL LOW (ref 8.9–10.3)
Chloride: 113 mmol/L — ABNORMAL HIGH (ref 101–111)
Chloride: 115 mmol/L — ABNORMAL HIGH (ref 101–111)
Chloride: 98 mmol/L — ABNORMAL LOW (ref 101–111)
Creatinine, Ser: 3.13 mg/dL — ABNORMAL HIGH (ref 0.61–1.24)
Creatinine, Ser: 2.48 mg/dL — ABNORMAL HIGH (ref 0.61–1.24)
Creatinine, Ser: 3.63 mg/dL — ABNORMAL HIGH (ref 0.61–1.24)
Creatinine, Ser: 3.31 mg/dL — ABNORMAL HIGH (ref 0.61–1.24)
GFR calc Af Amer: 23 mL/min — ABNORMAL LOW
GFR calc Af Amer: 30 mL/min — ABNORMAL LOW (ref >60)
GFR calc Af Amer: 19 mL/min — ABNORMAL LOW (ref >60)
GFR calc non Af Amer: 20 mL/min — ABNORMAL LOW
GFR calc non Af Amer: 26 mL/min — ABNORMAL LOW (ref >60)
GFR calc non Af Amer: 45 mL/min — ABNORMAL LOW (ref >60)
GFR calc non Af Amer: 16 mL/min — ABNORMAL LOW (ref >60)
GFR, EST AFRICAN AMERICAN: 52 mL/min — AB (ref >60)
GFR, EST AFRICAN AMERICAN: 21 mL/min — AB (ref >60)
GFR, EST NON AFRICAN AMERICAN: 18 mL/min — AB (ref >60)
GLUCOSE: 128 mg/dL — AB (ref 65–99)
Glucose, Bld: 142 mg/dL — ABNORMAL HIGH (ref 65–99)
Glucose, Bld: 193 mg/dL — ABNORMAL HIGH (ref 65–99)
Glucose, Bld: 192 mg/dL — ABNORMAL HIGH (ref 65–99)
Glucose, Bld: 185 mg/dL — ABNORMAL HIGH (ref 65–99)
PHOSPHORUS: 5 mg/dL — AB (ref 2.5–4.6)
PHOSPHORUS: 4.5 mg/dL (ref 2.5–4.6)
POTASSIUM: 3.5 mmol/L (ref 3.5–5.1)
POTASSIUM: 3.8 mmol/L (ref 3.5–5.1)
Phosphorus: 6.1 mg/dL — ABNORMAL HIGH (ref 2.5–4.6)
Phosphorus: 3.1 mg/dL (ref 2.5–4.6)
Phosphorus: 5.8 mg/dL — ABNORMAL HIGH (ref 2.5–4.6)
Potassium: 4 mmol/L (ref 3.5–5.1)
Potassium: 3.8 mmol/L (ref 3.5–5.1)
Potassium: 4.1 mmol/L (ref 3.5–5.1)
SODIUM: 144 mmol/L (ref 135–145)
SODIUM: 133 mmol/L — AB (ref 135–145)
SODIUM: 141 mmol/L (ref 135–145)
Sodium: 147 mmol/L — ABNORMAL HIGH (ref 135–145)
Sodium: 140 mmol/L (ref 135–145)

## 2017-11-12 LAB — URINE CULTURE: Culture: 20000 — AB

## 2017-11-12 LAB — GLUCOSE, CAPILLARY
GLUCOSE-CAPILLARY: 118 mg/dL — AB (ref 65–99)
GLUCOSE-CAPILLARY: 125 mg/dL — AB (ref 65–99)
GLUCOSE-CAPILLARY: 146 mg/dL — AB (ref 65–99)
GLUCOSE-CAPILLARY: 160 mg/dL — AB (ref 65–99)
GLUCOSE-CAPILLARY: 179 mg/dL — AB (ref 65–99)
Glucose-Capillary: 107 mg/dL — ABNORMAL HIGH (ref 65–99)

## 2017-11-12 LAB — MAGNESIUM
MAGNESIUM: 1.9 mg/dL (ref 1.7–2.4)
MAGNESIUM: 1.8 mg/dL (ref 1.7–2.4)
Magnesium: 1.7 mg/dL (ref 1.7–2.4)
Magnesium: 1.3 mg/dL — ABNORMAL LOW (ref 1.7–2.4)
Magnesium: 1.8 mg/dL (ref 1.7–2.4)

## 2017-11-12 LAB — CULTURE, RESPIRATORY W GRAM STAIN

## 2017-11-12 LAB — CULTURE, RESPIRATORY

## 2017-11-12 MED ORDER — STERILE WATER FOR INJECTION IJ SOLN
INTRAMUSCULAR | Status: AC
  Administered 2017-11-12: 14:00:00
  Filled 2017-11-12: qty 10

## 2017-11-12 MED ORDER — ALTEPLASE 2 MG IJ SOLR
4.0000 mg | Freq: Once | INTRAMUSCULAR | Status: AC
  Administered 2017-11-12: 4 mg
  Filled 2017-11-12: qty 4

## 2017-11-12 MED ORDER — MIDAZOLAM BOLUS VIA INFUSION
2.0000 mg | Freq: Once | INTRAVENOUS | Status: AC
  Administered 2017-11-12: 2 mg via INTRAVENOUS

## 2017-11-12 MED ORDER — MAGNESIUM SULFATE 2 GM/50ML IV SOLN
2.0000 g | Freq: Once | INTRAVENOUS | Status: AC
  Administered 2017-11-12: 2 g via INTRAVENOUS
  Filled 2017-11-12: qty 50

## 2017-11-12 MED ORDER — MIDAZOLAM HCL 2 MG/2ML IJ SOLN
2.0000 mg | Freq: Once | INTRAMUSCULAR | Status: AC
  Administered 2017-11-12: 2 mg via INTRAVENOUS

## 2017-11-12 MED ORDER — SODIUM CHLORIDE 0.9 % IV SOLN
Freq: Two times a day (BID) | INTRAVENOUS | Status: DC
  Administered 2017-11-12 – 2017-11-13 (×3): via INTRAVENOUS
  Filled 2017-11-12 (×4): qty 7.5

## 2017-11-12 MED ORDER — VITAL 1.5 CAL PO LIQD
1000.0000 mL | ORAL | Status: DC
  Administered 2017-11-14: 1000 mL

## 2017-11-12 MED ORDER — LEVETIRACETAM IN NACL 1000 MG/100ML IV SOLN: 1000.0000 mg | Freq: Two times a day (BID) | INTRAVENOUS | Status: DC

## 2017-11-12 MED ORDER — SODIUM CHLORIDE 0.9 % IV SOLN
1.0000 mg/h | INTRAVENOUS | Status: DC
  Administered 2017-11-12 – 2017-11-13 (×4): 5 mg/h via INTRAVENOUS
  Administered 2017-11-14: 8 mg/h via INTRAVENOUS
  Administered 2017-11-14: 10 mg/h via INTRAVENOUS
  Administered 2017-11-14 – 2017-11-15 (×2): 8 mg/h via INTRAVENOUS
  Filled 2017-11-12 (×11): qty 10

## 2017-11-12 MED ORDER — MIDAZOLAM HCL 2 MG/2ML IJ SOLN
INTRAMUSCULAR | Status: AC
  Administered 2017-11-12: 2 mg via INTRAVENOUS
  Filled 2017-11-12: qty 2

## 2017-11-12 MED ORDER — LORAZEPAM 2 MG/ML IJ SOLN
2.0000 mg | INTRAMUSCULAR | Status: DC | PRN
  Administered 2017-11-12 – 2017-11-16 (×8): 2 mg via INTRAVENOUS
  Filled 2017-11-12 (×8): qty 1

## 2017-11-12 MED ORDER — SODIUM CHLORIDE 0.9 % IV SOLN
Freq: Two times a day (BID) | INTRAVENOUS | Status: DC
  Administered 2017-11-12: 16:00:00 via INTRAVENOUS
  Filled 2017-11-12 (×3): qty 10

## 2017-11-12 MED ORDER — SODIUM CHLORIDE 0.9 % IV SOLN
0.0000 ug/min | INTRAVENOUS | Status: DC
  Administered 2017-11-12: 75 ug/min via INTRAVENOUS
  Administered 2017-11-14: 100 ug/min via INTRAVENOUS
  Administered 2017-11-14: 180 ug/min via INTRAVENOUS
  Administered 2017-11-15: 210 ug/min via INTRAVENOUS
  Administered 2017-11-15: 200 ug/min via INTRAVENOUS
  Filled 2017-11-12 (×2): qty 4
  Filled 2017-11-12 (×2): qty 40
  Filled 2017-11-12: qty 4
  Filled 2017-11-12 (×2): qty 40
  Filled 2017-11-12: qty 4
  Filled 2017-11-12: qty 40

## 2017-11-12 MED ORDER — ALTEPLASE 2 MG IJ SOLR
2.0000 mg | Freq: Once | INTRAMUSCULAR | Status: AC
  Administered 2017-11-12: 2 mg
  Filled 2017-11-12: qty 2

## 2017-11-12 MED ORDER — STERILE WATER FOR INJECTION IJ SOLN
INTRAMUSCULAR | Status: AC
  Administered 2017-11-12: 10 mL
  Filled 2017-11-12: qty 10

## 2017-11-12 MED ORDER — PHENYLEPHRINE HCL 10 MG/ML IJ SOLN: 0.1000 ug/kg/min | INTRAVENOUS | Status: DC

## 2017-11-12 MED ORDER — VASOPRESSIN 20 UNIT/ML IV SOLN
0.0400 [IU]/min | INTRAVENOUS | Status: DC
  Administered 2017-11-12: 0.04 [IU]/min via INTRAVENOUS
  Filled 2017-11-12 (×2): qty 2

## 2017-11-12 MED ORDER — PRO-STAT SUGAR FREE PO LIQD
30.0000 mL | Freq: Three times a day (TID) | ORAL | Status: DC
  Administered 2017-11-12 – 2017-11-16 (×10): 30 mL

## 2017-11-12 NOTE — [place] Notes (Addendum)
Pharmacy Electrolyte  And CRRT Monitoring Consult:  Pharmacy consulted to assist in monitoring and replacing electrolytes in this 62 y.o. male admitted on 11/18/2017 with Allergic Reaction  Patient currently on CRRT. Patient with consistent CRRT interruptions throughout shift. Dialysis catheter has been replaced.   Labs:  Sodium (mmol/L)  Date Value  11/12/2017 141   Potassium (mmol/L)  Date Value  11/12/2017 4.1   Magnesium (mg/dL)  Date Value  40/98/119104/19/2019 1.9   Phosphorus (mg/dL)  Date Value  47/82/956204/19/2019 5.8 (H)   Calcium (mg/dL)  Date Value  13/08/657804/19/2019 7.3 (L)   Albumin (g/dL)  Date Value  46/96/295204/19/2019 1.4 (L)    Plan: No replacement warranted at this time. Patient received magnesium 2g IV x 1. Pharmacy should defer replacement until speaking with nephrology.   Cefazolin transitioned to 2g IV Q8hr. No further CRRT adjustments warranted. Will transition levetiracetam to 750mg  IV Q12hr.   Pharmacy will continue to monitor and adjust per consult.   Regene Mccarthy L 11/12/2017 7:54 PM

## 2017-11-12 NOTE — [place] Notes (Signed)
Nutrition Follow-up  DOCUMENTATION CODES:   Not applicable  INTERVENTION:   Begin Vital 1.5 at 62m/hr (13228mgoal daily volume), Pro-stat 3035mID via NGT  Provides 2280 calories, 134 grams protein, 1008m3mee water  Continue liquid MVI daily per tube, folic acid 1 mg daily per tube, thiamine 100 mg daily per tube.  With D5 at 50mL81mprovides total 2484 calories  D/C Free water in setting of CRRT, Hyopnatremia  NUTRITION DIAGNOSIS:   Inadequate oral intake related to inability to eat as evidenced by NPO status. -ongoing - addressing with TFs  GOAL:   Provide needs based on ASPEN/SCCM guidelines -will be met TFs  MONITOR:   Vent status, Labs, Weight trends, TF tolerance, I & O's  ASSESSMENT:   61 ye44 old male with PMHx of HTN, arthritis who is admitted with acute angioedema secondary to lisinopril s/p intubation on 4/7 by ENT.  04/08 developed anuric renal failure 04/08 Renal US: nKoreahydro, bladder decompressed 04/09 Renal consultation: "Acute renal failure is likely secondary to severe ATN likely from hypotension.Renal ultrasound is negative for obstruction" 04/10 Extubated. Tolerated well but requiring [place] O2 @ 6 LPM 04/11 tolerating extubation well.  However, increasing agitated delirium.  Wife reports heavy chronic alcohol use.  Lorazepam initiated.  Persistent agitation.  Dexmedetomidine infusion initiated. 04/12 BIPAP initiated las night 04/14 Intubated 4/15 remains intubated 4/17 remains delerious and intubated 4/18 +CVA +hydroPTX s/p chest tube, started CRRT  Discussed in rounds. Was started on CRRT yesterday evening after developing AKI. Dialysis catheter clotted early this morning but has now been restarted. CT head positive for CVA CT Chest positive for hydropneumothorax, lung abscess/empyema Family at bedside. Ongoing GOC discussions taking place.  On multiple pressors but rate is being titrated down currently - CCM ok to restart  TFs.   Intake/Output Summary (Last 24 hours) at 11/12/2017 1434 Last data filed at 11/12/2017 1224 Gross per 24 hour  Intake 3876.33 ml  Output 1092 ml  Net 2784.33 ml  620mL 33mlast 24 hrs 10.6L Fluid Positive  Access: NGT placed 4/13; terminates in proximal stomach per abdominal x-ray 4/13; 71 cm at right nare  Patient is currently intubated on ventilator support MV: 11.9 L/min Temp (24hrs), Avg:99.2 F (37.3 C), Min:97.8 F (36.6 C), Max:100.8 F (38.2 C) Propofol none   Labs reviewed:  CBGs 118, 125, 107 Na 133  Medications reviewed and include:  Senokot-S Amiodarone gtt Levo gtt, Neo gtt, Vaso gtt   Diet Order:  No diet orders on file  EDUCATION NEEDS:   No education needs have been identified at this time  Skin:  Skin Assessment: Reviewed RN Assessment  Last BM:  11/08/2017  Height:   Ht Readings from Last 1 Encounters:  11/14/2017 _0  (1.854 m)    Weight:   Wt Readings from Last 1 Encounters:  11/12/17 212 lb 11.9 oz (96.5 kg)    Ideal Body Weight:  83.6 kg  BMI:  Body mass index is 28.07 kg/m.  Estimated Nutritional Needs:   Kcal:  2257 ([id number] w/ MSJ 1721, VE 11.9, Tmax 38.2)  Protein:  130-217 (1.5-2.5g/kg)  Fluid:  UOP +500mL  1mliamSatira Anis MS, RD LDN Inpatient Clinical Dietitian Pager [phone]

## 2017-11-12 NOTE — Progress Notes (Signed)
Patient ID: Dustin Orozco, male   DOB: 1955/08/10, 62 y.o.   MRN: [id number]  [facility] PROGRESS NOTE  Dustin Orozco QVZ:[id number] DOB: August 08, 1955 DOA: 11/03/2017 PCP: [facility] [place]  HPI/Subjective: Remains intubated and sedated  Objective: Vitals:   11/12/17 1400 11/12/17 1445  BP: 125/77 95/62  Pulse:  67  Resp: (!) 24 (!) 22  Temp:    SpO2:  100%    Filed Weights   11/10/17 0320 11/11/17 0407 11/12/17 0500  Weight: 90.9 kg (200 lb 6.4 oz) 91.1 kg (200 lb 13.4 oz) 96.5 kg (212 lb 11.9 oz)    ROS: Review of Systems  Unable to perform ROS: Acuity of condition   Exam: Physical Exam  HENT:  Nose: No mucosal edema.  Unable to look into mouth  Eyes: Pupils are equal, round, and reactive to light. Conjunctivae are normal.  Neck: No JVD present. Carotid bruit is not present. No edema present. No thyroid mass and no thyromegaly present.  Cardiovascular: S1 normal and S2 normal. An irregularly irregular rhythm present. Exam reveals no gallop.  No murmur heard. Pulses:      Dorsalis pedis pulses are 2+ on the right side, and 2+ on the left side.  Respiratory: No respiratory distress. He has decreased breath sounds in the right middle field, the right lower field, the left middle field and the left lower field. He has wheezes in the right middle field, the right lower field, the left middle field and the left lower field. He has no rhonchi. He has no rales.  GI: Soft. Bowel sounds are normal. There is no tenderness.  Musculoskeletal:       Right ankle: He exhibits swelling.       Left ankle: He exhibits swelling.  Lymphadenopathy:    He has no cervical adenopathy.  Neurological:  Intubated and sedated unable to get any response with sternal rub.  Skin: Skin is warm. No rash noted. Nails show no clubbing.  Psychiatric:  Intubated and sedated      Data Reviewed: Basic Metabolic Panel: Recent Labs  Lab 11/11/17 1829 11/11/17 2127 11/12/17 0159  11/12/17 0556 11/12/17 0949  NA 153* 152* 147* 144 133*  K 4.6 4.9 4.0 3.5 3.8  CL 117* 116* 113* 115* 98*  CO2 _0 GLUCOSE 164* 172* 142* 128* 193*  BUN 137* 129* 104* 90* 16  CREATININE 4.15* 4.40* 3.13* 2.48* 1.59*  CALCIUM 7.6* 7.6* 7.2* 6.3* 10.5*  MG 1.9 1.9 1.7 1.3* 1.8  PHOS 7.5* 8.4* 6.1* 5.0* 3.1   Liver Function Tests: Recent Labs  Lab 11/08/17 0458 11/11/17 1829 11/11/17 2127 11/12/17 0159 11/12/17 0556 11/12/17 0949  AST 75*  --   --   --   --   --   ALT 75*  --   --   --   --   --   ALKPHOS 102  --   --   --   --   --   BILITOT 1.9*  --   --   --   --   --   PROT 5.5*  --   --   --   --   --   ALBUMIN 2.2* 1.7* 1.7* 1.4* 1.2* 2.2*    Recent Labs  Lab 11/06/17 0426 11/07/17 1343  AMMONIA 29 32   CBC: Recent Labs  Lab 11/08/17 0458 11/09/17 0733 11/10/17 0503 11/12/17 0556  WBC 17.6* 24.8* 30.7* 14.1*  NEUTROABS 15.6* 23.1* 28.5*  --  HGB 11.5* 11.9* 11.6* 10.2*  HCT 32.9* 35.1* 34.6* 30.3*  MCV 95.5 96.6 94.7 96.3  PLT 187 219 252 182   4CBG: Recent Labs  Lab 11/11/17 1944 11/11/17 2339 11/12/17 0408 11/12/17 0800 11/12/17 1208  GLUCAP 150* 146* 107* 125* 118*    Recent Results (from the past 240 hour(s))  Culture, respiratory (NON-Expectorated)     Status: None   Collection Time: 11/08/17  5:14 AM  Result Value Ref Range Status   Specimen Description   Final    TRACHEAL ASPIRATE Performed at [facility], [address] [place], [place] [address]    Special Requests   Final    NONE Performed at [facility], [address] [place], [place] [address]    Gram Stain   Final    FEW WBC PRESENT, PREDOMINANTLY PMN ABUNDANT GRAM POSITIVE COCCI ABUNDANT GRAM NEGATIVE RODS Performed at [facility], [place] [address], [place], [place] [address]    Culture ABUNDANT STAPHYLOCOCCUS AUREUS  Final   Report Status 11/10/2017 FINAL  Final   Organism ID, Bacteria STAPHYLOCOCCUS AUREUS  Final       Susceptibility   Staphylococcus aureus - MIC*    CIPROFLOXACIN <=0.5 SENSITIVE Sensitive     ERYTHROMYCIN <=0.25 SENSITIVE Sensitive     GENTAMICIN <=0.5 SENSITIVE Sensitive     OXACILLIN 0.5 SENSITIVE Sensitive     TETRACYCLINE <=1 SENSITIVE Sensitive     VANCOMYCIN 1 SENSITIVE Sensitive     TRIMETH/SULFA <=10 SENSITIVE Sensitive     CLINDAMYCIN <=0.25 SENSITIVE Sensitive     RIFAMPIN <=0.5 SENSITIVE Sensitive     Inducible Clindamycin NEGATIVE Sensitive     * ABUNDANT STAPHYLOCOCCUS AUREUS  Urine Culture     Status: Abnormal   Collection Time: 11/10/17  6:34 AM  Result Value Ref Range Status   Specimen Description   Final    URINE, RANDOM Performed at [facility], [address] [place], [place] [address]    Special Requests   Final    NONE Performed at [facility], [address] [place], [place] [address]    Culture (A)  Final    20,000 COLONIES/mL STAPHYLOCOCCUS SPECIES (COAGULASE NEGATIVE)   Report Status 11/12/2017 FINAL  Final   Organism ID, Bacteria STAPHYLOCOCCUS SPECIES (COAGULASE NEGATIVE) (A)  Final      Susceptibility   Staphylococcus species (coagulase negative) - MIC*    CIPROFLOXACIN <=0.5 SENSITIVE Sensitive     GENTAMICIN <=0.5 SENSITIVE Sensitive     NITROFURANTOIN <=16 SENSITIVE Sensitive     OXACILLIN <=0.25 SENSITIVE Sensitive     TETRACYCLINE <=1 SENSITIVE Sensitive     VANCOMYCIN <=0.5 SENSITIVE Sensitive     TRIMETH/SULFA <=10 SENSITIVE Sensitive     CLINDAMYCIN 0.5 SENSITIVE Sensitive     RIFAMPIN <=0.5 SENSITIVE Sensitive     Inducible Clindamycin NEGATIVE Sensitive     * 20,000 COLONIES/mL STAPHYLOCOCCUS SPECIES (COAGULASE NEGATIVE)  CULTURE, BLOOD (ROUTINE X 2) w Reflex to ID Panel     Status: None (Preliminary result)   Collection Time: 11/10/17  8:15 AM  Result Value Ref Range Status   Specimen Description BLOOD BLOOD LEFT HAND  Final   Special Requests   Final    BOTTLES DRAWN AEROBIC AND ANAEROBIC Blood Culture  adequate volume   Culture   Final    NO GROWTH 2 DAYS Performed at [facility], [address] [place], [place] [address]    Report Status PENDING  Incomplete  CULTURE, BLOOD (ROUTINE X 2) w Reflex to ID Panel     Status: None (Preliminary result)   Collection Time: 11/10/17  8:25 AM  Result Value Ref Range Status   Specimen Description BLOOD BLOOD RIGHT HAND  Final   Special Requests   Final    BOTTLES DRAWN AEROBIC AND ANAEROBIC Blood Culture adequate volume   Culture   Final    NO GROWTH 2 DAYS Performed at [facility], [address] [place], [place] [address]    Report Status PENDING  Incomplete  Culture, respiratory (NON-Expectorated)     Status: None   Collection Time: 11/10/17 11:13 AM  Result Value Ref Range Status   Specimen Description   Final    TRACHEAL ASPIRATE Performed at [facility], [address], [place], [place] [address]    Special Requests   Final    NONE Performed at [facility], [address] [place], [place] [address]    Gram Stain   Final    RARE WBC PRESENT, PREDOMINANTLY PMN RARE GRAM NEGATIVE COCCOBACILLI Performed at [facility], [place] [address], [place], [place] [address]    Culture MODERATE STAPHYLOCOCCUS AUREUS  Final   Report Status 11/12/2017 FINAL  Final   Organism ID, Bacteria STAPHYLOCOCCUS AUREUS  Final      Susceptibility   Staphylococcus aureus - MIC*    CIPROFLOXACIN <=0.5 SENSITIVE Sensitive     ERYTHROMYCIN <=0.25 SENSITIVE Sensitive     GENTAMICIN <=0.5 SENSITIVE Sensitive     OXACILLIN 0.5 SENSITIVE Sensitive     TETRACYCLINE <=1 SENSITIVE Sensitive     VANCOMYCIN <=0.5 SENSITIVE Sensitive     TRIMETH/SULFA <=10 SENSITIVE Sensitive     CLINDAMYCIN <=0.25 SENSITIVE Sensitive     RIFAMPIN <=0.5 SENSITIVE Sensitive     Inducible Clindamycin NEGATIVE Sensitive     * MODERATE STAPHYLOCOCCUS AUREUS  Aerobic/Anaerobic Culture (surgical/deep wound)     Status: None  (Preliminary result)   Collection Time: 11/11/17  6:01 PM  Result Value Ref Range Status   Specimen Description WOUND  Final   Special Requests   Final    Normal Performed at [facility], [address] [place], [place] [address]    Gram Stain   Final    ABUNDANT WBC PRESENT, PREDOMINANTLY PMN MODERATE GRAM POSITIVE COCCI RARE GRAM POSITIVE RODS    Culture PENDING  Incomplete   Report Status PENDING  Incomplete     Studies: Dg Chest 1 View  Result Date: 11/11/2017 CLINICAL DATA:  Central line placement. EXAM: CHEST  1 VIEW COMPARISON:  Chest x-ray dated November 08, 2017. FINDINGS: Interval placement of a left internal jugular dialysis catheter with the tip in the distal SVC. Unchanged positioning of the endotracheal and enteric tubes. Worsening right-sided pleural effusion with near complete opacification of the right hemithorax. There is some aeration within the right upper lobe. No mediastinal shift. Unchanged atelectasis at the left lung base. No definite pneumothorax. No acute osseous abnormality. IMPRESSION: 1. Worsening right-sided pleural effusion with near complete opacification of the right hemithorax. Recommend chest CT for further evaluation. 2. Interval placement of a left internal jugular dialysis catheter with the tip in the distal SVC. No definite pneumothorax. Electronically Signed   By: Titus Dubin M.D.   On: 11/11/2017 15:47   Ct Head Wo Contrast  Result Date: 11/11/2017 CLINICAL DATA:  Altered mental status. EXAM: CT HEAD WITHOUT CONTRAST TECHNIQUE: Contiguous axial images were obtained from the base of the  skull through the vertex without intravenous contrast. COMPARISON:  None. FINDINGS: Brain: Hypodensity and loss of the normal gray-white matter differentiation within the left parafalcine parietal lobe, concerning for acute to subacute infarct. Additional focal hypodensity within the genu of left internal capsule. No hemorrhage, hydrocephalus, extra-axial  collection or mass lesion/mass effect. Vascular: No hyperdense vessel or unexpected calcification. Skull: Normal. Negative for fracture or focal lesion. Sinuses/Orbits: Complete opacification of the left frontal, maxillary, and sphenoid sinuses, as well as the left ethmoid air cells. Partial opacification of the bilateral mastoid air cells. No acute orbital abnormality. Other: None. IMPRESSION: 1. Hypodensity within the left parafalcine parietal lobe, concerning for acute to subacute infarct. 2. 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 3. Extensive left-sided paranasal sinus disease. These results were called by telephone at the time of interpretation on 11/11/2017 at 9:52 am to Dr. Flora Lipps , who verbally acknowledged these results. Electronically Signed   By: Titus Dubin M.D.   On: 11/11/2017 09:53   Ct Chest Wo Contrast  Result Date: 11/11/2017 CLINICAL DATA:  62 year old male with opacified RIGHT hemithorax and respiratory failure. EXAM: CT CHEST WITHOUT CONTRAST TECHNIQUE: Multidetector CT imaging of the chest was performed following the standard protocol without IV contrast. COMPARISON:  11/11/2017 and prior radiographs FINDINGS: Cardiovascular: Mild cardiomegaly and coronary artery atherosclerotic calcifications noted. Aortic atherosclerotic calcifications identified without aneurysm. No pericardial effusion. Mediastinum/Nodes: Shotty mediastinal lymph nodes are present. An endotracheal tube is present with tip approximately 4 cm above the carina. A LEFT IJ central venous catheter is noted with tip in the mid SVC. An NG tube is noted with tip in the mid stomach. No mediastinal mass identified. Lungs/Pleura: A large possibly loculated RIGHT hydropneumothorax noted. Atelectasis of the majority of the RIGHT lung noted. Airspace disease within the lingula noted. Atelectasis/consolidation of the LEFT LOWER lobe noted. Upper Abdomen: No acute abnormality.  Musculoskeletal: No acute or suspicious bony abnormalities. IMPRESSION: 1. Large possibly loculated RIGHT hydropneumothorax which may be related to infection or possibly malignancy. Associated compressive atelectasis of the majority of the RIGHT lung. Clinical team is aware of this per note. 2. Lingular airspace disease likely representing aspiration or pneumonia. LEFT LOWER lobe atelectasis/consolidation. 3. Support apparatus as described 4. Mild cardiomegaly and coronary artery disease 5.  Aortic Atherosclerosis (ICD10-I70.0). Electronically Signed   By: Margarette Canada M.D.   On: 11/11/2017 17:28   Dg Chest Port 1 View  Result Date: 11/12/2017 CLINICAL DATA:  Respiratory failure EXAM: PORTABLE CHEST 1 VIEW COMPARISON:  November 11, 2017 chest radiograph and chest CT FINDINGS: Endotracheal tube tip is 4.8 cm above the carina. Central catheter tip is in superior vena cava. Nasogastric tube tip and side port are below the diaphragm. There is now a pigtail catheter in the right hemithorax. There has been significant drainage of right pleural effusion compared to 1 day prior. Moderate loculated effusion remains on the right. There is atelectatic change throughout the right mid and lower lung zones. There is focal consolidation in the medial left base with minimal left pleural effusion. Heart is upper normal in size with pulmonary vascularity within normal limits. No adenopathy. No bone lesions. IMPRESSION: Tube and catheter positions as described without pneumothorax. Pigtail catheter now present on the right with considerable resolution of pleural effusion on the right. Moderate partially loculated pleural effusion remains on the right. There is a minimal pleural effusion on the left. There is atelectatic change throughout right mid lower lung zones. Stable consolidation  medial left base. Stable cardiac silhouette. Electronically Signed   By: Lowella Grip III M.D.   On: 11/12/2017 07:33   Ct Image Guided Fluid  Drain By Catheter  Result Date: 11/12/2017 INDICATION: Sepsis and hydropneumothorax. Please perform CT-guided placement for infection source control purposes. EXAM: CT IMAGE GUIDED FLUID DRAIN BY CATHETER COMPARISON:  Chest CT - earlier same day MEDICATIONS: The patient is currently admitted to the hospital and receiving intravenous antibiotics. The antibiotics were administered within an appropriate time frame prior to the initiation of the procedure. ANESTHESIA/SEDATION: None, the patient is currently intubated and sedated CONTRAST:  None COMPLICATIONS: None immediate. PROCEDURE: Informed written consent was obtained from the patient's family after a discussion of the risks, benefits and alternatives to treatment. The patient was placed supine on the CT gantry and a pre procedural CT was performed re-demonstrating the known moderate to large sized right-sided hydropneumothorax. The procedure was planned. A timeout was performed prior to the initiation of the procedure. The skin overlying the right anterior upper chest was prepped and draped in the usual sterile fashion. The overlying soft tissues were anesthetized with 1% lidocaine with epinephrine. Appropriate trajectory was planned with the use of a 22 gauge spinal needle. An 18 gauge trocar needle was advanced into the right pleural space and a short Amplatz super stiff wire was coiled within the collection. Appropriate positioning was confirmed with a limited CT scan. The tract was serially dilated allowing placement of a 12 [place] all-purpose drainage catheter. Appropriate positioning was confirmed with a limited postprocedural CT scan. Approximately 1.8 L of purulent fluid was aspirated. The tube was connected to a pleural vac device and sutured in place. A dressing was placed. Postprocedural imaging demonstrates significant reduction in size of right-sided hydropneumothorax with partial re-expansion of the right lung. The patient tolerated the procedure  well without immediate post procedural complication. IMPRESSION: Successful CT guided placement of a 21 French all purpose drain catheter into the right pleural space with aspiration of 1.8 L of purulent fluid. Samples were sent to the laboratory as requested by the ordering clinical team. Electronically Signed   By: Sandi Mariscal M.D.   On: 11/12/2017 07:38    Scheduled Meds: . budesonide (PULMICORT) nebulizer solution  0.25 mg Nebulization BID  . chlorhexidine gluconate (MEDLINE KIT)  15 mL Mouth Rinse BID  . feeding supplement (PRO-STAT SUGAR FREE 64)  30 mL Per Tube TID  . folic acid  1 mg Per Tube Daily  . heparin injection (subcutaneous)  5,000 Units Subcutaneous Q8H  . insulin aspart  0-9 Units Subcutaneous Q4H  . ipratropium-albuterol  3 mL Nebulization Q4H  . mouth rinse  15 mL Mouth Rinse 10 times per day  . multivitamin  15 mL Per Tube Daily  . pantoprazole sodium  40 mg Per Tube BID  . senna-docusate  2 tablet Per Tube BID  . thiamine  100 mg Per Tube Daily   Continuous Infusions: . amiodarone 30 mg/hr (11/12/17 1034)  .  ceFAZolin (ANCEF) IV Stopped (11/12/17 1452)  . dextrose 50 mL/hr at 11/12/17 0600  . feeding supplement (VITAL 1.5 CAL) 55 mL/hr at 11/12/17 1454  . fentaNYL infusion INTRAVENOUS Stopped (11/12/17 1116)  . small volume/piggyback Museum/gallery curator    . norepinephrine (LEVOPHED) Adult infusion 10 mcg/min (11/12/17 1422)  . phenylephrine (NEO-SYNEPHRINE) Adult infusion 15 mcg/min (11/12/17 1422)  . pureflow    . vasopressin (PITRESSIN) infusion - *FOR SHOCK* 0.04 Units/min (11/12/17 1030)    Assessment/Plan:  1. Delirium  tremens and alcohol withdrawal.  Continue current therapy 2. Septic shock with Mssa pneumonia on cefazolin.  Pro-calcitonin elevated.  Vasopressors to keep blood pressure greater than 65.  Treatment per critical care team. 3. SVT, atrial fibrillation, nonsustained ventricular tachycardia.  Continue amiodarone drip. 4. Acute to subacute stroke with  acute encephalopathy and not waking up once off sedation.  Critical care team waiting  to speak with the family about whether trach and PEG needed prior to starting any anticoagulation. 5. Acute hypoxic respiratory failure.  patient on 70% FiO2.    6. Angioedema secondary to lisinopril.  This has improved with supportive care. 7. Acute kidney injury.  Creatinine peaked at 5.33 and came down to 1.86 and then worsened again and up at 3.56. 8. Hypernatremia on free water 9. NG tube feeding 10. Hypomagnesemia replaced  Code Status:     Code Status Orders  (From admission, onward)        Start     Ordered   11/19/2017 2322  Full code  Continuous     10/26/2017 2321    Code Status History    This patient has a current code status but no historical code status.     Family Communication: As per critical care specialist  disposition Plan: To be determined  Consultants:  Critical care team  Time spent: 25 minutes Case discussed with critical care specialist.  [facility]

## 2017-11-12 NOTE — Progress Notes (Signed)
Patient ID: Dustin Orozco, male   DOB: Nov 29, 1955, 62 y.o.   MRN: [id number] Pulmonary/critical care attending  Procedure note Replacement of hemodialysis catheter Indication: Unable to obtain flow from 1 of the ports Tri cath in place Performed under sterile procedure Complete contact barrier precautions utilized Sutures removed Using the center port a guidewire was placed and the catheter was removed The catheter tip was bent Using the guidewire the catheter was advanced into position and the guidewire was removed intact Sutured into place All 3 ports had easy venous return Dressed by nurse Stat portable chest x-ray pending  Dustin KindredJohn Deontaye Civello, DO

## 2017-11-12 NOTE — Progress Notes (Signed)
Notified MD Conforti that blood pressure and heart rate have continued to drop despite increasing norepinephrine and decreasing amiodarone. See new orders, will continue to monitor patient.

## 2017-11-12 NOTE — Progress Notes (Signed)
Chief Complaint: Patient was seen today for Follow up chest tube   Supervising Physician: Daryll Brod  Patient Status: [facility] - In-pt  Subjective: S/p (R)chest tube placement for large right hydroPTX Remains intubated/sedated  Objective: Physical Exam: BP 132/74   Pulse (!) 55   Temp 97.8 F (36.6 C) (Oral)   Resp (!) 24   Ht 6' 1" (1.854 m)   Wt 212 lb 11.9 oz (96.5 kg)   SpO2 100%   BMI 28.07 kg/m  Intubated/sedated (R)chest tube intact, site clean Another 400 mL cloudy yellow output since placement No air leak   Current Facility-Administered Medications:  .  acetaminophen (TYLENOL) solution 650 mg, 650 mg, Per Tube, Q6H PRN, Flora Lipps, MD, 650 mg at 11/10/17 0534 .  [COMPLETED] amiodarone (NEXTERONE) 1.8 mg/mL load via infusion 150 mg, 150 mg, Intravenous, Once, 150 mg at 11/10/17 1418 **FOLLOWED BY** [EXPIRED] amiodarone (NEXTERONE PREMIX) 360-4.14 MG/200ML-% (1.8 mg/mL) IV infusion, 60 mg/hr, Intravenous, Continuous, Stopped at 11/10/17 2021 **FOLLOWED BY** amiodarone (NEXTERONE PREMIX) 360-4.14 MG/200ML-% (1.8 mg/mL) IV infusion, 60 mg/hr, Intravenous, Continuous, Blakeney, Dana G, NP, Last Rate: 16.7 mL/hr at 11/12/17 0806, 30 mg/hr at 11/12/17 0806 .  bisacodyl (DULCOLAX) suppository 10 mg, 10 mg, Rectal, Daily PRN, Patria Mane, Magadalene S, NP .  budesonide (PULMICORT) nebulizer solution 0.25 mg, 0.25 mg, Nebulization, BID, Kasa, Kurian, MD, 0.25 mg at 11/12/17 0735 .  ceFAZolin (ANCEF) IVPB 2g/100 mL premix, 2 g, Intravenous, Q8H, Wieting, Richard, MD, Last Rate: 200 mL/hr at 11/12/17 0503, 2 g at 11/12/17 0503 .  chlorhexidine gluconate (MEDLINE KIT) (PERIDEX) 0.12 % solution 15 mL, 15 mL, Mouth Rinse, BID, Tukov, Magadalene S, NP, 15 mL at 11/12/17 0835 .  dextrose 5 % solution, , Intravenous, Continuous, Kasa, Kurian, MD, Last Rate: 50 mL/hr at 11/12/17 0600 .  feeding supplement (PRO-STAT SUGAR FREE 64) liquid 30 mL, 30 mL, Per Tube, BID, Flora Lipps, MD, 30  mL at 11/11/17 2115 .  feeding supplement (VITAL 1.5 CAL) liquid 1,000 mL, 1,000 mL, Per Tube, Continuous, Flora Lipps, MD, Stopped at 11/11/17 1616 .  fentaNYL (SUBLIMAZE) bolus via infusion 50 mcg, 50 mcg, Intravenous, Q1H PRN, Flora Lipps, MD, 50 mcg at 11/09/17 1437 .  fentaNYL 2554mg in NS 2573m(1065mml) infusion-PREMIX, 25-400 mcg/hr, Intravenous, Continuous, Kasa, Kurian, MD, Last Rate: 30 mL/hr at 11/12/17 0857, 300 mcg/hr at 11/12/17 0857 .  folic acid (FOLVITE) tablet 1 mg, 1 mg, Per Tube, Daily, RicLafayette DragonD, 1 mg at 11/11/17 0900 .  free water 250 mL, 250 mL, Per Tube, Q4H, Kasa, Kurian, MD, 250 mL at 11/12/17 0800 .  heparin injection 1,000-6,000 Units, 1,000-6,000 Units, CRRT, PRN, Lateef, Munsoor, MD .  heparin injection 5,000 Units, 5,000 Units, Subcutaneous, Q8H, MacRamond DialPH, 5,000 Units at 11/12/17 0554 .  hydrALAZINE (APRESOLINE) injection 10-40 mg, 10-40 mg, Intravenous, Q4H PRN, Tukov, Magadalene S, NP, 40 mg at 11/07/17 1227 .  insulin aspart (novoLOG) injection 0-9 Units, 0-9 Units, Subcutaneous, Q4H, KasFlora LippsD, 1 Units at 11/12/17 [phone] ipratropium-albuterol (DUONEB) 0.5-2.5 (3) MG/3ML nebulizer solution 3 mL, 3 mL, Nebulization, Q4H PRN, SimWilhelmina McardleD, 3 mL at 11/08/17 0404 .  ipratropium-albuterol (DUONEB) 0.5-2.5 (3) MG/3ML nebulizer solution 3 mL, 3 mL, Nebulization, Q4H, Kasa, Kurian, MD, 3 mL at 11/12/17 0735 .  LORazepam (ATIVAN) injection 0.5-1 mg, 0.5-1 mg, Intravenous, Q2H PRN, SimWilhelmina McardleD, 1 mg at 11/08/17 0532 .  magnesium sulfate IVPB 2 g 50 mL,  2 g, Intravenous, Once, Dustin Flock, MD, Last Rate: 50 mL/hr at 11/12/17 0832, 2 g at 11/12/17 0832 .  MEDLINE mouth rinse, 15 mL, Mouth Rinse, 10 times per day, Dorene Sorrow S, NP, 15 mL at 11/12/17 0508 .  metoprolol tartrate (LOPRESSOR) injection 2.5-5 mg, 2.5-5 mg, Intravenous, Q3H PRN, Wilhelmina Mcardle, MD, 5 mg at 11/10/17 1355 .  multivitamin liquid 15 mL,  15 mL, Per Tube, Daily, Lafayette Dragon, MD, 15 mL at 11/11/17 0900 .  norepinephrine (LEVOPHED) 16 mg in dextrose 5 % 250 mL (0.064 mg/mL) infusion, 0-40 mcg/min, Intravenous, Titrated, Wieting, Richard, MD, Last Rate: 37.5 mL/hr at 11/12/17 0747, 40 mcg/min at 11/12/17 0747 .  ondansetron (ZOFRAN) injection 4 mg, 4 mg, Intravenous, Q6H PRN, Lance Coon, MD, 4 mg at 11/06/17 0932 .  pantoprazole sodium (PROTONIX) 40 mg/20 mL oral suspension 40 mg, 40 mg, Per Tube, BID, Loletha Grayer, MD, 40 mg at 11/11/17 2129 .  phenylephrine (NEO-SYNEPHRINE) 40 mg in sodium chloride 0.9 % 250 mL (0.16 mg/mL) infusion, 0-400 mcg/min, Intravenous, Titrated, Charlett Nose, RPH .  pureflow IV solution for Dialysis, , CRRT, Continuous, Lateef, Munsoor, MD .  senna-docusate (Senokot-S) tablet 2 tablet, 2 tablet, Per Tube, BID, Flora Lipps, MD, 2 tablet at 11/11/17 2115 .  sennosides (SENOKOT) 8.8 MG/5ML syrup 5 mL, 5 mL, Per Tube, BID PRN, Patria Mane, Magadalene S, NP, 5 mL at 11/06/17 2351 .  thiamine (VITAMIN B-1) tablet 100 mg, 100 mg, Per Tube, Daily, Lafayette Dragon, MD, 100 mg at 11/11/17 0900 .  vasopressin (PITRESSIN) 40 Units in sodium chloride 0.9 % 250 mL (0.16 Units/mL) infusion, 0.04 Units/min, Intravenous, Continuous, Conforti, John, DO .  vecuronium (NORCURON) injection 10 mg, 10 mg, Intravenous, Q1H PRN, Awilda Bill, NP, 10 mg at 11/11/17 1505  Labs: CBC Recent Labs    11/10/17 0503 11/12/17 0556  WBC 30.7* 14.1*  HGB 11.6* 10.2*  HCT 34.6* 30.3*  PLT 252 182   BMET Recent Labs    11/12/17 0159 11/12/17 0556  NA 147* 144  K 4.0 3.5  CL 113* 115*  CO2 25 23  GLUCOSE 142* 128*  BUN 104* 90*  CREATININE 3.13* 2.48*  CALCIUM 7.2* 6.3*   LFT Recent Labs    11/12/17 0556  ALBUMIN 1.2*   PT/INR No results for input(s): LABPROT, INR in the last 72 hours.   Studies/Results: Dg Chest 1 View  Result Date: 11/11/2017 CLINICAL DATA:  Central line placement. EXAM: CHEST   1 VIEW COMPARISON:  Chest x-ray dated November 08, 2017. FINDINGS: Interval placement of a left internal jugular dialysis catheter with the tip in the distal SVC. Unchanged positioning of the endotracheal and enteric tubes. Worsening right-sided pleural effusion with near complete opacification of the right hemithorax. There is some aeration within the right upper lobe. No mediastinal shift. Unchanged atelectasis at the left lung base. No definite pneumothorax. No acute osseous abnormality. IMPRESSION: 1. Worsening right-sided pleural effusion with near complete opacification of the right hemithorax. Recommend chest CT for further evaluation. 2. Interval placement of a left internal jugular dialysis catheter with the tip in the distal SVC. No definite pneumothorax. Electronically Signed   By: Titus Dubin M.D.   On: 11/11/2017 15:47   Ct Head Wo Contrast  Result Date: 11/11/2017 CLINICAL DATA:  Altered mental status. EXAM: CT HEAD WITHOUT CONTRAST TECHNIQUE: Contiguous axial images were obtained from the base of the skull through the vertex without intravenous contrast. COMPARISON:  None. FINDINGS:  Brain: Hypodensity and loss of the normal gray-white matter differentiation within the left parafalcine parietal lobe, concerning for acute to subacute infarct. Additional focal hypodensity within the genu of left internal capsule. No hemorrhage, hydrocephalus, extra-axial collection or mass lesion/mass effect. Vascular: No hyperdense vessel or unexpected calcification. Skull: Normal. Negative for fracture or focal lesion. Sinuses/Orbits: Complete opacification of the left frontal, maxillary, and sphenoid sinuses, as well as the left ethmoid air cells. Partial opacification of the bilateral mastoid air cells. No acute orbital abnormality. Other: None. IMPRESSION: 1. Hypodensity within the left parafalcine parietal lobe, concerning for acute to subacute infarct. 2. 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 3. Extensive left-sided paranasal sinus disease. These results were called by telephone at the time of interpretation on 11/11/2017 at 9:52 am to Dr. Flora Lipps , who verbally acknowledged these results. Electronically Signed   By: Titus Dubin M.D.   On: 11/11/2017 09:53   Ct Chest Wo Contrast  Result Date: 11/11/2017 CLINICAL DATA:  62 year old male with opacified RIGHT hemithorax and respiratory failure. EXAM: CT CHEST WITHOUT CONTRAST TECHNIQUE: Multidetector CT imaging of the chest was performed following the standard protocol without IV contrast. COMPARISON:  11/11/2017 and prior radiographs FINDINGS: Cardiovascular: Mild cardiomegaly and coronary artery atherosclerotic calcifications noted. Aortic atherosclerotic calcifications identified without aneurysm. No pericardial effusion. Mediastinum/Nodes: Shotty mediastinal lymph nodes are present. An endotracheal tube is present with tip approximately 4 cm above the carina. A LEFT IJ central venous catheter is noted with tip in the mid SVC. An NG tube is noted with tip in the mid stomach. No mediastinal mass identified. Lungs/Pleura: A large possibly loculated RIGHT hydropneumothorax noted. Atelectasis of the majority of the RIGHT lung noted. Airspace disease within the lingula noted. Atelectasis/consolidation of the LEFT LOWER lobe noted. Upper Abdomen: No acute abnormality. Musculoskeletal: No acute or suspicious bony abnormalities. IMPRESSION: 1. Large possibly loculated RIGHT hydropneumothorax which may be related to infection or possibly malignancy. Associated compressive atelectasis of the majority of the RIGHT lung. Clinical team is aware of this per note. 2. Lingular airspace disease likely representing aspiration or pneumonia. LEFT LOWER lobe atelectasis/consolidation. 3. Support apparatus as described 4. Mild cardiomegaly and coronary artery disease 5.  Aortic Atherosclerosis (ICD10-I70.0). Electronically Signed   By:  Margarette Canada M.D.   On: 11/11/2017 17:28   Dg Chest Port 1 View  Result Date: 11/12/2017 CLINICAL DATA:  Respiratory failure EXAM: PORTABLE CHEST 1 VIEW COMPARISON:  November 11, 2017 chest radiograph and chest CT FINDINGS: Endotracheal tube tip is 4.8 cm above the carina. Central catheter tip is in superior vena cava. Nasogastric tube tip and side port are below the diaphragm. There is now a pigtail catheter in the right hemithorax. There has been significant drainage of right pleural effusion compared to 1 day prior. Moderate loculated effusion remains on the right. There is atelectatic change throughout the right mid and lower lung zones. There is focal consolidation in the medial left base with minimal left pleural effusion. Heart is upper normal in size with pulmonary vascularity within normal limits. No adenopathy. No bone lesions. IMPRESSION: Tube and catheter positions as described without pneumothorax. Pigtail catheter now present on the right with considerable resolution of pleural effusion on the right. Moderate partially loculated pleural effusion remains on the right. There is a minimal pleural effusion on the left. There is atelectatic change throughout right mid lower lung zones. Stable consolidation medial left base. Stable cardiac silhouette. Electronically Signed   By:  Lowella Grip III M.D.   On: 11/12/2017 07:33   Ct Image Guided Fluid Drain By Catheter  Result Date: 11/12/2017 INDICATION: Sepsis and hydropneumothorax. Please perform CT-guided placement for infection source control purposes. EXAM: CT IMAGE GUIDED FLUID DRAIN BY CATHETER COMPARISON:  Chest CT - earlier same day MEDICATIONS: The patient is currently admitted to the hospital and receiving intravenous antibiotics. The antibiotics were administered within an appropriate time frame prior to the initiation of the procedure. ANESTHESIA/SEDATION: None, the patient is currently intubated and sedated CONTRAST:  None COMPLICATIONS:  None immediate. PROCEDURE: Informed written consent was obtained from the patient's family after a discussion of the risks, benefits and alternatives to treatment. The patient was placed supine on the CT gantry and a pre procedural CT was performed re-demonstrating the known moderate to large sized right-sided hydropneumothorax. The procedure was planned. A timeout was performed prior to the initiation of the procedure. The skin overlying the right anterior upper chest was prepped and draped in the usual sterile fashion. The overlying soft tissues were anesthetized with 1% lidocaine with epinephrine. Appropriate trajectory was planned with the use of a 22 gauge spinal needle. An 18 gauge trocar needle was advanced into the right pleural space and a short Amplatz super stiff wire was coiled within the collection. Appropriate positioning was confirmed with a limited CT scan. The tract was serially dilated allowing placement of a 12 [place] all-purpose drainage catheter. Appropriate positioning was confirmed with a limited postprocedural CT scan. Approximately 1.8 L of purulent fluid was aspirated. The tube was connected to a pleural vac device and sutured in place. A dressing was placed. Postprocedural imaging demonstrates significant reduction in size of right-sided hydropneumothorax with partial re-expansion of the right lung. The patient tolerated the procedure well without immediate post procedural complication. IMPRESSION: Successful CT guided placement of a 76 French all purpose drain catheter into the right pleural space with aspiration of 1.8 L of purulent fluid. Samples were sent to the laboratory as requested by the ordering clinical team. Electronically Signed   By: Sandi Mariscal M.D.   On: 11/12/2017 07:38    Assessment/Plan: Resp failure Large (R)hydroPTX S/p pigtail chest tube placement Monitor output and CXR IR following.    LOS: 12 days   I spent a total of 20 minutes in face to face in  clinical consultation, greater than 50% of which was counseling/coordinating care for right chest tube care  Ascencion Dike PA-C 11/12/2017 9:30 AM

## 2017-11-12 NOTE — Progress Notes (Signed)
Nephrologist called as note presented saying 2K bath and order reading 4K , and presented with newly resulted potassium level. 2K bags currently hanging and switched to 4K bath. Will continue to monitor.

## 2017-11-12 NOTE — Consult Note (Signed)
[facility] CONSULT NOTE    Date: 11/12/2017                  Patient Name:  Cutberto Winfree  MRN: [id number]  DOB: [birth year]  Age / Sex: 62 y.o., male         PCP: [place]                 Service Requesting Consult: Critical Care                 Reason for Consult: Hospitalist            History of Present Illness: Patient is a 62 y.o. male with a PMHx of osteoarthritis, hypertension, GERD who was admitted to [facility] on 11/21/2017 for evaluation of angioedema.  Patient was previously on lisinopril.  Over the course of the hospitalization he has deteriorated significantly.  He is currently maintained on the ventilator.  He also developed acute renal failure and was started on CRRT yesterday evening.  The patient's dialysis catheter clotted early this a.m. and TPA is currently being 12.  Patient also was found to have a hydropneumothorax and is status post chest tube placement.  Patient is maintained on levo fed as well as Neo-Synephrine.   Medications: Outpatient medications: Medications Prior to Admission  Medication Sig Dispense Refill Last Dose  . amLODipine (NORVASC) 10 MG tablet Take 10 mg by mouth daily.      Marland Kitchen ammonium lactate (LAC-HYDRIN) 12 % lotion Apply 1 application topically as needed for dry skin.     . carvedilol (COREG) 25 MG tablet Take 25 mg by mouth 2 (two) times daily with a meal.     . loratadine (CLARITIN) 10 MG tablet Take 10 mg by mouth daily.      . pantoprazole (PROTONIX) 40 MG tablet Take 40 mg by mouth 2 (two) times daily.      . potassium chloride SA (K-DUR,KLOR-CON) 20 MEQ tablet Take 20 mEq by mouth 2 (two) times daily.     . sildenafil (VIAGRA) 100 MG tablet Take 100 mg by mouth as needed for erectile dysfunction.      . triamterene-hydrochlorothiazide (MAXZIDE-25) 37.5-25 MG tablet Take 1 tablet by mouth daily.        Current medications: Current Facility-Administered Medications  Medication Dose Route Frequency  Provider Last Rate Last Dose  . acetaminophen (TYLENOL) solution 650 mg  650 mg Per Tube Q6H PRN Flora Lipps, MD   650 mg at 11/10/17 0534  . amiodarone (NEXTERONE PREMIX) 360-4.14 MG/200ML-% (1.8 mg/mL) IV infusion  60 mg/hr Intravenous Continuous Awilda Bill, NP 16.7 mL/hr at 11/12/17 0806 30 mg/hr at 11/12/17 0806  . bisacodyl (DULCOLAX) suppository 10 mg  10 mg Rectal Daily PRN Dorene Sorrow S, NP      . budesonide (PULMICORT) nebulizer solution 0.25 mg  0.25 mg Nebulization BID Flora Lipps, MD   0.25 mg at 11/12/17 0735  . ceFAZolin (ANCEF) IVPB 2g/100 mL premix  2 g Intravenous Q8H Loletha Grayer, MD 200 mL/hr at 11/12/17 0503 2 g at 11/12/17 0503  . chlorhexidine gluconate (MEDLINE KIT) (PERIDEX) 0.12 % solution 15 mL  15 mL Mouth Rinse BID Dorene Sorrow S, NP   15 mL at 11/12/17 0835  . dextrose 5 % solution   Intravenous Continuous Flora Lipps, MD 50 mL/hr at 11/12/17 0600    . feeding supplement (PRO-STAT SUGAR FREE 64) liquid 30 mL  30 mL Per Tube BID  Flora Lipps, MD   30 mL at 11/11/17 2115  . feeding supplement (VITAL 1.5 CAL) liquid 1,000 mL  1,000 mL Per Tube Continuous Flora Lipps, MD   Stopped at 11/11/17 1616  . fentaNYL (SUBLIMAZE) bolus via infusion 50 mcg  50 mcg Intravenous Q1H PRN Flora Lipps, MD   50 mcg at 11/09/17 1437  . fentaNYL 2535mg in NS 2537m(1091mml) infusion-PREMIX  25-400 mcg/hr Intravenous Continuous KasFlora LippsD 10 mL/hr at 11/12/17 1004 100 mcg/hr at 11/12/17 1004  . folic acid (FOLVITE) tablet 1 mg  1 mg Per Tube Daily RicLaf[place] DragonD   1 mg at 11/11/17 0900  . free water 250 mL  250 mL Per Tube Q4H KasFlora LippsD   250 mL at 11/12/17 0800  . heparin injection 1,000-6,000 Units  1,000-6,000 Units CRRT PRN Terrilynn Postell, MD      . heparin injection 5,000 Units  5,000 Units Subcutaneous Q8H MacMarcelle Overlie RPH   5,000 Units at 11/12/17 0554  . hydrALAZINE (APRESOLINE) injection 10-40 mg  10-40 mg Intravenous Q4H PRN TukDorene Sorrow NP   40 mg at 11/07/17 1227  . insulin aspart (novoLOG) injection 0-9 Units  0-9 Units Subcutaneous Q4H KasFlora LippsD   1 Units at 11/12/17 [id number] ipratropium-albuterol (DUONEB) 0.5-2.5 (3) MG/3ML nebulizer solution 3 mL  3 mL Nebulization Q4H PRN SimWilhelmina McardleD   3 mL at 11/08/17 0404  . ipratropium-albuterol (DUONEB) 0.5-2.5 (3) MG/3ML nebulizer solution 3 mL  3 mL Nebulization Q4H KasFlora LippsD   3 mL at 11/12/17 0735  . LORazepam (ATIVAN) injection 0.5-1 mg  0.5-1 mg Intravenous Q2H PRN SimWilhelmina McardleD   1 mg at 11/08/17 0532  . MEDLINE mouth rinse  15 mL Mouth Rinse 10 times per day TukMikael SprayP   15 mL at 11/12/17 0508  . metoprolol tartrate (LOPRESSOR) injection 2.5-5 mg  2.5-5 mg Intravenous Q3H PRN SimWilhelmina McardleD   5 mg at 11/10/17 1355  . multivitamin liquid 15 mL  15 mL Per Tube Daily RicLaf[place] DragonD   15 mL at 11/11/17 0900  . norepinephrine (LEVOPHED) 16 mg in dextrose 5 % 250 mL (0.064 mg/mL) infusion  0-40 mcg/min Intravenous Titrated WieLoletha GrayerD 46.9 mL/hr at 11/12/17 0800 50 mcg/min at 11/12/17 0800  . ondansetron (ZOFRAN) injection 4 mg  4 mg Intravenous Q6H PRN WilLance CoonD   4 mg at 11/06/17 0932  . pantoprazole sodium (PROTONIX) 40 mg/20 mL oral suspension 40 mg  40 mg Per Tube BID WieLoletha GrayerD   40 mg at 11/11/17 2129  . phenylephrine (NEO-SYNEPHRINE) 40 mg in sodium chloride 0.9 % 250 mL (0.16 mg/mL) infusion  0-400 mcg/min Intravenous Titrated SimCharlett NosePH      . pureflow IV solution for Dialysis   CRRT Continuous Sophia Sperry, MD      . senna-docusate (Senokot-S) tablet 2 tablet  2 tablet Per Tube BID KasFlora LippsD   2 tablet at 11/11/17 2115  . sennosides (SENOKOT) 8.8 MG/5ML syrup 5 mL  5 mL Per Tube BID PRN TukDorene Sorrow NP   5 mL at 11/06/17 2351  . thiamine (VITAMIN B-1) tablet 100 mg  100 mg Per Tube Daily RicLaf[place] DragonD   100 mg at 11/11/17 0900  . vasopressin  (PITRESSIN) 40 Units in sodium chloride 0.9 % 250 mL (0.16 Units/mL) infusion  0.04 Units/min Intravenous  Continuous Conforti, John, DO      . vecuronium (NORCURON) injection 10 mg  10 mg Intravenous Q1H PRN Awilda Bill, NP   10 mg at 11/11/17 1505      Allergies: Allergies  Allergen Reactions  . Lisinopril     Angioedema      Past Medical History: Past Medical History:  Diagnosis Date  . Arthritis   . Hypertension      Past Surgical History: Past Surgical History:  Procedure Laterality Date  . INTUBATION-ENDOTRACHEAL WITH TRACHEOSTOMY STANDBY N/A 11/09/2017   Procedure: INTUBATION-ENDOTRACHEAL;  Surgeon: Beverly Gust, MD;  Location: [facility] ORS;  Service: ENT;  Laterality: N/A;  . JOINT REPLACEMENT       Family History: History reviewed. No pertinent family history.   Social History: Social History   Socioeconomic History  . Marital status: Single    Spouse name: Not on file  . Number of children: Not on file  . Years of education: Not on file  . Highest education level: Not on file  Occupational History  . Not on file  Social Needs  . Financial resource strain: Not on file  . Food insecurity:    Worry: Not on file    Inability: Not on file  . Transportation needs:    Medical: Not on file    Non-medical: Not on file  Tobacco Use  . Smoking status: Never Smoker  . Smokeless tobacco: Never Used  Substance and Sexual Activity  . Alcohol use: Yes  . 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 Sexual activity: Not on file  Lifestyle  . Physical activity:    Days per week: Not on file    Minutes per session: Not on file  . Stress: Not on file  Relationships  . Social connections:    Talks on phone: Not on file    Gets together: Not on file    Attends religious service: Not on file    Active member of club or organization: Not on file    Attends meetings of clubs or organizations: Not on file    Relationship status: Not on file  .  Intimate partner violence:    Fear of current or ex partner: Not on file    Emotionally abused: Not on file    Physically abused: Not on file    Forced sexual activity: Not on file  Other Topics Concern  . Not on file  Social History Narrative  . Not on file     Review of Systems: Patient cannot provide review of systems as he is currently on the ventilator  Vital Signs: Blood pressure 132/74, pulse (!) 55, temperature 97.8 F (36.6 C), temperature source Oral, resp. rate (!) 24, height 6' 1"  (1.854 m), weight 96.5 kg (212 lb 11.9 oz), SpO2 100 %.  Weight trends: Filed Weights   11/10/17 0320 11/11/17 0407 11/12/17 0500  Weight: 90.9 kg (200 lb 6.4 oz) 91.1 kg (200 lb 13.4 oz) 96.5 kg (212 lb 11.9 oz)    Physical Exam: General: Critically ill appearing  Head: Lip swelling noted  Eyes: Anicteric, EOMI  Nose: Mucous membranes moist, not inflammed, nonerythematous.  Throat: ETT in place   Neck: Supple, trachea midline.  Lungs:  Scattered rhonchi, vent assisted  Heart: S1S2 no rubs  Abdomen:  BS normoactive. Soft, Nondistended, non-tender.  No masses or organomegaly.  Extremities: 1+ pretibial edema.  Neurologic: Intubated  Skin: No visible rashes, scars.  Lab results: Basic Metabolic Panel: Recent Labs  Lab 11/11/17 0430 11/11/17 1829 11/11/17 2127 11/12/17 0159 11/12/17 0556  NA 154* 153* 152* 147* 144  K 4.3 4.6 4.9 4.0 3.5  CL 120* 117* 116* 113* 115*  CO2 25 25 27 25 23   GLUCOSE 191* 164* 172* 142* 128*  BUN 139* 137* 129* 104* 90*  CREATININE 3.56* 4.15* 4.40* 3.13* 2.48*  CALCIUM 7.5* 7.6* 7.6* 7.2* 6.3*  MG 2.0 1.9 1.9 1.7 1.3*  PHOS 6.4* 7.5* 8.4* 6.1* 5.0*    Liver Function Tests: Recent Labs  Lab 11/08/17 0458  11/11/17 2127 11/12/17 0159 11/12/17 0556  AST 75*  --   --   --   --   ALT 75*  --   --   --   --   ALKPHOS 102  --   --   --   --   BILITOT 1.9*  --   --   --   --   PROT 5.5*  --   --   --   --   ALBUMIN 2.2*   < > 1.7* 1.4*  1.2*   < > = values in this interval not displayed.   No results for input(s): LIPASE, AMYLASE in the last 168 hours. Recent Labs  Lab 11/06/17 0426 11/07/17 1343  AMMONIA 29 32    CBC: Recent Labs  Lab 11/08/17 0458 11/09/17 0733 11/10/17 0503 11/12/17 0556  WBC 17.6* 24.8* 30.7* 14.1*  NEUTROABS 15.6* 23.1* 28.5*  --   HGB 11.5* 11.9* 11.6* 10.2*  HCT 32.9* 35.1* 34.6* 30.3*  MCV 95.5 96.6 94.7 96.3  PLT 187 219 252 182    Cardiac Enzymes: No results for input(s): CKTOTAL, CKMB, CKMBINDEX, TROPONINI in the last 168 hours.  BNP: Invalid input(s): POCBNP  CBG: Recent Labs  Lab 11/11/17 1536 11/11/17 1944 11/11/17 2339 11/12/17 0408 11/12/17 0800  GLUCAP 140* 150* 146* 107* 125*    Microbiology: Results for orders placed or performed during the hospital encounter of 11/21/2017  MRSA PCR Screening     Status: None   Collection Time: 10/29/2017 11:38 PM  Result Value Ref Range Status   MRSA by PCR NEGATIVE NEGATIVE Final    Comment:        The GeneXpert MRSA Assay (FDA approved for NASAL specimens only), is one component of a comprehensive MRSA colonization surveillance program. It is not intended to diagnose MRSA infection nor to guide or monitor treatment for MRSA infections. Performed at [facility], [address] [place], [place] [address]   Culture, respiratory (NON-Expectorated)     Status: None   Collection Time: 11/08/17  5:14 AM  Result Value Ref Range Status   Specimen Description   Final    TRACHEAL ASPIRATE Performed at [facility], [address] [place], [place] [address]    Special Requests   Final    NONE Performed at [facility], [address] [place], [place] [address]    Gram Stain   Final    FEW WBC PRESENT, PREDOMINANTLY PMN ABUNDANT GRAM POSITIVE COCCI ABUNDANT GRAM NEGATIVE RODS Performed at [facility], [place] [address] [place], [place] [address]    Culture ABUNDANT  STAPHYLOCOCCUS AUREUS  Final   Report Status 11/10/2017 FINAL  Final   Organism ID, Bacteria STAPHYLOCOCCUS AUREUS  Final      Susceptibility   Staphylococcus aureus - MIC*    CIPROFLOXACIN <=0.5 SENSITIVE Sensitive     ERYTHROMYCIN <=0.25 SENSITIVE  Sensitive     GENTAMICIN <=0.5 SENSITIVE Sensitive     OXACILLIN 0.5 SENSITIVE Sensitive     TETRACYCLINE <=1 SENSITIVE Sensitive     VANCOMYCIN 1 SENSITIVE Sensitive     TRIMETH/SULFA <=10 SENSITIVE Sensitive     CLINDAMYCIN <=0.25 SENSITIVE Sensitive     RIFAMPIN <=0.5 SENSITIVE Sensitive     Inducible Clindamycin NEGATIVE Sensitive     * ABUNDANT STAPHYLOCOCCUS AUREUS  Urine Culture     Status: Abnormal (Preliminary result)   Collection Time: 11/10/17  6:34 AM  Result Value Ref Range Status   Specimen Description   Final    URINE, RANDOM Performed at [facility], [address], [place], [place] [address]    Special Requests   Final    NONE Performed at [facility], [address], [place], [place] [address]    Culture (A)  Final    20,000 COLONIES/mL STAPHYLOCOCCUS SPECIES (COAGULASE NEGATIVE)   Report Status PENDING  Incomplete  CULTURE, BLOOD (ROUTINE X 2) w Reflex to ID Panel     Status: None (Preliminary result)   Collection Time: 11/10/17  8:15 AM  Result Value Ref Range Status   Specimen Description BLOOD BLOOD LEFT HAND  Final   Special Requests   Final    BOTTLES DRAWN AEROBIC AND ANAEROBIC Blood Culture adequate volume   Culture   Final    NO GROWTH 2 DAYS Performed at [facility], [address], [place], [place] [address]    Report Status PENDING  Incomplete  CULTURE, BLOOD (ROUTINE X 2) w Reflex to ID Panel     Status: None (Preliminary result)   Collection Time: 11/10/17  8:25 AM  Result Value Ref Range Status   Specimen Description BLOOD BLOOD RIGHT HAND  Final   Special Requests   Final    BOTTLES DRAWN AEROBIC AND ANAEROBIC Blood Culture adequate volume   Culture   Final     NO GROWTH 2 DAYS Performed at [facility], [address] [place], [place] [address]    Report Status PENDING  Incomplete  Culture, respiratory (NON-Expectorated)     Status: None (Preliminary result)   Collection Time: 11/10/17 11:13 AM  Result Value Ref Range Status   Specimen Description   Final    TRACHEAL ASPIRATE Performed at [facility], [address] [place], [place] [address]    Special Requests   Final    NONE Performed at [facility], [address] [place], [place] [address]    Gram Stain   Final    RARE WBC PRESENT, PREDOMINANTLY PMN RARE GRAM NEGATIVE COCCOBACILLI    Culture   Final    MODERATE STAPHYLOCOCCUS AUREUS SUSCEPTIBILITIES TO FOLLOW Performed at [facility], [place] [address], [place], [place] [address]    Report Status PENDING  Incomplete  Aerobic/Anaerobic Culture (surgical/deep wound)     Status: None (Preliminary result)   Collection Time: 11/11/17  6:01 PM  Result Value Ref Range Status   Specimen Description WOUND  Final   Special Requests   Final    Normal Performed at [facility], [address] [place], [place] [address]    Gram Stain   Final    ABUNDANT WBC PRESENT, PREDOMINANTLY PMN MODERATE GRAM POSITIVE COCCI RARE GRAM POSITIVE RODS    Culture PENDING  Incomplete   Report Status PENDING  Incomplete    Coagulation Studies: No results for input(s): LABPROT, INR in the last 72  hours.  Urinalysis: Recent Labs    11/10/17 0634  COLORURINE AMBER*  LABSPEC 1.014  PHURINE 5.0  GLUCOSEU 50*  HGBUR MODERATE*  BILIRUBINUR NEGATIVE  KETONESUR NEGATIVE  PROTEINUR 30*  NITRITE NEGATIVE  LEUKOCYTESUR NEGATIVE      Imaging: Dg Chest 1 View  Result Date: 11/11/2017 CLINICAL DATA:  Central line placement. EXAM: CHEST  1 VIEW COMPARISON:  Chest x-ray dated November 08, 2017. FINDINGS: Interval placement of a left internal jugular dialysis catheter with the tip in the distal SVC.  Unchanged positioning of the endotracheal and enteric tubes. Worsening right-sided pleural effusion with near complete opacification of the right hemithorax. There is some aeration within the right upper lobe. No mediastinal shift. Unchanged atelectasis at the left lung base. No definite pneumothorax. No acute osseous abnormality. IMPRESSION: 1. Worsening right-sided pleural effusion with near complete opacification of the right hemithorax. Recommend chest CT for further evaluation. 2. Interval placement of a left internal jugular dialysis catheter with the tip in the distal SVC. No definite pneumothorax. Electronically Signed   By: Titus Dubin M.D.   On: 11/11/2017 15:47   Ct Head Wo Contrast  Result Date: 11/11/2017 CLINICAL DATA:  Altered mental status. EXAM: CT HEAD WITHOUT CONTRAST TECHNIQUE: Contiguous axial images were obtained from the base of the skull through the vertex without intravenous contrast. COMPARISON:  None. FINDINGS: Brain: Hypodensity and loss of the normal gray-white matter differentiation within the left parafalcine parietal lobe, concerning for acute to subacute infarct. Additional focal hypodensity within the genu of left internal capsule. No hemorrhage, hydrocephalus, extra-axial collection or mass lesion/mass effect. Vascular: No hyperdense vessel or unexpected calcification. Skull: Normal. Negative for fracture or focal lesion. Sinuses/Orbits: Complete opacification of the left frontal, maxillary, and sphenoid sinuses, as well as the left ethmoid air cells. Partial opacification of the bilateral mastoid air cells. No acute orbital abnormality. Other: None. IMPRESSION: 1. Hypodensity within the left parafalcine parietal lobe, concerning for acute to subacute infarct. 2. 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 3. Extensive left-sided paranasal sinus disease. These results were called by telephone at the time of interpretation on 11/11/2017 at 9:52  am to Dr. Flora Lipps , who verbally acknowledged these results. Electronically Signed   By: Titus Dubin M.D.   On: 11/11/2017 09:53   Ct Chest Wo Contrast  Result Date: 11/11/2017 CLINICAL DATA:  62 year old male with opacified RIGHT hemithorax and respiratory failure. EXAM: CT CHEST WITHOUT CONTRAST TECHNIQUE: Multidetector CT imaging of the chest was performed following the standard protocol without IV contrast. COMPARISON:  11/11/2017 and prior radiographs FINDINGS: Cardiovascular: Mild cardiomegaly and coronary artery atherosclerotic calcifications noted. Aortic atherosclerotic calcifications identified without aneurysm. No pericardial effusion. Mediastinum/Nodes: Shotty mediastinal lymph nodes are present. An endotracheal tube is present with tip approximately 4 cm above the carina. A LEFT IJ central venous catheter is noted with tip in the mid SVC. An NG tube is noted with tip in the mid stomach. No mediastinal mass identified. Lungs/Pleura: A large possibly loculated RIGHT hydropneumothorax noted. Atelectasis of the majority of the RIGHT lung noted. Airspace disease within the lingula noted. Atelectasis/consolidation of the LEFT LOWER lobe noted. Upper Abdomen: No acute abnormality. Musculoskeletal: No acute or suspicious bony abnormalities. IMPRESSION: 1. Large possibly loculated RIGHT hydropneumothorax which may be related to infection or possibly malignancy. Associated compressive atelectasis of the majority of the RIGHT lung. Clinical team is aware of this per note. 2. Lingular airspace disease likely representing aspiration or pneumonia. LEFT  LOWER lobe atelectasis/consolidation. 3. Support apparatus as described 4. Mild cardiomegaly and coronary artery disease 5.  Aortic Atherosclerosis (ICD10-I70.0). Electronically Signed   By: Margarette Canada M.D.   On: 11/11/2017 17:28   Dg Chest Port 1 View  Result Date: 11/12/2017 CLINICAL DATA:  Respiratory failure EXAM: PORTABLE CHEST 1 VIEW COMPARISON:   November 11, 2017 chest radiograph and chest CT FINDINGS: Endotracheal tube tip is 4.8 cm above the carina. Central catheter tip is in superior vena cava. Nasogastric tube tip and side port are below the diaphragm. There is now a pigtail catheter in the right hemithorax. There has been significant drainage of right pleural effusion compared to 1 day prior. Moderate loculated effusion remains on the right. There is atelectatic change throughout the right mid and lower lung zones. There is focal consolidation in the medial left base with minimal left pleural effusion. Heart is upper normal in size with pulmonary vascularity within normal limits. No adenopathy. No bone lesions. IMPRESSION: Tube and catheter positions as described without pneumothorax. Pigtail catheter now present on the right with considerable resolution of pleural effusion on the right. Moderate partially loculated pleural effusion remains on the right. There is a minimal pleural effusion on the left. There is atelectatic change throughout right mid lower lung zones. Stable consolidation medial left base. Stable cardiac silhouette. Electronically Signed   By: Lowella Grip III M.D.   On: 11/12/2017 07:33   Ct Image Guided Fluid Drain By Catheter  Result Date: 11/12/2017 INDICATION: Sepsis and hydropneumothorax. Please perform CT-guided placement for infection source control purposes. EXAM: CT IMAGE GUIDED FLUID DRAIN BY CATHETER COMPARISON:  Chest CT - earlier same day MEDICATIONS: The patient is currently admitted to the hospital and receiving intravenous antibiotics. The antibiotics were administered within an appropriate time frame prior to the initiation of the procedure. ANESTHESIA/SEDATION: None, the patient is currently intubated and sedated CONTRAST:  None COMPLICATIONS: None immediate. PROCEDURE: Informed written consent was obtained from the patient's family after a discussion of the risks, benefits and alternatives to treatment. The  patient was placed supine on the CT gantry and a pre procedural CT was performed re-demonstrating the known moderate to large sized right-sided hydropneumothorax. The procedure was planned. A timeout was performed prior to the initiation of the procedure. The skin overlying the right anterior upper chest was prepped and draped in the usual sterile fashion. The overlying soft tissues were anesthetized with 1% lidocaine with epinephrine. Appropriate trajectory was planned with the use of a 22 gauge spinal needle. An 18 gauge trocar needle was advanced into the right pleural space and a short Amplatz super stiff wire was coiled within the collection. Appropriate positioning was confirmed with a limited CT scan. The tract was serially dilated allowing placement of a 12 [place] all-purpose drainage catheter. Appropriate positioning was confirmed with a limited postprocedural CT scan. Approximately 1.8 L of purulent fluid was aspirated. The tube was connected to a pleural vac device and sutured in place. A dressing was placed. Postprocedural imaging demonstrates significant reduction in size of right-sided hydropneumothorax with partial re-expansion of the right lung. The patient tolerated the procedure well without immediate post procedural complication. IMPRESSION: Successful CT guided placement of a 38 French all purpose drain catheter into the right pleural space with aspiration of 1.8 L of purulent fluid. Samples were sent to the laboratory as requested by the ordering clinical team. Electronically Signed   By: Sandi Mariscal M.D.   On: 11/12/2017 07:38  Assessment & Plan: Pt is a 62 y.o. male with a PMHx of osteoarthritis, hypertension, GERD who was admitted to [facility] on 11/11/2017 for evaluation of angioedema.  Patient was previously on lisinopril.   1.  Acute renal failure, secondary to hypotension. 2.  Acute respiratory failure. 3.  Angioedema. 4.  Hypotension. 5.  Right sided empyema.   Plan: We were  asked to see the patient for evaluation management of acute renal failure.  He has been started on CRRT.  His temporary dialysis catheter was clotted earlier this a.m.  TPA is being dwelled.  Hopefully this will reestablish catheter patency.  If catheter is still not working we may need to replace the temporary dialysis catheter.  Otherwise patient remains critically ill at the moment.

## 2017-11-12 NOTE — Progress Notes (Signed)
Subjective: Patient remains unresponsive on Fentanyl.    Objective: Current vital signs: BP 113/66 (BP Location: Right Arm)   Pulse 67   Temp 98.2 F (36.8 C) (Oral)   Resp 19   Ht 6' 1"  (1.854 m)   Wt 96.5 kg (212 lb 11.9 oz)   SpO2 100%   BMI 28.07 kg/m  Vital signs in last 24 hours: Temp:  [97.8 F (36.6 C)-100.8 F (38.2 C)] 98.2 F (36.8 C) (04/19 1221) Pulse Rate:  [25-147] 67 (04/19 1221) Resp:  [14-24] 19 (04/19 1221) BP: (63-133)/(43-75) 113/66 (04/19 1200) SpO2:  [82 %-100 %] 100 % (04/19 1221) FiO2 (%):  [65 %-100 %] 65 % (04/19 1200) Weight:  [96.5 kg (212 lb 11.9 oz)] 96.5 kg (212 lb 11.9 oz) (04/19 0500)  Intake/Output from previous day: 04/18 0701 - 04/19 0700 In: 3272.7 [I.V.:2752.7; NG/GT:470; IV Piggyback:50] Out: 86 [Urine:620; Emesis/NG output:15; Chest Tube:370] Intake/Output this shift: Total I/O In: 1153.9 [I.V.:544.7; NG/GT:559.2; IV Piggyback:50] Out: 10 [Urine:37; Chest Tube:50] Nutritional status: No diet orders on file  Neurologic Exam: Mental Status: Patient does not respond to verbal stimuli.  Does not respond to deep sternal rub.  Does not follow commands.  No verbalizations noted.  Cranial Nerves: II: patient does not respond confrontation bilaterally, pupils right 2 mm, left 3 mm,and unreactive bilaterally III,IV,VI: doll's response absent bilaterally.  V,VII: corneal reflex present bilaterally  VIII: patient does not respond to verbal stimuli IX,X: gag reflex reduced, XI: trapezius strength unable to test bilaterally XII: tongue strength unable to test Motor: Extremities flaccid throughout.  No spontaneous movement noted.  No purposeful movements noted. Sensory: Does not respond to noxious stimuli in any extremity. Deep Tendon Reflexes:  1+ in the upper extremities and absent in the lower extremities.  Lab Results: Basic Metabolic Panel: Recent Labs  Lab 11/11/17 1829 11/11/17 2127 11/12/17 0159 11/12/17 0556  11/12/17 0949  NA 153* 152* 147* 144 133*  K 4.6 4.9 4.0 3.5 3.8  CL 117* 116* 113* 115* 98*  CO2 25 27 25 23 27   GLUCOSE 164* 172* 142* 128* 193*  BUN 137* 129* 104* 90* 16  CREATININE 4.15* 4.40* 3.13* 2.48* 1.59*  CALCIUM 7.6* 7.6* 7.2* 6.3* 10.5*  MG 1.9 1.9 1.7 1.3* 1.8  PHOS 7.5* 8.4* 6.1* 5.0* 3.1    Liver Function Tests: Recent Labs  Lab 11/08/17 0458 11/11/17 1829 11/11/17 2127 11/12/17 0159 11/12/17 0556 11/12/17 0949  AST 75*  --   --   --   --   --   ALT 75*  --   --   --   --   --   ALKPHOS 102  --   --   --   --   --   BILITOT 1.9*  --   --   --   --   --   PROT 5.5*  --   --   --   --   --   ALBUMIN 2.2* 1.7* 1.7* 1.4* 1.2* 2.2*   No results for input(s): LIPASE, AMYLASE in the last 168 hours. Recent Labs  Lab 11/06/17 0426 11/07/17 1343  AMMONIA 29 32    CBC: Recent Labs  Lab 11/08/17 0458 11/09/17 0733 11/10/17 0503 11/12/17 0556  WBC 17.6* 24.8* 30.7* 14.1*  NEUTROABS 15.6* 23.1* 28.5*  --   HGB 11.5* 11.9* 11.6* 10.2*  HCT 32.9* 35.1* 34.6* 30.3*  MCV 95.5 96.6 94.7 96.3  PLT 187 219 252 182    Cardiac Enzymes:  No results for input(s): CKTOTAL, CKMB, CKMBINDEX, TROPONINI in the last 168 hours.  Lipid Panel: Recent Labs  Lab 11/07/17 1818 11/10/17 1709  TRIG 363* 299*    CBG: Recent Labs  Lab 11/11/17 1944 11/11/17 2339 11/12/17 0408 11/12/17 0800 11/12/17 1208  GLUCAP 150* 146* 107* 125* 118*    Microbiology: Results for orders placed or performed during the hospital encounter of 10/26/2017  MRSA PCR Screening     Status: None   Collection Time: 11/08/2017 11:38 PM  Result Value Ref Range Status   MRSA by PCR NEGATIVE NEGATIVE Final    Comment:        The GeneXpert MRSA Assay (FDA approved for NASAL specimens only), is one component of a comprehensive MRSA colonization surveillance program. It is not intended to diagnose MRSA infection nor to guide or monitor treatment for MRSA infections. Performed at [facility], [address] [place], [place] [address]   Culture, respiratory (NON-Expectorated)     Status: None   Collection Time: 11/08/17  5:14 AM  Result Value Ref Range Status   Specimen Description   Final    TRACHEAL ASPIRATE Performed at [facility], [address], [place], [place] [address]    Special Requests   Final    NONE Performed at [facility], [address] [place], [place] [address]    Gram Stain   Final    FEW WBC PRESENT, PREDOMINANTLY PMN ABUNDANT GRAM POSITIVE COCCI ABUNDANT GRAM NEGATIVE RODS Performed at [facility], [place] [address] [place], [place] [address]    Culture ABUNDANT STAPHYLOCOCCUS AUREUS  Final   Report Status 11/10/2017 FINAL  Final   Organism ID, Bacteria STAPHYLOCOCCUS AUREUS  Final      Susceptibility   Staphylococcus aureus - MIC*    CIPROFLOXACIN <=0.5 SENSITIVE Sensitive     ERYTHROMYCIN <=0.25 SENSITIVE Sensitive     GENTAMICIN <=0.5 SENSITIVE Sensitive     OXACILLIN 0.5 SENSITIVE Sensitive     TETRACYCLINE <=1 SENSITIVE Sensitive     VANCOMYCIN 1 SENSITIVE Sensitive     TRIMETH/SULFA <=10 SENSITIVE Sensitive     CLINDAMYCIN <=0.25 SENSITIVE Sensitive     RIFAMPIN <=0.5 SENSITIVE Sensitive     Inducible Clindamycin NEGATIVE Sensitive     * ABUNDANT STAPHYLOCOCCUS AUREUS  Urine Culture     Status: Abnormal   Collection Time: 11/10/17  6:34 AM  Result Value Ref Range Status   Specimen Description   Final    URINE, RANDOM Performed at [facility], [address], [place], [place] [address]    Special Requests   Final    NONE Performed at [facility], [address] [place],  [address]    Culture (A)  Final    20,000 COLONIES/mL STAPHYLOCOCCUS SPECIES (COAGULASE NEGATIVE)   Report Status 11/12/2017 FINAL  Final   Organism ID, Bacteria STAPHYLOCOCCUS SPECIES (COAGULASE NEGATIVE) (A)  Final      Susceptibility   Staphylococcus species (coagulase negative) - MIC*     CIPROFLOXACIN <=0.5 SENSITIVE Sensitive     GENTAMICIN <=0.5 SENSITIVE Sensitive     NITROFURANTOIN <=16 SENSITIVE Sensitive     OXACILLIN <=0.25 SENSITIVE Sensitive     TETRACYCLINE <=1 SENSITIVE Sensitive     VANCOMYCIN <=0.5 SENSITIVE Sensitive     TRIMETH/SULFA <=10 SENSITIVE Sensitive     CLINDAMYCIN 0.5 SENSITIVE Sensitive     RIFAMPIN <=0.5 SENSITIVE Sensitive     Inducible Clindamycin NEGATIVE Sensitive     *  20,000 COLONIES/mL STAPHYLOCOCCUS SPECIES (COAGULASE NEGATIVE)  CULTURE, BLOOD (ROUTINE X 2) w Reflex to ID Panel     Status: None (Preliminary result)   Collection Time: 11/10/17  8:15 AM  Result Value Ref Range Status   Specimen Description BLOOD BLOOD LEFT HAND  Final   Special Requests   Final    BOTTLES DRAWN AEROBIC AND ANAEROBIC Blood Culture adequate volume   Culture   Final    NO GROWTH 2 DAYS Performed at [facility], [address], [place], [place] [address]    Report Status PENDING  Incomplete  CULTURE, BLOOD (ROUTINE X 2) w Reflex to ID Panel     Status: None (Preliminary result)   Collection Time: 11/10/17  8:25 AM  Result Value Ref Range Status   Specimen Description BLOOD BLOOD RIGHT HAND  Final   Special Requests   Final    BOTTLES DRAWN AEROBIC AND ANAEROBIC Blood Culture adequate volume   Culture   Final    NO GROWTH 2 DAYS Performed at [facility], [address], [place], [place] [address]    Report Status PENDING  Incomplete  Culture, respiratory (NON-Expectorated)     Status: None   Collection Time: 11/10/17 11:13 AM  Result Value Ref Range Status   Specimen Description   Final    TRACHEAL ASPIRATE Performed at [facility], [address] [place], [place] [address]    Special Requests   Final    NONE Performed at [facility], [address], [place], [place] [address]    Gram Stain   Final    RARE WBC PRESENT, PREDOMINANTLY PMN RARE GRAM NEGATIVE COCCOBACILLI Performed at [facility], [place] [address], [place], [place] [address]    Culture MODERATE STAPHYLOCOCCUS AUREUS  Final   Report Status 11/12/2017 FINAL  Final   Organism ID, Bacteria STAPHYLOCOCCUS AUREUS  Final      Susceptibility   Staphylococcus aureus - MIC*    CIPROFLOXACIN <=0.5 SENSITIVE Sensitive     ERYTHROMYCIN <=0.25 SENSITIVE Sensitive     GENTAMICIN <=0.5 SENSITIVE Sensitive     OXACILLIN 0.5 SENSITIVE Sensitive     TETRACYCLINE <=1 SENSITIVE Sensitive     VANCOMYCIN <=0.5 SENSITIVE Sensitive     TRIMETH/SULFA <=10 SENSITIVE Sensitive     CLINDAMYCIN <=0.25 SENSITIVE Sensitive     RIFAMPIN <=0.5 SENSITIVE Sensitive     Inducible Clindamycin NEGATIVE Sensitive     * MODERATE STAPHYLOCOCCUS AUREUS  Aerobic/Anaerobic Culture (surgical/deep wound)     Status: None (Preliminary result)   Collection Time: 11/11/17  6:01 PM  Result Value Ref Range Status   Specimen Description WOUND  Final   Special Requests   Final    Normal Performed at [facility], [address] [place], [place] [address]    Gram Stain   Final    ABUNDANT WBC PRESENT, PREDOMINANTLY PMN MODERATE GRAM POSITIVE COCCI RARE GRAM POSITIVE RODS    Culture PENDING  Incomplete   Report Status PENDING  Incomplete    Coagulation Studies: No results for input(s): LABPROT, INR in the last 72 hours.  Imaging: Dg Chest 1 View  Result Date: 11/11/2017 CLINICAL DATA:  Central line placement. EXAM: CHEST  1 VIEW COMPARISON:  Chest x-ray dated November 08, 2017. FINDINGS: Interval placement of a left internal jugular dialysis catheter with the tip in the distal SVC. Unchanged positioning of the endotracheal and enteric tubes. Worsening right-sided pleural effusion with near complete opacification of  the right hemithorax. There is some aeration within the right upper lobe. No mediastinal shift. Unchanged atelectasis at the left lung base. No definite pneumothorax. No acute osseous abnormality. IMPRESSION: 1. Worsening  right-sided pleural effusion with near complete opacification of the right hemithorax. Recommend chest CT for further evaluation. 2. Interval placement of a left internal jugular dialysis catheter with the tip in the distal SVC. No definite pneumothorax. Electronically Signed   By: Titus Dubin M.D.   On: 11/11/2017 15:47   Ct Head Wo Contrast  Result Date: 11/11/2017 CLINICAL DATA:  Altered mental status. EXAM: CT HEAD WITHOUT CONTRAST TECHNIQUE: Contiguous axial images were obtained from the base of the skull through the vertex without intravenous contrast. COMPARISON:  None. FINDINGS: Brain: Hypodensity and loss of the normal gray-white matter differentiation within the left parafalcine parietal lobe, concerning for acute to subacute infarct. Additional focal hypodensity within the genu of left internal capsule. No hemorrhage, hydrocephalus, extra-axial collection or mass lesion/mass effect. Vascular: No hyperdense vessel or unexpected calcification. Skull: Normal. Negative for fracture or focal lesion. Sinuses/Orbits: Complete opacification of the left frontal, maxillary, and sphenoid sinuses, as well as the left ethmoid air cells. Partial opacification of the bilateral mastoid air cells. No acute orbital abnormality. Other: None. IMPRESSION: 1. Hypodensity within the left parafalcine parietal lobe, concerning for acute to subacute infarct. 2. 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 3. Extensive left-sided paranasal sinus disease. These results were called by telephone at the time of interpretation on 11/11/2017 at 9:52 am to Dr. Flora Lipps , who verbally acknowledged these results. Electronically Signed   By: Titus Dubin M.D.   On: 11/11/2017 09:53   Ct Chest Wo Contrast  Result Date: 11/11/2017 CLINICAL DATA:  62 year old male with opacified RIGHT hemithorax and respiratory failure. EXAM: CT CHEST WITHOUT CONTRAST TECHNIQUE: Multidetector CT imaging of the  chest was performed following the standard protocol without IV contrast. COMPARISON:  11/11/2017 and prior radiographs FINDINGS: Cardiovascular: Mild cardiomegaly and coronary artery atherosclerotic calcifications noted. Aortic atherosclerotic calcifications identified without aneurysm. No pericardial effusion. Mediastinum/Nodes: Shotty mediastinal lymph nodes are present. An endotracheal tube is present with tip approximately 4 cm above the carina. A LEFT IJ central venous catheter is noted with tip in the mid SVC. An NG tube is noted with tip in the mid stomach. No mediastinal mass identified. Lungs/Pleura: A large possibly loculated RIGHT hydropneumothorax noted. Atelectasis of the majority of the RIGHT lung noted. Airspace disease within the lingula noted. Atelectasis/consolidation of the LEFT LOWER lobe noted. Upper Abdomen: No acute abnormality. Musculoskeletal: No acute or suspicious bony abnormalities. IMPRESSION: 1. Large possibly loculated RIGHT hydropneumothorax which may be related to infection or possibly malignancy. Associated compressive atelectasis of the majority of the RIGHT lung. Clinical team is aware of this per note. 2. Lingular airspace disease likely representing aspiration or pneumonia. LEFT LOWER lobe atelectasis/consolidation. 3. Support apparatus as described 4. Mild cardiomegaly and coronary artery disease 5.  Aortic Atherosclerosis (ICD10-I70.0). Electronically Signed   By: Margarette Canada M.D.   On: 11/11/2017 17:28   Dg Chest Port 1 View  Result Date: 11/12/2017 CLINICAL DATA:  Respiratory failure EXAM: PORTABLE CHEST 1 VIEW COMPARISON:  November 11, 2017 chest radiograph and chest CT FINDINGS: Endotracheal tube tip is 4.8 cm above the carina. Central catheter tip is in superior vena cava. Nasogastric tube tip and side port are below the diaphragm. There is now a pigtail catheter in the right hemithorax. There has been significant  drainage of right pleural effusion compared to 1 day  prior. Moderate loculated effusion remains on the right. There is atelectatic change throughout the right mid and lower lung zones. There is focal consolidation in the medial left base with minimal left pleural effusion. Heart is upper normal in size with pulmonary vascularity within normal limits. No adenopathy. No bone lesions. IMPRESSION: Tube and catheter positions as described without pneumothorax. Pigtail catheter now present on the right with considerable resolution of pleural effusion on the right. Moderate partially loculated pleural effusion remains on the right. There is a minimal pleural effusion on the left. There is atelectatic change throughout right mid lower lung zones. Stable consolidation medial left base. Stable cardiac silhouette. Electronically Signed   By: Lowella Grip III M.D.   On: 11/12/2017 07:33   Ct Image Guided Fluid Drain By Catheter  Result Date: 11/12/2017 INDICATION: Sepsis and hydropneumothorax. Please perform CT-guided placement for infection source control purposes. EXAM: CT IMAGE GUIDED FLUID DRAIN BY CATHETER COMPARISON:  Chest CT - earlier same day MEDICATIONS: The patient is currently admitted to the hospital and receiving intravenous antibiotics. The antibiotics were administered within an appropriate time frame prior to the initiation of the procedure. ANESTHESIA/SEDATION: None, the patient is currently intubated and sedated CONTRAST:  None COMPLICATIONS: None immediate. PROCEDURE: Informed written consent was obtained from the patient's family after a discussion of the risks, benefits and alternatives to treatment. The patient was placed supine on the CT gantry and a pre procedural CT was performed re-demonstrating the known moderate to large sized right-sided hydropneumothorax. The procedure was planned. A timeout was performed prior to the initiation of the procedure. The skin overlying the right anterior upper chest was prepped and draped in the usual sterile  fashion. The overlying soft tissues were anesthetized with 1% lidocaine with epinephrine. Appropriate trajectory was planned with the use of a 22 gauge spinal needle. An 18 gauge trocar needle was advanced into the right pleural space and a short Amplatz super stiff wire was coiled within the collection. Appropriate positioning was confirmed with a limited CT scan. The tract was serially dilated allowing placement of a 12 [place] all-purpose drainage catheter. Appropriate positioning was confirmed with a limited postprocedural CT scan. Approximately 1.8 L of purulent fluid was aspirated. The tube was connected to a pleural vac device and sutured in place. A dressing was placed. Postprocedural imaging demonstrates significant reduction in size of right-sided hydropneumothorax with partial re-expansion of the right lung. The patient tolerated the procedure well without immediate post procedural complication. IMPRESSION: Successful CT guided placement of a 59 French all purpose drain catheter into the right pleural space with aspiration of 1.8 L of purulent fluid. Samples were sent to the laboratory as requested by the ordering clinical team. Electronically Signed   By: Sandi Mariscal M.D.   On: 11/12/2017 07:38    Medications:  I have reviewed the patient's current medications. Scheduled: . budesonide (PULMICORT) nebulizer solution  0.25 mg Nebulization BID  . chlorhexidine gluconate (MEDLINE KIT)  15 mL Mouth Rinse BID  . feeding supplement (PRO-STAT SUGAR FREE 64)  30 mL Per Tube BID  . folic acid  1 mg Per Tube Daily  . free water  250 mL Per Tube Q4H  . heparin injection (subcutaneous)  5,000 Units Subcutaneous Q8H  . insulin aspart  0-9 Units Subcutaneous Q4H  . ipratropium-albuterol  3 mL Nebulization Q4H  . mouth rinse  15 mL Mouth Rinse 10 times per day  .  multivitamin  15 mL Per Tube Daily  . pantoprazole sodium  40 mg Per Tube BID  . senna-docusate  2 tablet Per Tube BID  . thiamine  100 mg Per  Tube Daily    Assessment/Plan: Patient remains unresponsive.  Received CRRT overnight.  Renal function some improved.  Will continue to follow.     LOS: 12 days   Alexis Goodell, MD Neurology [phone] 11/12/2017  1:30 PM

## 2017-11-12 NOTE — Progress Notes (Signed)
Patient ID: Dustin Orozco, male   DOB: Aug 02, 1955, 62 y.o.   MRN: [id number] Pulmonary/critical care attending  Procedure note Central line placement Indication: Additional access required for multiple medications Right femoral location chosen secondary to a multitude of lines and devices in other locations Performed under ultrasound guidance Complete contact barrier precautions utilized The right femoral vein was cannulated. Guidewire place needle removed Using the seldinger technique a triple lumen catheter was placed The guidewire was removed All three ports had dark non pulsitile venous return. All ports flushed Sutured into place Dressed by nurse. Tolerated well  [name], DO

## 2017-11-12 NOTE — Progress Notes (Signed)
Pharmacy Antibiotic Note  Dustin Orozco is a 62 y.o. male admitted on 11/02/2017 with angioedema. Patient currently requiring mechanical ventilation and being treated for MSSA pneumonia.  Pharmacy has been consulted for Cefazolin dosing. Patient initiated on CRRT on 4/18.   Plan: Continue cefazolin 2g IV Q8hr.   Height: 6\' 1"  (185.4 cm) Weight: 212 lb 11.9 oz (96.5 kg) IBW/kg (Calculated) : 79.9  Temp (24hrs), Avg:98.8 F (37.1 C), Min:97.8 F (36.6 C), Max:100.8 F (38.2 C)  Recent Labs  Lab 11/08/17 0458 11/09/17 0733 11/10/17 0503  11/11/17 2127 11/12/17 0159 11/12/17 0556 11/12/17 0949 11/12/17 1707  WBC 17.6* 24.8* 30.7*  --   --   --  14.1*  --   --   CREATININE 2.66* 3.63* 3.36*   < > 4.40* 3.13* 2.48* 1.59* 3.63*   < > = values in this interval not displayed.    Estimated Creatinine Clearance: 25.5 mL/min (A) (by C-G formula based on SCr of 3.63 mg/dL (H)).    Allergies  Allergen Reactions  . Lisinopril     Angioedema    Antimicrobials this admission: Cefepime 4/14 >> 4/15 Unasyn 4/16  >> 4/17 Cefazolin 4/17 >>  Dose adjustments this admission: 4/15 Cefepime transitioned from 500mg  to 1000mg    Microbiology results: 4/17 Tracheal Aspirate: MSSA 4/17 Urine Cx: > 20K Staph Species  4/15 Sputum: MSSA 4/7  MRSA PCR: negative   Thank you for allowing pharmacy to be a part of this patient's care.  Jimena Wieczorek L 11/12/2017 7:52 PM

## 2017-11-12 NOTE — Progress Notes (Signed)
Spoke with Dr. Cherylann RatelLateef on the phone about patient's dialysis catheter access and frequent clotting today. Second round of tPA currently indwelling. MD ordered that if after this round of tPA, the catheter didn't work then to indwell tPA overnight and try cathteter again in the morning.

## 2017-11-12 NOTE — Progress Notes (Signed)
eLink Physician-Brief Progress Note Patient Name: Dustin Orozco DOB: 29-Nov-1955 MRN: [id number]   Date of Service  11/12/2017  HPI/Events of Note  Hypercapnia and respiratory acidosis  eICU Interventions  Increased respiratory rate on the ventilator to 24     Intervention Category Major Interventions: Acid-Base disturbance - evaluation and management  Migdalia DkOkoronkwo U Ogan 11/12/2017, 5:41 AM

## 2017-11-12 NOTE — Progress Notes (Signed)
PULMONARY / CRITICAL CARE MEDICINE   Name: Dustin Orozco MRN: [id number] DOB: 1956-03-13    ADMISSION DATE:  Nov 27, 2017  PT PROFILE:   32 M on chronic ACE inhibitor therapy, nasally intubated intubated by ENT in ED for angioedema with severe macroglossia and lip swelling.  MAJOR EVENTS/TEST RESULTS: 04/08 developed anuric renal failure 04/08 Renal US: no hydro, bladder decompressed 04/09 Renal consultation: "Acute renal failure is likely secondary to severe ATN likely from hypotension.  Renal ultrasound is negative for obstruction" 04/10 Extubated. Tolerated well but requiring [place] O2 @ 6 LPM 04/11 tolerating extubation well.  However, increasing agitated delirium.  Wife reports heavy chronic alcohol use.  Lorazepam initiated.  Persistent agitation.  Dexmedetomidine infusion initiated. 04/12 BIPAP initiated las night 04/14 Intubated 4/15 remains intubated 4/17 remains delerious and intubated 4/18 +CVA +hydroPTX s/p chest tube, started CRRT  INDWELLING DEVICES:: ETT (L naris) 04/07 >> 04/10 Left VAsc cath 4/18 Chest tube 4/18  MICRO DATA: MRSA PCR 04/07 >> NEG   Anti-infectives (From admission, onward)   Start     Dose/Rate Route Frequency Ordered Stop   11/11/17 2200  ceFAZolin (ANCEF) IVPB 2g/100 mL premix     2 g 200 mL/hr over 30 Minutes Intravenous Every 8 hours 11/11/17 1725     11/10/17 1245  ceFAZolin (ANCEF) IVPB 1 g/50 mL premix  Status:  Discontinued     1 g 100 mL/hr over 30 Minutes Intravenous Every 12 hours 11/10/17 1237 11/11/17 1725   11/09/17 2200  Ampicillin-Sulbactam (UNASYN) 3 g in sodium chloride 0.9 % 100 mL IVPB  Status:  Discontinued     3 g 200 mL/hr over 30 Minutes Intravenous Every 12 hours 11/09/17 1649 11/10/17 1004   11/09/17 1115  Ampicillin-Sulbactam (UNASYN) 3 g in sodium chloride 0.9 % 100 mL IVPB  Status:  Discontinued     3 g 200 mL/hr over 30 Minutes Intravenous Every 12 hours 11/09/17 1112 11/09/17 1649   11/08/17 2200  ceFEPIme (MAXIPIME) 1  g in sodium chloride 0.9 % 100 mL IVPB  Status:  Discontinued     1 g 200 mL/hr over 30 Minutes Intravenous Every 24 hours 11/08/17 1551 11/09/17 1005   11/07/17 2200  ceFEPIme (MAXIPIME) 500 mg in dextrose 5 % 50 mL IVPB  Status:  Discontinued     500 mg 100 mL/hr over 30 Minutes Intravenous Every 24 hours 11/07/17 1808 11/08/17 1551      SUBJECTIVE:  Severe resp failure On vent  remains critically ill +STAPH pneumonia CT head +CVA CT chest +hydropneumothorax, lung abscess/empyema Chest tube in place On vasopressors and CRRT      VITAL SIGNS: BP (!) 89/62   Pulse (!) 113   Temp 97.8 F (36.6 C) (Oral)   Resp 16   Ht 6\' 1"  (1.854 m)   Wt 212 lb 11.9 oz (96.5 kg)   SpO2 98%   BMI 28.07 kg/m       VENTILATOR SETTINGS: Vent Mode: PRVC FiO2 (%):  [70 %-100 %] 75 % Set Rate:  [16 bmp-24 bmp] 24 bmp Vt Set:  [500 mL] 500 mL PEEP:  [5 cmH20-8 cmH20] 8 cmH20 BIPAP for sleep  INTAKE / OUTPUT: I/O last 3 completed shifts: In: 4267.2 [I.V.:2168.1; NG/GT:1820; IV Piggyback:279] Out: 1510 [Urine:1510]  PHYSICAL EXAMINATION: General:  on vent GCS<8T Neuro: No focal neurological deficits, confused, agitated HEENT: NCAT, sclerae white Cardiovascular: RRR, no M Lungs: Rhonchi on right, clear on left +chest tube RT side Abdomen: Soft, NT, NABS Extremities:  Warm, no edema  LABS:  BMET Recent Labs  Lab 11/11/17 1829 11/11/17 2127 11/12/17 0159  NA 153* 152* 147*  K 4.6 4.9 4.0  CL 117* 116* 113*  CO2 25 27 25   BUN 137* 129* 104*  CREATININE 4.15* 4.40* 3.13*  GLUCOSE 164* 172* 142*    Electrolytes Recent Labs  Lab 11/11/17 1829 11/11/17 2127 11/12/17 0159  CALCIUM 7.6* 7.6* 7.2*  MG 1.9 1.9 1.7  PHOS 7.5* 8.4* 6.1*    CBC Recent Labs  Lab 11/08/17 0458 11/09/17 0733 11/10/17 0503  WBC 17.6* 24.8* 30.7*  HGB 11.5* 11.9* 11.6*  HCT 32.9* 35.1* 34.6*  PLT 187 219 252    Coag's No results for input(s): APTT, INR in the last 168  hours.  Sepsis Markers Recent Labs  Lab 11/10/17 0825  PROCALCITON 4.97    ABG Recent Labs  Lab 11/08/17 0508 11/08/17 1908 11/12/17 0455  PHART 7.42 7.40 7.12*  PCO2ART 34 34 84*  PO2ART 73* 63* 99    Liver Enzymes Recent Labs  Lab 11/08/17 0458 11/11/17 1829 11/11/17 2127 11/12/17 0159  AST 75*  --   --   --   ALT 75*  --   --   --   ALKPHOS 102  --   --   --   BILITOT 1.9*  --   --   --   ALBUMIN 2.2* 1.7* 1.7* 1.4*    Cardiac Enzymes No results for input(s): TROPONINI, PROBNP in the last 168 hours.  Glucose Recent Labs  Lab 11/11/17 0735 11/11/17 1128 11/11/17 1536 11/11/17 1944 11/11/17 2339 11/12/17 0408  GLUCAP 176* 155* 140* 150* 146* 107*    CXR: Mild vascular congestion, mild RLL atelectasis ASSESSMENT / PLAN: 62 yo AAM admitted for severe angioedema s/p extubation and re-intubated for severe DT's with encephalopathy with severe resp failure on vent complicated by STAPH pneumonia with persistent comatosed state complicated by acute CVA with with progressive renal failure with RT sided lung abscess/empyema with chest tube, severe hypoxia   PULMONARY A: Acute hypoxic Respiratory failure secondary to airway compromise from DTs STAPH pneumonia lung abscess/empyema Angioedema-resolved P:   Continue supplemental oxygen as needed to maintain SPO2 >90% Continue nebulized bronchodilators as needed ABX as listed above Vent support   CARDIOVASCULAR A: septic shock Vasopressors as needed keep MAP>65 Iv ABX   RENAL A:   AKI,  Hypernatremia, hyperchloremic azotemia P:   Monitor BMET intermittently Monitor I/Os Added free water, D5 started Electrolytes replacement vasc cath placed on CRRT  GASTROINTESTINAL A:   Dysphagia due to AMS P:   SUP: N/I postextubation NGT for enteral nutrition  INFECTIOUS STAPH PNEUMONIA CEFAZOLIN RT sided lung abscess/empyema  ENDOCRINE A:   Mild hyperglycemia without prior history of DM P:    CBGs q 8 hrs Consider SSI for glucose >180  NEUROLOGIC A:   History of heavy alcohol use Alcohol withdrawals with Delirium Tremens Continue thiamine, Folic acid and MVI. Has failed multiple SAT/SBT's +CVA Follow up neuro recs   Critical Care Time devoted to patient care services described in this note is 34 minutes.   Overall, patient is critically ill, prognosis is guarded.  Patient with Multiorgan failure and at high risk for cardiac arrest and death.    Lucie LeatherKurian David Aylana Hirschfeld, M.D.  [facility]  Medical Director [facility] Medical Director [facility]

## 2017-11-13 ENCOUNTER — Inpatient Hospital Stay: Payer: Medicare Other

## 2017-11-13 DIAGNOSIS — R4182 Altered mental status, unspecified: Secondary | ICD-10-CM

## 2017-11-13 LAB — RENAL FUNCTION PANEL
ALBUMIN: 1.4 g/dL — AB (ref 3.5–5.0)
ALBUMIN: 1.3 g/dL — AB (ref 3.5–5.0)
ALBUMIN: 1.3 g/dL — AB (ref 3.5–5.0)
ANION GAP: 8 (ref 5–15)
Albumin: 1.3 g/dL — ABNORMAL LOW (ref 3.5–5.0)
Albumin: 1.4 g/dL — ABNORMAL LOW (ref 3.5–5.0)
Albumin: 1.4 g/dL — ABNORMAL LOW (ref 3.5–5.0)
Anion gap: 8 (ref 5–15)
Anion gap: 7 (ref 5–15)
Anion gap: 6 (ref 5–15)
Anion gap: 6 (ref 5–15)
Anion gap: 5 (ref 5–15)
BUN: 59 mg/dL — AB (ref 6–20)
BUN: 65 mg/dL — ABNORMAL HIGH (ref 6–20)
BUN: 72 mg/dL — AB (ref 6–20)
BUN: 73 mg/dL — ABNORMAL HIGH (ref 6–20)
BUN: 81 mg/dL — ABNORMAL HIGH (ref 6–20)
BUN: 97 mg/dL — ABNORMAL HIGH (ref 6–20)
CALCIUM: 7.5 mg/dL — AB (ref 8.9–10.3)
CALCIUM: 7.5 mg/dL — AB (ref 8.9–10.3)
CHLORIDE: 103 mmol/L (ref 101–111)
CHLORIDE: 103 mmol/L (ref 101–111)
CHLORIDE: 105 mmol/L (ref 101–111)
CHLORIDE: 105 mmol/L (ref 101–111)
CO2: 25 mmol/L (ref 22–32)
CO2: 25 mmol/L (ref 22–32)
CO2: 26 mmol/L (ref 22–32)
CO2: 26 mmol/L (ref 22–32)
CO2: 27 mmol/L (ref 22–32)
CO2: 27 mmol/L (ref 22–32)
CREATININE: 2.82 mg/dL — AB (ref 0.61–1.24)
CREATININE: 2.37 mg/dL — AB (ref 0.61–1.24)
Calcium: 7.4 mg/dL — ABNORMAL LOW (ref 8.9–10.3)
Calcium: 7.5 mg/dL — ABNORMAL LOW (ref 8.9–10.3)
Calcium: 7.5 mg/dL — ABNORMAL LOW (ref 8.9–10.3)
Calcium: 7.5 mg/dL — ABNORMAL LOW (ref 8.9–10.3)
Chloride: 103 mmol/L (ref 101–111)
Chloride: 104 mmol/L (ref 101–111)
Creatinine, Ser: 2.47 mg/dL — ABNORMAL HIGH (ref 0.61–1.24)
Creatinine, Ser: 2.3 mg/dL — ABNORMAL HIGH (ref 0.61–1.24)
Creatinine, Ser: 2.15 mg/dL — ABNORMAL HIGH (ref 0.61–1.24)
Creatinine, Ser: 2.04 mg/dL — ABNORMAL HIGH (ref 0.61–1.24)
GFR calc Af Amer: 30 mL/min — ABNORMAL LOW (ref >60)
GFR calc Af Amer: 38 mL/min — ABNORMAL LOW (ref >60)
GFR calc non Af Amer: 26 mL/min — ABNORMAL LOW (ref >60)
GFR calc non Af Amer: 29 mL/min — ABNORMAL LOW (ref >60)
GFR calc non Af Amer: 31 mL/min — ABNORMAL LOW (ref >60)
GFR calc non Af Amer: 33 mL/min — ABNORMAL LOW (ref >60)
GFR, EST AFRICAN AMERICAN: 26 mL/min — AB (ref >60)
GFR, EST AFRICAN AMERICAN: 32 mL/min — AB (ref >60)
GFR, EST AFRICAN AMERICAN: 33 mL/min — AB (ref >60)
GFR, EST AFRICAN AMERICAN: 36 mL/min — AB (ref >60)
GFR, EST NON AFRICAN AMERICAN: 22 mL/min — AB (ref >60)
GFR, EST NON AFRICAN AMERICAN: 28 mL/min — AB (ref >60)
GLUCOSE: 150 mg/dL — AB (ref 65–99)
GLUCOSE: 128 mg/dL — AB (ref 65–99)
Glucose, Bld: 143 mg/dL — ABNORMAL HIGH (ref 65–99)
Glucose, Bld: 156 mg/dL — ABNORMAL HIGH (ref 65–99)
Glucose, Bld: 158 mg/dL — ABNORMAL HIGH (ref 65–99)
Glucose, Bld: 161 mg/dL — ABNORMAL HIGH (ref 65–99)
PHOSPHORUS: 2.5 mg/dL (ref 2.5–4.6)
PHOSPHORUS: 2.8 mg/dL (ref 2.5–4.6)
POTASSIUM: 3.5 mmol/L (ref 3.5–5.1)
POTASSIUM: 3.3 mmol/L — AB (ref 3.5–5.1)
POTASSIUM: 3.6 mmol/L (ref 3.5–5.1)
Phosphorus: 2.2 mg/dL — ABNORMAL LOW (ref 2.5–4.6)
Phosphorus: 2.3 mg/dL — ABNORMAL LOW (ref 2.5–4.6)
Phosphorus: 2.6 mg/dL (ref 2.5–4.6)
Phosphorus: 3.4 mg/dL (ref 2.5–4.6)
Potassium: 3.3 mmol/L — ABNORMAL LOW (ref 3.5–5.1)
Potassium: 3.6 mmol/L (ref 3.5–5.1)
Potassium: 3.7 mmol/L (ref 3.5–5.1)
SODIUM: 138 mmol/L (ref 135–145)
SODIUM: 136 mmol/L (ref 135–145)
Sodium: 135 mmol/L (ref 135–145)
Sodium: 136 mmol/L (ref 135–145)
Sodium: 136 mmol/L (ref 135–145)
Sodium: 138 mmol/L (ref 135–145)

## 2017-11-13 LAB — MAGNESIUM
MAGNESIUM: 1.7 mg/dL (ref 1.7–2.4)
MAGNESIUM: 1.7 mg/dL (ref 1.7–2.4)
MAGNESIUM: 1.7 mg/dL (ref 1.7–2.4)
Magnesium: 1.8 mg/dL (ref 1.7–2.4)
Magnesium: 1.7 mg/dL (ref 1.7–2.4)
Magnesium: 1.7 mg/dL (ref 1.7–2.4)

## 2017-11-13 LAB — GLUCOSE, CAPILLARY
GLUCOSE-CAPILLARY: 124 mg/dL — AB (ref 65–99)
GLUCOSE-CAPILLARY: 132 mg/dL — AB (ref 65–99)
GLUCOSE-CAPILLARY: 149 mg/dL — AB (ref 65–99)
GLUCOSE-CAPILLARY: 159 mg/dL — AB (ref 65–99)
Glucose-Capillary: 151 mg/dL — ABNORMAL HIGH (ref 65–99)
Glucose-Capillary: 145 mg/dL — ABNORMAL HIGH (ref 65–99)

## 2017-11-13 LAB — CBC
HEMATOCRIT: 25.7 % — AB (ref 40.0–52.0)
HEMOGLOBIN: 9 g/dL — AB (ref 13.0–18.0)
MCH: 32.4 pg (ref 26.0–34.0)
MCHC: 35 g/dL (ref 32.0–36.0)
MCV: 92.6 fL (ref 80.0–100.0)
Platelets: 193 10*3/uL (ref 150–440)
RBC: 2.78 MIL/uL — AB (ref 4.40–5.90)
RDW: 15.2 % — ABNORMAL HIGH (ref 11.5–14.5)
WBC: 15.5 10*3/uL — AB (ref 3.8–10.6)

## 2017-11-13 LAB — TRIGLYCERIDES: Triglycerides: 377 mg/dL — ABNORMAL HIGH (ref <150)

## 2017-11-13 MED ORDER — FENTANYL CITRATE (PF) 100 MCG/2ML IJ SOLN
100.0000 ug | Freq: Once | INTRAMUSCULAR | Status: AC
Start: 2017-11-13 — End: 2017-11-13
  Administered 2017-11-13: 100 ug via INTRAVENOUS

## 2017-11-13 MED ORDER — POTASSIUM CHLORIDE 10 MEQ/100ML IV SOLN
10.0000 meq | INTRAVENOUS | Status: AC
Start: 2017-11-13 — End: 2017-11-13
  Administered 2017-11-13 (×4): 10 meq via INTRAVENOUS
  Filled 2017-11-13 (×4): qty 100

## 2017-11-13 MED ORDER — MIDAZOLAM BOLUS VIA INFUSION
2.0000 mg | Freq: Once | INTRAVENOUS | Status: AC
  Administered 2017-11-13: 2 mg via INTRAVENOUS
  Filled 2017-11-13: qty 2

## 2017-11-13 MED ORDER — SODIUM CHLORIDE 0.9 % IV SOLN
250.0000 mg | Freq: Once | INTRAVENOUS | Status: AC
  Administered 2017-11-13: 250 mg via INTRAVENOUS
  Filled 2017-11-13: qty 2.5

## 2017-11-13 MED ORDER — FENTANYL CITRATE (PF) 100 MCG/2ML IJ SOLN
INTRAMUSCULAR | Status: AC
  Filled 2017-11-13: qty 2

## 2017-11-13 MED ORDER — LEVETIRACETAM 500 MG/5ML IV SOLN
Freq: Two times a day (BID) | INTRAVENOUS | Status: DC
  Administered 2017-11-14 – 2017-11-16 (×6): via INTRAVENOUS
  Filled 2017-11-13 (×8): qty 10

## 2017-11-13 NOTE — Progress Notes (Signed)
Pharmacy Electrolyte  And CRRT Monitoring Consult:  Pharmacy consulted to assist in monitoring and replacing electrolytes in this 62 y.o. male admitted on 11/16/2017 with Allergic Reaction  Patient currently on CRRT. Patient with consistent CRRT interruptions throughout shift. Dialysis catheter has been replaced.   Labs:  Sodium (mmol/L)  Date Value  11/13/2017 136   Potassium (mmol/L)  Date Value  11/13/2017 3.3 (L)   Magnesium (mg/dL)  Date Value  69/62/952804/20/2019 1.7   Phosphorus (mg/dL)  Date Value  41/32/440104/20/2019 2.5   Calcium (mg/dL)  Date Value  02/72/536604/20/2019 7.5 (L)   Albumin (g/dL)  Date Value  44/03/474204/20/2019 1.4 (L)    Plan: Electrolytes: K 3.3 - discussed with nephrologist who recommended KCl 10 mEq IV x 4 runs. Labs already ordered. Pharmacy should defer replacement until speaking with nephrology.   CRRT med review: No further CRRT adjustments warranted.   Pharmacy will continue to monitor and adjust per consult.   Marty HeckWang, Leianne Callins L 11/13/2017 12:10 PM

## 2017-11-13 NOTE — Progress Notes (Signed)
Pharmacy Antibiotic Note  Dustin Orozco is a 62 y.o. male admitted on 11/16/2017 with angioedema. Patient currently requiring mechanical ventilation and being treated for MSSA pneumonia.  Pharmacy has been consulted for Cefazolin dosing. Patient initiated on CRRT on 4/18.   Plan: Continue cefazolin 2g IV Q8hr.   Height: 6\' 1"  (185.4 cm) Weight: 213 lb 6.5 oz (96.8 kg) IBW/kg (Calculated) : 79.9  Temp (24hrs), Avg:99.2 F (37.3 C), Min:98.2 F (36.8 C), Max:99.9 F (37.7 C)  Recent Labs  Lab 11/08/17 0458 11/09/17 0733 11/10/17 0503  11/12/17 0556  11/12/17 1707 11/12/17 2005 11/13/17 0010 11/13/17 0450 11/13/17 0926  WBC 17.6* 24.8* 30.7*  --  14.1*  --   --   --   --  15.5*  --   CREATININE 2.66* 3.63* 3.36*   < > 2.48*   < > 3.63* 3.31* 2.82* 2.47* 2.37*   < > = values in this interval not displayed.    Estimated Creatinine Clearance: 39.1 mL/min (A) (by C-G formula based on SCr of 2.37 mg/dL (H)).    Allergies  Allergen Reactions  . Lisinopril     Angioedema    Antimicrobials this admission: Cefepime 4/14 >> 4/15 Unasyn 4/16  >> 4/17 Cefazolin 4/17 >>  Dose adjustments this admission: 4/15 Cefepime transitioned from 500mg  to 1000mg    Microbiology results: 4/17 Tracheal Aspirate: MSSA 4/17 Urine Cx: > 20K Staph Species  4/17 BCx x2 NGTD x3 days 4/15 Sputum: MSSA 4/7  MRSA PCR: negative   Thank you for allowing pharmacy to be a part of this patient's care.  Marty HeckWang, Banesa Tristan L 11/13/2017 12:04 PM

## 2017-11-13 NOTE — Progress Notes (Signed)
PULMONARY / CRITICAL CARE MEDICINE   Name: Dustin Orozco MRN: [id number] DOB: 18-Jun-1956    ADMISSION DATE:  10/26/2017  PT PROFILE:   [age] M on chronic ACE inhibitor therapy, nasally intubated intubated by ENT in ED for angioedema with severe macroglossia and lip swelling.  MAJOR EVENTS/TEST RESULTS: 04/08 developed anuric renal failure 04/08 Renal US: no hydro, bladder decompressed 04/09 Renal consultation: "Acute renal failure is likely secondary to severe ATN likely from hypotension.  Renal ultrasound is negative for obstruction" 04/10 Extubated. Tolerated well but requiring [place] O2 @ 6 LPM 04/11 tolerating extubation well.  However, increasing agitated delirium.  Wife reports heavy chronic alcohol use.  Lorazepam initiated.  Persistent agitation.  Dexmedetomidine infusion initiated. 04/12 BIPAP initiated las night 04/14 Intubated 4/15 remains intubated 4/17 remains delerious and intubated 4/18 +CVA +hydroPTX s/p chest tube, started CRRT 4/20: Patients status was tenuous yesterday. Required central line placement. Also dialysis catheter stopped working, was replaced. Had some twitching activity noted. Given Keppra, Ativan and started on a Versed infusion.  INDWELLING DEVICES:: ETT (L naris) 04/07 >> 04/10 Left VAsc cath 4/18 Chest tube 4/18  MICRO DATA: MRSA PCR 04/07 >> NEG   Anti-infectives (From admission, onward)   Start     Dose/Rate Route Frequency Ordered Stop   11/11/17 2200  ceFAZolin (ANCEF) IVPB 2g/100 mL premix     2 g 200 mL/hr over 30 Minutes Intravenous Every 8 hours 11/11/17 1725     11/10/17 1245  ceFAZolin (ANCEF) IVPB 1 g/50 mL premix  Status:  Discontinued     1 g 100 mL/hr over 30 Minutes Intravenous Every 12 hours 11/10/17 1237 11/11/17 1725   11/09/17 2200  Ampicillin-Sulbactam (UNASYN) 3 g in sodium chloride 0.9 % 100 mL IVPB  Status:  Discontinued     3 g 200 mL/hr over 30 Minutes Intravenous Every 12 hours 11/09/17 1649 11/10/17 1004   11/09/17 1115   Ampicillin-Sulbactam (UNASYN) 3 g in sodium chloride 0.9 % 100 mL IVPB  Status:  Discontinued     3 g 200 mL/hr over 30 Minutes Intravenous Every 12 hours 11/09/17 1112 11/09/17 1649   11/08/17 2200  ceFEPIme (MAXIPIME) 1 g in sodium chloride 0.9 % 100 mL IVPB  Status:  Discontinued     1 g 200 mL/hr over 30 Minutes Intravenous Every 24 hours 11/08/17 1551 11/09/17 1005   11/07/17 2200  ceFEPIme (MAXIPIME) 500 mg in dextrose 5 % 50 mL IVPB  Status:  Discontinued     500 mg 100 mL/hr over 30 Minutes Intravenous Every 24 hours 11/07/17 1808 11/08/17 1551      SUBJECTIVE:  Severe resp failure On vent  remains critically ill +STAPH pneumonia CT head +CVA CT chest +hydropneumothorax, lung abscess/empyema Chest tube in place On vasopressors and CRRT   VITAL SIGNS: BP 113/73   Pulse (!) 105   Temp 98.9 F (37.2 C) (Oral)   Resp (!) 23   Ht 6\' 1"  (1.854 m)   Wt 213 lb 6.5 oz (96.8 kg)   SpO2 100%   BMI 28.16 kg/m    VENTILATOR SETTINGS: Vent Mode: PRVC FiO2 (%):  [40 %-75 %] 40 % Set Rate:  [24 bmp] 24 bmp Vt Set:  [500 mL] 500 mL PEEP:  [8 cmH20] 8 cmH20 Pressure Support:  [5 cmH20] 5 cmH20 BIPAP for sleep  INTAKE / OUTPUT: I/O last 3 completed shifts: In: 6177.7 [I.V.:4313; NG/GT:1514.7; IV Piggyback:350] Out: 1489 [Urine:484; Emesis/NG output:15; Chest Tube:990]  PHYSICAL EXAMINATION: General:  on vent GCS<8T Neuro: No focal neurological deficits, confused, agitated HEENT: NCAT, sclerae white Cardiovascular: RRR, no M Lungs: Rhonchi on right, clear on left +chest tube RT side Abdomen: Soft, NT, NABS Extremities: Warm, no edema  LABS:  BMET Recent Labs  Lab 11/12/17 2005 11/13/17 0010 11/13/17 0450  NA 140 138 138  K 3.8 3.6 3.5  CL 107 105 105  CO2 24 25 25   BUN 103* 97* 81*  CREATININE 3.31* 2.82* 2.47*  GLUCOSE 185* 158* 161*    Electrolytes Recent Labs  Lab 11/12/17 2005 11/13/17 0010 11/13/17 0450  CALCIUM 7.3* 7.4* 7.5*  MG 1.8 1.7 1.7   PHOS 4.5 3.4 2.6    CBC Recent Labs  Lab 11/10/17 0503 11/12/17 0556 11/13/17 0450  WBC 30.7* 14.1* 15.5*  HGB 11.6* 10.2* 9.0*  HCT 34.6* 30.3* 25.7*  PLT 252 182 193    Coag's No results for input(s): APTT, INR in the last 168 hours.  Sepsis Markers Recent Labs  Lab 11/10/17 0825  PROCALCITON 4.97    ABG Recent Labs  Lab 11/08/17 1908 11/12/17 0455 11/12/17 0845  PHART 7.40 7.12* 7.25*  PCO2ART 34 84* 58*  PO2ART 63* 99 101    Liver Enzymes Recent Labs  Lab 11/08/17 0458  11/12/17 2005 11/13/17 0010 11/13/17 0450  AST 75*  --   --   --   --   ALT 75*  --   --   --   --   ALKPHOS 102  --   --   --   --   BILITOT 1.9*  --   --   --   --   ALBUMIN 2.2*   < > 1.4* 1.4* 1.4*   < > = values in this interval not displayed.    Cardiac Enzymes No results for input(s): TROPONINI, PROBNP in the last 168 hours.  Glucose Recent Labs  Lab 11/12/17 1208 11/12/17 1749 11/12/17 2018 11/13/17 0014 11/13/17 0444 11/13/17 0725  GLUCAP 118* 179* 160* 149* 151* 159*    CXR: Mild vascular congestion, mild RLL atelectasis ASSESSMENT / PLAN: 62 yo AAM admitted for severe angioedema s/p extubation and re-intubated for severe DT's with encephalopathy with severe resp failure on vent complicated by STAPH pneumonia with persistent comatosed state complicated by acute CVA with with progressive renal failure with RT sided lung abscess/empyema with chest tube, severe hypoxia   PULMONARY A: Acute hypoxic Respiratory failure secondary to airway compromise from DTs STAPH pneumonia lung abscess/empyema Angioedema-resolved P:   Continue supplemental oxygen as needed to maintain SPO2 >90% Continue nebulized bronchodilators as needed ABX as listed above Vent support   CARDIOVASCULAR A: septic shock Vasopressors as needed keep MAP>65 Iv ABX   RENAL A:   AKI,  Hypernatremia, hyperchloremic azotemia P:   Monitor BMET intermittently Monitor I/Os Added free  water, D5 started Electrolytes replacement vasc cath placed on CRRT  GASTROINTESTINAL A:   Dysphagia due to AMS P:   SUP: N/I postextubation NGT for enteral nutrition  INFECTIOUS STAPH PNEUMONIA leukocytosis CEFAZOLIN RT sided lung abscess/empyema  ENDOCRINE A:   Mild hyperglycemia without prior history of DM P:   CBGs q 8 hrs Consider SSI for glucose >180  NEUROLOGIC A:   History of heavy alcohol use Alcohol withdrawals with Delirium Tremens Continue thiamine, Folic acid and MVI. Has failed multiple SAT/SBT's +CVA Follow up neuro recs  Anemia: No evidence of active bleeding will follow   Critical Care Time devoted to patient care services described in  this note is 45    Tora Kindred, DO   Patient ID: Dustin Orozco, male   DOB: [birth year], 62 y.o.   MRN: [id number]

## 2017-11-13 NOTE — Progress Notes (Signed)
[facility]  ROUNDING NOTE   Subjective:  Patient remains critically ill at the moment. Continues on CRRT. UOP at 264 over the preceding 24 hours. There were some issues with the dialysis catheter yesterday.   Objective:  Vital signs in last 24 hours:  Temp:  [97.8 F (36.6 C)-99.9 F (37.7 C)] 99.8 F (37.7 C) (04/20 0400) Pulse Rate:  [50-82] 82 (04/19 2300) Resp:  [16-28] 26 (04/20 0600) BP: (76-144)/(47-92) 119/74 (04/20 0600) SpO2:  [93 %-100 %] 100 % (04/20 0400) FiO2 (%):  [40 %-75 %] 40 % (04/20 0355) Weight:  [96.8 kg (213 lb 6.5 oz)] 96.8 kg (213 lb 6.5 oz) (04/20 0500)  Weight change: 0.3 kg (10.6 oz) Filed Weights   11/11/17 0407 11/12/17 0500 11/13/17 0500  Weight: 91.1 kg (200 lb 13.4 oz) 96.5 kg (212 lb 11.9 oz) 96.8 kg (213 lb 6.5 oz)    Intake/Output: I/O last 3 completed shifts: In: 5218.3 [I.V.:3530.3; NG/GT:1288; IV Piggyback:400] Out: 62 [Urine:679; Emesis/NG output:15; Chest Tube:640]   Intake/Output this shift:  Total I/O In: 2533.1 [I.V.:1836.4; NG/GT:696.7] Out: 555 [Urine:205; Chest Tube:350]  Physical Exam: General:  Critically ill appearing  Head:  ETT/NG in place  Eyes: Anicteric  Neck: Supple, trachea midline  Lungs:   Scattered rhonchi, vent assisted  Heart: S1S2 no rubs  Abdomen:  Soft, nontender, bowel sounds present  Extremities: 1+ peripheral edema.  Neurologic: Intubated, not following commands  Skin: No lesions       Basic Metabolic Panel: Recent Labs  Lab 11/12/17 0949 11/12/17 1707 11/12/17 2005 11/13/17 0010 11/13/17 0450  NA 133* 141 140 138 138  K 3.8 4.1 3.8 3.6 3.5  CL 98* 107 107 105 105  CO2 27 24 24 25 25   GLUCOSE 193* 192* 185* 158* 161*  BUN 16 112* 103* 97* 81*  CREATININE 1.59* 3.63* 3.31* 2.82* 2.47*  CALCIUM 10.5* 7.3* 7.3* 7.4* 7.5*  MG 1.8 1.9 1.8 1.7 1.7  PHOS 3.1 5.8* 4.5 3.4 2.6    Liver Function Tests: Recent Labs  Lab 11/08/17 0458  11/12/17 0949 11/12/17 1707  11/12/17 2005 11/13/17 0010 11/13/17 0450  AST 75*  --   --   --   --   --   --   ALT 75*  --   --   --   --   --   --   ALKPHOS 102  --   --   --   --   --   --   BILITOT 1.9*  --   --   --   --   --   --   PROT 5.5*  --   --   --   --   --   --   ALBUMIN 2.2*   < > 2.2* 1.4* 1.4* 1.4* 1.4*   < > = values in this interval not displayed.   No results for input(s): LIPASE, AMYLASE in the last 168 hours. Recent Labs  Lab 11/07/17 1343  AMMONIA 32    CBC: Recent Labs  Lab 11/08/17 0458 11/09/17 0733 11/10/17 0503 11/12/17 0556 11/13/17 0450  WBC 17.6* 24.8* 30.7* 14.1* 15.5*  NEUTROABS 15.6* 23.1* 28.5*  --   --   HGB 11.5* 11.9* 11.6* 10.2* 9.0*  HCT 32.9* 35.1* 34.6* 30.3* 25.7*  MCV 95.5 96.6 94.7 96.3 92.6  PLT 187 219 252 182 193    Cardiac Enzymes: No results for input(s): CKTOTAL, CKMB, CKMBINDEX, TROPONINI in the last 168 hours.  BNP: Invalid input(s): POCBNP  CBG: Recent Labs  Lab 11/12/17 1208 11/12/17 1749 11/12/17 2018 11/13/17 0014 11/13/17 0444  GLUCAP 118* 179* 160* 149* 151*    Microbiology: Results for orders placed or performed during the hospital encounter of 11/18/2017  MRSA PCR Screening     Status: None   Collection Time: 11/21/2017 11:38 PM  Result Value Ref Range Status   MRSA by PCR NEGATIVE NEGATIVE Final    Comment:        The GeneXpert MRSA Assay (FDA approved for NASAL specimens only), is one component of a comprehensive MRSA colonization surveillance program. It is not intended to diagnose MRSA infection nor to guide or monitor treatment for MRSA infections. Performed at [facility], [address] [place], [place] [address]   Culture, respiratory (NON-Expectorated)     Status: None   Collection Time: 11/08/17  5:14 AM  Result Value Ref Range Status   Specimen Description   Final    TRACHEAL ASPIRATE Performed at [facility], [address], [place], [place] [address]    Special Requests   Final     NONE Performed at [facility], [address] [place], [place] [address]    Gram Stain   Final    FEW WBC PRESENT, PREDOMINANTLY PMN ABUNDANT GRAM POSITIVE COCCI ABUNDANT GRAM NEGATIVE RODS Performed at [facility], [place] [address] [place], [place] [address]    Culture ABUNDANT STAPHYLOCOCCUS AUREUS  Final   Report Status 11/10/2017 FINAL  Final   Organism ID, Bacteria STAPHYLOCOCCUS AUREUS  Final      Susceptibility   Staphylococcus aureus - MIC*    CIPROFLOXACIN <=0.5 SENSITIVE Sensitive     ERYTHROMYCIN <=0.25 SENSITIVE Sensitive     GENTAMICIN <=0.5 SENSITIVE Sensitive     OXACILLIN 0.5 SENSITIVE Sensitive     TETRACYCLINE <=1 SENSITIVE Sensitive     VANCOMYCIN 1 SENSITIVE Sensitive     TRIMETH/SULFA <=10 SENSITIVE Sensitive     CLINDAMYCIN <=0.25 SENSITIVE Sensitive     RIFAMPIN <=0.5 SENSITIVE Sensitive     Inducible Clindamycin NEGATIVE Sensitive     * ABUNDANT STAPHYLOCOCCUS AUREUS  Urine Culture     Status: Abnormal   Collection Time: 11/10/17  6:34 AM  Result Value Ref Range Status   Specimen Description   Final    URINE, RANDOM Performed at [facility], [address] [place],  [address]    Special Requests   Final    NONE Performed at [facility], [place],  [address]    Culture (A)  Final    20,000 COLONIES/mL STAPHYLOCOCCUS SPECIES (COAGULASE NEGATIVE)   Report Status 11/12/2017 FINAL  Final   Organism ID, Bacteria STAPHYLOCOCCUS SPECIES (COAGULASE NEGATIVE) (A)  Final      Susceptibility   Staphylococcus species (coagulase negative) - MIC*    CIPROFLOXACIN <=0.5 SENSITIVE Sensitive     GENTAMICIN <=0.5 SENSITIVE Sensitive     NITROFURANTOIN <=16 SENSITIVE Sensitive     OXACILLIN <=0.25 SENSITIVE Sensitive     TETRACYCLINE <=1 SENSITIVE Sensitive     VANCOMYCIN <=0.5 SENSITIVE Sensitive     TRIMETH/SULFA <=10 SENSITIVE Sensitive     CLINDAMYCIN 0.5 SENSITIVE Sensitive      RIFAMPIN <=0.5 SENSITIVE Sensitive     Inducible Clindamycin NEGATIVE Sensitive     * 20,000 COLONIES/mL STAPHYLOCOCCUS SPECIES (COAGULASE NEGATIVE)  CULTURE, BLOOD (ROUTINE X 2) w Reflex to ID Panel     Status: None (Preliminary  result)   Collection Time: 11/10/17  8:15 AM  Result Value Ref Range Status   Specimen Description BLOOD BLOOD LEFT HAND  Final   Special Requests   Final    BOTTLES DRAWN AEROBIC AND ANAEROBIC Blood Culture adequate volume   Culture   Final    NO GROWTH 2 DAYS Performed at [facility], [address] [place], [place] [address]    Report Status PENDING  Incomplete  CULTURE, BLOOD (ROUTINE X 2) w Reflex to ID Panel     Status: None (Preliminary result)   Collection Time: 11/10/17  8:25 AM  Result Value Ref Range Status   Specimen Description BLOOD BLOOD RIGHT HAND  Final   Special Requests   Final    BOTTLES DRAWN AEROBIC AND ANAEROBIC Blood Culture adequate volume   Culture   Final    NO GROWTH 2 DAYS Performed at [facility], [address], [place], [place] [address]    Report Status PENDING  Incomplete  Culture, respiratory (NON-Expectorated)     Status: None   Collection Time: 11/10/17 11:13 AM  Result Value Ref Range Status   Specimen Description   Final    TRACHEAL ASPIRATE Performed at [facility], [address], [place], [place] [address]    Special Requests   Final    NONE Performed at [facility], [address] [place], [place] [address]    Gram Stain   Final    RARE WBC PRESENT, PREDOMINANTLY PMN RARE GRAM NEGATIVE COCCOBACILLI Performed at [facility], [place] [address] [place], [place] [address]    Culture MODERATE STAPHYLOCOCCUS AUREUS  Final   Report Status 11/12/2017 FINAL  Final   Organism ID, Bacteria STAPHYLOCOCCUS AUREUS  Final      Susceptibility   Staphylococcus aureus - MIC*    CIPROFLOXACIN <=0.5 SENSITIVE Sensitive     ERYTHROMYCIN <=0.25 SENSITIVE Sensitive      GENTAMICIN <=0.5 SENSITIVE Sensitive     OXACILLIN 0.5 SENSITIVE Sensitive     TETRACYCLINE <=1 SENSITIVE Sensitive     VANCOMYCIN <=0.5 SENSITIVE Sensitive     TRIMETH/SULFA <=10 SENSITIVE Sensitive     CLINDAMYCIN <=0.25 SENSITIVE Sensitive     RIFAMPIN <=0.5 SENSITIVE Sensitive     Inducible Clindamycin NEGATIVE Sensitive     * MODERATE STAPHYLOCOCCUS AUREUS  Aerobic/Anaerobic Culture (surgical/deep wound)     Status: None (Preliminary result)   Collection Time: 11/11/17  6:01 PM  Result Value Ref Range Status   Specimen Description WOUND  Final   Special Requests   Final    Normal Performed at [facility], [address] [place],  [address]    Gram Stain   Final    ABUNDANT WBC PRESENT, PREDOMINANTLY PMN MODERATE GRAM POSITIVE COCCI RARE GRAM POSITIVE RODS    Culture PENDING  Incomplete   Report Status PENDING  Incomplete    Coagulation Studies: No results for input(s): LABPROT, INR in the last 72 hours.  Urinalysis: Recent Labs    11/10/17 0634  COLORURINE AMBER*  LABSPEC 1.014  PHURINE 5.0  GLUCOSEU 50*  HGBUR MODERATE*  BILIRUBINUR NEGATIVE  KETONESUR NEGATIVE  PROTEINUR 30*  NITRITE NEGATIVE  LEUKOCYTESUR NEGATIVE      Imaging: Dg Chest 1 View  Result Date: 11/11/2017 CLINICAL DATA:  Central line placement. EXAM: CHEST  1 VIEW COMPARISON:  Chest x-ray dated November 08, 2017. FINDINGS: Interval placement of a left internal jugular dialysis catheter with the tip in the  distal SVC. Unchanged positioning of the endotracheal and enteric tubes. Worsening right-sided pleural effusion with near complete opacification of the right hemithorax. There is some aeration within the right upper lobe. No mediastinal shift. Unchanged atelectasis at the left lung base. No definite pneumothorax. No acute osseous abnormality. IMPRESSION: 1. Worsening right-sided pleural effusion with near complete opacification of the right hemithorax. Recommend chest CT for  further evaluation. 2. Interval placement of a left internal jugular dialysis catheter with the tip in the distal SVC. No definite pneumothorax. Electronically Signed   By: Titus Dubin M.D.   On: 11/11/2017 15:47   Ct Head Wo Contrast  Result Date: 11/11/2017 CLINICAL DATA:  Altered mental status. EXAM: CT HEAD WITHOUT CONTRAST TECHNIQUE: Contiguous axial images were obtained from the base of the skull through the vertex without intravenous contrast. COMPARISON:  None. FINDINGS: Brain: Hypodensity and loss of the normal gray-white matter differentiation within the left parafalcine parietal lobe, concerning for acute to subacute infarct. Additional focal hypodensity within the genu of left internal capsule. No hemorrhage, hydrocephalus, extra-axial collection or mass lesion/mass effect. Vascular: No hyperdense vessel or unexpected calcification. Skull: Normal. Negative for fracture or focal lesion. Sinuses/Orbits: Complete opacification of the left frontal, maxillary, and sphenoid sinuses, as well as the left ethmoid air cells. Partial opacification of the bilateral mastoid air cells. No acute orbital abnormality. Other: None. IMPRESSION: 1. Hypodensity within the left parafalcine parietal lobe, concerning for acute to subacute infarct. 2. 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 3. Extensive left-sided paranasal sinus disease. These results were called by telephone at the time of interpretation on 11/11/2017 at 9:52 am to Dr. Flora Lipps , who verbally acknowledged these results. Electronically Signed   By: Titus Dubin M.D.   On: 11/11/2017 09:53   Ct Chest Wo Contrast  Result Date: 11/11/2017 CLINICAL DATA:  62 year old male with opacified RIGHT hemithorax and respiratory failure. EXAM: CT CHEST WITHOUT CONTRAST TECHNIQUE: Multidetector CT imaging of the chest was performed following the standard protocol without IV contrast. COMPARISON:  11/11/2017 and prior  radiographs FINDINGS: Cardiovascular: Mild cardiomegaly and coronary artery atherosclerotic calcifications noted. Aortic atherosclerotic calcifications identified without aneurysm. No pericardial effusion. Mediastinum/Nodes: Shotty mediastinal lymph nodes are present. An endotracheal tube is present with tip approximately 4 cm above the carina. A LEFT IJ central venous catheter is noted with tip in the mid SVC. An NG tube is noted with tip in the mid stomach. No mediastinal mass identified. Lungs/Pleura: A large possibly loculated RIGHT hydropneumothorax noted. Atelectasis of the majority of the RIGHT lung noted. Airspace disease within the lingula noted. Atelectasis/consolidation of the LEFT LOWER lobe noted. Upper Abdomen: No acute abnormality. Musculoskeletal: No acute or suspicious bony abnormalities. IMPRESSION: 1. Large possibly loculated RIGHT hydropneumothorax which may be related to infection or possibly malignancy. Associated compressive atelectasis of the majority of the RIGHT lung. Clinical team is aware of this per note. 2. Lingular airspace disease likely representing aspiration or pneumonia. LEFT LOWER lobe atelectasis/consolidation. 3. Support apparatus as described 4. Mild cardiomegaly and coronary artery disease 5.  Aortic Atherosclerosis (ICD10-I70.0). Electronically Signed   By: Margarette Canada M.D.   On: 11/11/2017 17:28   Dg Chest Port 1 View  Result Date: 11/12/2017 CLINICAL DATA:  Central line placement. EXAM: PORTABLE CHEST 1 VIEW COMPARISON:  11/12/2017 at 0435 hours FINDINGS: Endotracheal tube terminates 6.5 cm above the carina. The left jugular catheter has been replaced and its tip is more inferiorly located than the catheter  on the prior study, now overlying the cavoatrial junction/high right atrium. Enteric tube terminates over the gastric body with side hole beyond the GE junction. Right pigtail pleural catheter remains in place. Partially loculated right-sided pleural effusion  appears smaller than on the prior study. A small loculated right pneumothorax is suspected laterally and in the base corresponding to the areas of decreased pleural fluid. Right mid and lower lung atelectasis persist. Medial left basilar aeration has improved. IMPRESSION: 1. Left jugular catheter terminates over the cavoatrial junction/high right atrium. 2. Decreased size of partially loculated right pleural effusion with pigtail pleural catheter remaining in place. Suspected small loculated right pneumothorax. 3. Persistent right lung atelectasis. 4. Improved left basilar aeration. Electronically Signed   By: Logan Bores M.D.   On: 11/12/2017 18:19   Dg Chest Port 1 View  Result Date: 11/12/2017 CLINICAL DATA:  Respiratory failure EXAM: PORTABLE CHEST 1 VIEW COMPARISON:  November 11, 2017 chest radiograph and chest CT FINDINGS: Endotracheal tube tip is 4.8 cm above the carina. Central catheter tip is in superior vena cava. Nasogastric tube tip and side port are below the diaphragm. There is now a pigtail catheter in the right hemithorax. There has been significant drainage of right pleural effusion compared to 1 day prior. Moderate loculated effusion remains on the right. There is atelectatic change throughout the right mid and lower lung zones. There is focal consolidation in the medial left base with minimal left pleural effusion. Heart is upper normal in size with pulmonary vascularity within normal limits. No adenopathy. No bone lesions. IMPRESSION: Tube and catheter positions as described without pneumothorax. Pigtail catheter now present on the right with considerable resolution of pleural effusion on the right. Moderate partially loculated pleural effusion remains on the right. There is a minimal pleural effusion on the left. There is atelectatic change throughout right mid lower lung zones. Stable consolidation medial left base. Stable cardiac silhouette. Electronically Signed   By: Lowella Grip III  M.D.   On: 11/12/2017 07:33   Ct Image Guided Fluid Drain By Catheter  Result Date: 11/12/2017 INDICATION: Sepsis and hydropneumothorax. Please perform CT-guided placement for infection source control purposes. EXAM: CT IMAGE GUIDED FLUID DRAIN BY CATHETER COMPARISON:  Chest CT - earlier same day MEDICATIONS: The patient is currently admitted to the hospital and receiving intravenous antibiotics. The antibiotics were administered within an appropriate time frame prior to the initiation of the procedure. ANESTHESIA/SEDATION: None, the patient is currently intubated and sedated CONTRAST:  None COMPLICATIONS: None immediate. PROCEDURE: Informed written consent was obtained from the patient's family after a discussion of the risks, benefits and alternatives to treatment. The patient was placed supine on the CT gantry and a pre procedural CT was performed re-demonstrating the known moderate to large sized right-sided hydropneumothorax. The procedure was planned. A timeout was performed prior to the initiation of the procedure. The skin overlying the right anterior upper chest was prepped and draped in the usual sterile fashion. The overlying soft tissues were anesthetized with 1% lidocaine with epinephrine. Appropriate trajectory was planned with the use of a 22 gauge spinal needle. An 18 gauge trocar needle was advanced into the right pleural space and a short Amplatz super stiff wire was coiled within the collection. Appropriate positioning was confirmed with a limited CT scan. The tract was serially dilated allowing placement of a 12 [place] all-purpose drainage catheter. Appropriate positioning was confirmed with a limited postprocedural CT scan. Approximately 1.8 L of purulent fluid was aspirated. The tube  was connected to a pleural vac device and sutured in place. A dressing was placed. Postprocedural imaging demonstrates significant reduction in size of right-sided hydropneumothorax with partial re-expansion of  the right lung. The patient tolerated the procedure well without immediate post procedural complication. IMPRESSION: Successful CT guided placement of a 62 French all purpose drain catheter into the right pleural space with aspiration of 1.8 L of purulent fluid. Samples were sent to the laboratory as requested by the ordering clinical team. Electronically Signed   By: Sandi Mariscal M.D.   On: 11/12/2017 07:38     Medications:   . amiodarone 60 mg/hr (11/13/17 0600)  .  ceFAZolin (ANCEF) IV Stopped (11/13/17 5176)  . dextrose 50 mL/hr at 11/13/17 0600  . feeding supplement (VITAL 1.5 CAL) 55 mL/hr at 11/13/17 0600  . fentaNYL infusion INTRAVENOUS Stopped (11/12/17 1116)  . small volume/piggyback builder Stopped (11/12/17 2151)  . midazolam (VERSED) infusion 5 mg/hr (11/13/17 0600)  . norepinephrine (LEVOPHED) Adult infusion 5.973 mcg/min (11/13/17 0600)  . phenylephrine (NEO-SYNEPHRINE) Adult infusion Stopped (11/12/17 1617)  . pureflow    . vasopressin (PITRESSIN) infusion - *FOR SHOCK* Stopped (11/13/17 0500)   . budesonide (PULMICORT) nebulizer solution  0.25 mg Nebulization BID  . chlorhexidine gluconate (MEDLINE KIT)  15 mL Mouth Rinse BID  . feeding supplement (PRO-STAT SUGAR FREE 64)  30 mL Per Tube TID  . folic acid  1 mg Per Tube Daily  . heparin injection (subcutaneous)  5,000 Units Subcutaneous Q8H  . insulin aspart  0-9 Units Subcutaneous Q4H  . ipratropium-albuterol  3 mL Nebulization Q4H  . mouth rinse  15 mL Mouth Rinse 10 times per day  . multivitamin  15 mL Per Tube Daily  . pantoprazole sodium  40 mg Per Tube BID  . senna-docusate  2 tablet Per Tube BID  . thiamine  100 mg Per Tube Daily   acetaminophen, bisacodyl, fentaNYL, heparin, hydrALAZINE, ipratropium-albuterol, LORazepam, LORazepam, metoprolol tartrate, ondansetron (ZOFRAN) IV, sennosides, vecuronium  Assessment/ Plan:  62 y.o. male with a PMHx of osteoarthritis, hypertension, GERD who was admitted to [facility] on  11/21/2017 for evaluation of angioedema.  Patient was previously on lisinopril.   1.  Acute renal failure, secondary to hypotension. 2.  Acute respiratory failure. 3.  Angioedema. 4.  Hypotension. 5.  Right sided empyema s/p pleural drain.   Plan: Patient remains critically ill.  Dialysis catheter was replaced yesterday.  Continue CRRT at this time as patient has oliguria.  Hold off on adding ultrafiltration at the moment.  Consider adding ultrafiltration tomorrow.  Continue to monitor serum electrolytes closely.  Otherwise continue supportive care as per critical care.    LOS: [place], Peja Allender 4/20/20196:27 AM

## 2017-11-13 NOTE — Progress Notes (Signed)
Patient ID: Dustin Orozco, male   DOB: 1956/03/01, 62 y.o.   MRN: [id number]  [facility] PROGRESS NOTE  Dustin Orozco PPJ:[id number] DOB: [birth year] DOA: 10/28/2017 PCP: [facility]  HPI/Subjective: Remains intubated and sedated  Objective: Vitals:   11/13/17 1300 11/13/17 1416  BP: 115/67   Pulse: 91   Resp: 18   Temp: 98.5 F (36.9 C)   SpO2: 100% 100%    Filed Weights   11/11/17 0407 11/12/17 0500 11/13/17 0500  Weight: 91.1 kg (200 lb 13.4 oz) 96.5 kg (212 lb 11.9 oz) 96.8 kg (213 lb 6.5 oz)    ROS: Review of Systems  Unable to perform ROS: Acuity of condition   Exam: Physical Exam  HENT:  Nose: No mucosal edema.  Unable to look into mouth  Eyes: Pupils are equal, round, and reactive to light. Conjunctivae are normal.  Neck: No JVD present. Carotid bruit is not present. No edema present. No thyroid mass and no thyromegaly present.  Cardiovascular: S1 normal and S2 normal. An irregularly irregular rhythm present. Exam reveals no gallop.  No murmur heard. Pulses:      Dorsalis pedis pulses are 2+ on the right side, and 2+ on the left side.  Respiratory: No respiratory distress. He has decreased breath sounds in the right middle field, the right lower field, the left middle field and the left lower field. He has wheezes in the right middle field, the right lower field, the left middle field and the left lower field. He has no rhonchi. He has no rales.  GI: Soft. Bowel sounds are normal. There is no tenderness.  Musculoskeletal:       Right ankle: He exhibits swelling.       Left ankle: He exhibits swelling.  Lymphadenopathy:    He has no cervical adenopathy.  Neurological:  Intubated and sedated unable to get any response with sternal rub.  Skin: Skin is warm. No rash noted. Nails show no clubbing.  Psychiatric:  Intubated and sedated      Data Reviewed: Basic Metabolic Panel: Recent Labs  Lab 11/12/17 2005 11/13/17 0010 11/13/17 0450  11/13/17 0926 11/13/17 1326  NA 140 138 138 136 136  K 3.8 3.6 3.5 3.3* 3.3*  CL 107 105 105 103 103  CO2 24 25 25 26 27   GLUCOSE 185* 158* 161* 156* 143*  BUN 103* 97* 81* 73* 72*  CREATININE 3.31* 2.82* 2.47* 2.37* 2.30*  CALCIUM 7.3* 7.4* 7.5* 7.5* 7.5*  MG 1.8 1.7 1.7 1.7 1.8  PHOS 4.5 3.4 2.6 2.5 2.8   Liver Function Tests: Recent Labs  Lab 11/08/17 0458  11/12/17 2005 11/13/17 0010 11/13/17 0450 11/13/17 0926 11/13/17 1326  AST 75*  --   --   --   --   --   --   ALT 75*  --   --   --   --   --   --   ALKPHOS 102  --   --   --   --   --   --   BILITOT 1.9*  --   --   --   --   --   --   PROT 5.5*  --   --   --   --   --   --   ALBUMIN 2.2*   < > 1.4* 1.4* 1.4* 1.4* 1.3*   < > = values in this interval not displayed.    Recent Labs  Lab 11/07/17 1343  AMMONIA 32   CBC: Recent Labs  Lab 11/08/17 0458 11/09/17 0733 11/10/17 0503 11/12/17 0556 11/13/17 0450  WBC 17.6* 24.8* 30.7* 14.1* 15.5*  NEUTROABS 15.6* 23.1* 28.5*  --   --   HGB 11.5* 11.9* 11.6* 10.2* 9.0*  HCT 32.9* 35.1* 34.6* 30.3* 25.7*  MCV 95.5 96.6 94.7 96.3 92.6  PLT 187 219 252 182 193   4CBG: Recent Labs  Lab 11/12/17 2018 11/13/17 0014 11/13/17 0444 11/13/17 0725 11/13/17 1258  GLUCAP 160* 149* 151* 159* 132*    Recent Results (from the past 240 hour(s))  Culture, respiratory (NON-Expectorated)     Status: None   Collection Time: 11/08/17  5:14 AM  Result Value Ref Range Status   Specimen Description   Final    TRACHEAL ASPIRATE Performed at [facility], [address], [place], [place] [address]    Special Requests   Final    NONE Performed at [facility], [address] [place], [place] [address]    Gram Stain   Final    FEW WBC PRESENT, PREDOMINANTLY PMN ABUNDANT GRAM POSITIVE COCCI ABUNDANT GRAM NEGATIVE RODS Performed at [facility], [place] [address] [place], [place] [address]    Culture ABUNDANT STAPHYLOCOCCUS AUREUS  Final   Report  Status 11/10/2017 FINAL  Final   Organism ID, Bacteria STAPHYLOCOCCUS AUREUS  Final      Susceptibility   Staphylococcus aureus - MIC*    CIPROFLOXACIN <=0.5 SENSITIVE Sensitive     ERYTHROMYCIN <=0.25 SENSITIVE Sensitive     GENTAMICIN <=0.5 SENSITIVE Sensitive     OXACILLIN 0.5 SENSITIVE Sensitive     TETRACYCLINE <=1 SENSITIVE Sensitive     VANCOMYCIN 1 SENSITIVE Sensitive     TRIMETH/SULFA <=10 SENSITIVE Sensitive     CLINDAMYCIN <=0.25 SENSITIVE Sensitive     RIFAMPIN <=0.5 SENSITIVE Sensitive     Inducible Clindamycin NEGATIVE Sensitive     * ABUNDANT STAPHYLOCOCCUS AUREUS  Urine Culture     Status: Abnormal   Collection Time: 11/10/17  6:34 AM  Result Value Ref Range Status   Specimen Description   Final    URINE, RANDOM Performed at [facility], [address] [place], [place] [address]    Special Requests   Final    NONE Performed at [facility], [address], [place],  [address]    Culture (A)  Final    20,000 COLONIES/mL STAPHYLOCOCCUS SPECIES (COAGULASE NEGATIVE)   Report Status 11/12/2017 FINAL  Final   Organism ID, Bacteria STAPHYLOCOCCUS SPECIES (COAGULASE NEGATIVE) (A)  Final      Susceptibility   Staphylococcus species (coagulase negative) - MIC*    CIPROFLOXACIN <=0.5 SENSITIVE Sensitive     GENTAMICIN <=0.5 SENSITIVE Sensitive     NITROFURANTOIN <=16 SENSITIVE Sensitive     OXACILLIN <=0.25 SENSITIVE Sensitive     TETRACYCLINE <=1 SENSITIVE Sensitive     VANCOMYCIN <=0.5 SENSITIVE Sensitive     TRIMETH/SULFA <=10 SENSITIVE Sensitive     CLINDAMYCIN 0.5 SENSITIVE Sensitive     RIFAMPIN <=0.5 SENSITIVE Sensitive     Inducible Clindamycin NEGATIVE Sensitive     * 20,000 COLONIES/mL STAPHYLOCOCCUS SPECIES (COAGULASE NEGATIVE)  CULTURE, BLOOD (ROUTINE X 2) w Reflex to ID Panel     Status: None (Preliminary result)   Collection Time: 11/10/17  8:15 AM  Result Value Ref Range Status   Specimen Description BLOOD BLOOD LEFT HAND   Final   Special Requests   Final    BOTTLES  DRAWN AEROBIC AND ANAEROBIC Blood Culture adequate volume   Culture   Final    NO GROWTH 3 DAYS Performed at [facility], [address] [place], [place] [address]    Report Status PENDING  Incomplete  CULTURE, BLOOD (ROUTINE X 2) w Reflex to ID Panel     Status: None (Preliminary result)   Collection Time: 11/10/17  8:25 AM  Result Value Ref Range Status   Specimen Description BLOOD BLOOD RIGHT HAND  Final   Special Requests   Final    BOTTLES DRAWN AEROBIC AND ANAEROBIC Blood Culture adequate volume   Culture   Final    NO GROWTH 3 DAYS Performed at [facility], [address], [place], [place] [address]    Report Status PENDING  Incomplete  Culture, respiratory (NON-Expectorated)     Status: None   Collection Time: 11/10/17 11:13 AM  Result Value Ref Range Status   Specimen Description   Final    TRACHEAL ASPIRATE Performed at [facility], [address], [place], [place] [address]    Special Requests   Final    NONE Performed at [facility], [address] [place], [place] [address]    Gram Stain   Final    RARE WBC PRESENT, PREDOMINANTLY PMN RARE GRAM NEGATIVE COCCOBACILLI Performed at [facility], [place] [address], [place], [place] [address]    Culture MODERATE STAPHYLOCOCCUS AUREUS  Final   Report Status 11/12/2017 FINAL  Final   Organism ID, Bacteria STAPHYLOCOCCUS AUREUS  Final      Susceptibility   Staphylococcus aureus - MIC*    CIPROFLOXACIN <=0.5 SENSITIVE Sensitive     ERYTHROMYCIN <=0.25 SENSITIVE Sensitive     GENTAMICIN <=0.5 SENSITIVE Sensitive     OXACILLIN 0.5 SENSITIVE Sensitive     TETRACYCLINE <=1 SENSITIVE Sensitive     VANCOMYCIN <=0.5 SENSITIVE Sensitive     TRIMETH/SULFA <=10 SENSITIVE Sensitive     CLINDAMYCIN <=0.25 SENSITIVE Sensitive     RIFAMPIN <=0.5 SENSITIVE Sensitive     Inducible Clindamycin NEGATIVE Sensitive     * MODERATE  STAPHYLOCOCCUS AUREUS  Aerobic/Anaerobic Culture (surgical/deep wound)     Status: None (Preliminary result)   Collection Time: 11/11/17  6:01 PM  Result Value Ref Range Status   Specimen Description   Final    WOUND Performed at [facility], [address], [place], [place] [address]    Special Requests   Final    Normal Performed at [facility], [address] [place], [place] [address]    Gram Stain   Final    ABUNDANT WBC PRESENT, PREDOMINANTLY PMN MODERATE GRAM POSITIVE COCCI RARE GRAM POSITIVE RODS    Culture   Final    FEW STAPHYLOCOCCUS AUREUS CULTURE REINCUBATED FOR BETTER GROWTH FEW STREPTOCOCCUS GROUP C    Report Status PENDING  Incomplete     Studies: Dg Chest 1 View  Result Date: 11/11/2017 CLINICAL DATA:  Central line placement. EXAM: CHEST  1 VIEW COMPARISON:  Chest x-ray dated November 08, 2017. FINDINGS: Interval placement of a left internal jugular dialysis catheter with the tip in the distal SVC. Unchanged positioning of the endotracheal and enteric tubes. Worsening right-sided pleural effusion with near complete opacification of the right hemithorax. There is some aeration within the right upper lobe. No mediastinal shift. Unchanged atelectasis at the left lung base. No definite pneumothorax. No acute osseous abnormality. IMPRESSION: 1. Worsening right-sided pleural effusion with near complete opacification of  the right hemithorax. Recommend chest CT for further evaluation. 2. Interval placement of a left internal jugular dialysis catheter with the tip in the distal SVC. No definite pneumothorax. Electronically Signed   By: Titus Dubin M.D.   On: 11/11/2017 15:47   Ct Chest Wo Contrast  Result Date: 11/11/2017 CLINICAL DATA:  62 year old male with opacified RIGHT hemithorax and respiratory failure. EXAM: CT CHEST WITHOUT CONTRAST TECHNIQUE: Multidetector CT imaging of the chest was performed following the standard protocol without IV  contrast. COMPARISON:  11/11/2017 and prior radiographs FINDINGS: Cardiovascular: Mild cardiomegaly and coronary artery atherosclerotic calcifications noted. Aortic atherosclerotic calcifications identified without aneurysm. No pericardial effusion. Mediastinum/Nodes: Shotty mediastinal lymph nodes are present. An endotracheal tube is present with tip approximately 4 cm above the carina. A LEFT IJ central venous catheter is noted with tip in the mid SVC. An NG tube is noted with tip in the mid stomach. No mediastinal mass identified. Lungs/Pleura: A large possibly loculated RIGHT hydropneumothorax noted. Atelectasis of the majority of the RIGHT lung noted. Airspace disease within the lingula noted. Atelectasis/consolidation of the LEFT LOWER lobe noted. Upper Abdomen: No acute abnormality. Musculoskeletal: No acute or suspicious bony abnormalities. IMPRESSION: 1. Large possibly loculated RIGHT hydropneumothorax which may be related to infection or possibly malignancy. Associated compressive atelectasis of the majority of the RIGHT lung. Clinical team is aware of this per note. 2. Lingular airspace disease likely representing aspiration or pneumonia. LEFT LOWER lobe atelectasis/consolidation. 3. Support apparatus as described 4. Mild cardiomegaly and coronary artery disease 5.  Aortic Atherosclerosis (ICD10-I70.0). Electronically Signed   By: Margarette Canada M.D.   On: 11/11/2017 17:28   Dg Chest Port 1 View  Result Date: 11/13/2017 CLINICAL DATA:  Acute respiratory failure. EXAM: PORTABLE CHEST 1 VIEW COMPARISON:  Yesterday. FINDINGS: The endotracheal tube remains in satisfactory position. Nasogastric tube extending into the stomach. Left jugular catheter tip in the right atrium. Normal sized heart. Stable right pigtail pleural catheter. Moderately large right pleural effusion with increased fluid and resolution of previously demonstrated pleural air. Small amount of subcutaneous emphysema on the right. There is  also ill-defined opacity in the right mid and lower lung zone without significant change. Clear left lung. Unremarkable bones. IMPRESSION: 1. Moderately large right pleural effusion with increased fluid and resolved pleural air. 2. Improving atelectasis in the right mid and lower lung zone. Electronically Signed   By: Claudie Revering M.D.   On: 11/13/2017 07:13   Dg Chest Port 1 View  Result Date: 11/12/2017 CLINICAL DATA:  Central line placement. EXAM: PORTABLE CHEST 1 VIEW COMPARISON:  11/12/2017 at 0435 hours FINDINGS: Endotracheal tube terminates 6.5 cm above the carina. The left jugular catheter has been replaced and its tip is more inferiorly located than the catheter on the prior study, now overlying the cavoatrial junction/high right atrium. Enteric tube terminates over the gastric body with side hole beyond the GE junction. Right pigtail pleural catheter remains in place. Partially loculated right-sided pleural effusion appears smaller than on the prior study. A small loculated right pneumothorax is suspected laterally and in the base corresponding to the areas of decreased pleural fluid. Right mid and lower lung atelectasis persist. Medial left basilar aeration has improved. IMPRESSION: 1. Left jugular catheter terminates over the cavoatrial junction/high right atrium. 2. Decreased size of partially loculated right pleural effusion with pigtail pleural catheter remaining in place. Suspected small loculated right pneumothorax. 3. Persistent right lung atelectasis. 4. Improved left basilar aeration. Electronically Signed   By: Zenia Resides  Jeralyn Ruths M.D.   On: 11/12/2017 18:19   Dg Chest Port 1 View  Result Date: 11/12/2017 CLINICAL DATA:  Respiratory failure EXAM: PORTABLE CHEST 1 VIEW COMPARISON:  November 11, 2017 chest radiograph and chest CT FINDINGS: Endotracheal tube tip is 4.8 cm above the carina. Central catheter tip is in superior vena cava. Nasogastric tube tip and side port are below the diaphragm. There  is now a pigtail catheter in the right hemithorax. There has been significant drainage of right pleural effusion compared to 1 day prior. Moderate loculated effusion remains on the right. There is atelectatic change throughout the right mid and lower lung zones. There is focal consolidation in the medial left base with minimal left pleural effusion. Heart is upper normal in size with pulmonary vascularity within normal limits. No adenopathy. No bone lesions. IMPRESSION: Tube and catheter positions as described without pneumothorax. Pigtail catheter now present on the right with considerable resolution of pleural effusion on the right. Moderate partially loculated pleural effusion remains on the right. There is a minimal pleural effusion on the left. There is atelectatic change throughout right mid lower lung zones. Stable consolidation medial left base. Stable cardiac silhouette. Electronically Signed   By: Lowella Grip III M.D.   On: 11/12/2017 07:33   Ct Image Guided Fluid Drain By Catheter  Result Date: 11/12/2017 INDICATION: Sepsis and hydropneumothorax. Please perform CT-guided placement for infection source control purposes. EXAM: CT IMAGE GUIDED FLUID DRAIN BY CATHETER COMPARISON:  Chest CT - earlier same day MEDICATIONS: The patient is currently admitted to the hospital and receiving intravenous antibiotics. The antibiotics were administered within an appropriate time frame prior to the initiation of the procedure. ANESTHESIA/SEDATION: None, the patient is currently intubated and sedated CONTRAST:  None COMPLICATIONS: None immediate. PROCEDURE: Informed written consent was obtained from the patient's family after a discussion of the risks, benefits and alternatives to treatment. The patient was placed supine on the CT gantry and a pre procedural CT was performed re-demonstrating the known moderate to large sized right-sided hydropneumothorax. The procedure was planned. A timeout was performed prior  to the initiation of the procedure. The skin overlying the right anterior upper chest was prepped and draped in the usual sterile fashion. The overlying soft tissues were anesthetized with 1% lidocaine with epinephrine. Appropriate trajectory was planned with the use of a 22 gauge spinal needle. An 18 gauge trocar needle was advanced into the right pleural space and a short Amplatz super stiff wire was coiled within the collection. Appropriate positioning was confirmed with a limited CT scan. The tract was serially dilated allowing placement of a 12 [place] all-purpose drainage catheter. Appropriate positioning was confirmed with a limited postprocedural CT scan. Approximately 1.8 L of purulent fluid was aspirated. The tube was connected to a pleural vac device and sutured in place. A dressing was placed. Postprocedural imaging demonstrates significant reduction in size of right-sided hydropneumothorax with partial re-expansion of the right lung. The patient tolerated the procedure well without immediate post procedural complication. IMPRESSION: Successful CT guided placement of a 48 French all purpose drain catheter into the right pleural space with aspiration of 1.8 L of purulent fluid. Samples were sent to the laboratory as requested by the ordering clinical team. Electronically Signed   By: Sandi Mariscal M.D.   On: 11/12/2017 07:38    Scheduled Meds: . budesonide (PULMICORT) nebulizer solution  0.25 mg Nebulization BID  . chlorhexidine gluconate (MEDLINE KIT)  15 mL Mouth Rinse BID  . feeding supplement (  PRO-STAT SUGAR FREE 64)  30 mL Per Tube TID  . folic acid  1 mg Per Tube Daily  . heparin injection (subcutaneous)  5,000 Units Subcutaneous Q8H  . insulin aspart  0-9 Units Subcutaneous Q4H  . ipratropium-albuterol  3 mL Nebulization Q4H  . mouth rinse  15 mL Mouth Rinse 10 times per day  . multivitamin  15 mL Per Tube Daily  . pantoprazole sodium  40 mg Per Tube BID  . senna-docusate  2 tablet Per  Tube BID  . thiamine  100 mg Per Tube Daily   Continuous Infusions: . amiodarone 30 mg/hr (11/13/17 0816)  .  ceFAZolin (ANCEF) IV Stopped (11/13/17 1343)  . dextrose 50 mL/hr at 11/13/17 0600  . feeding supplement (VITAL 1.5 CAL) 55 mL/hr at 11/13/17 0600  . fentaNYL infusion INTRAVENOUS 150 mcg/hr (11/13/17 0828)  . small volume/piggyback builder Stopped (11/13/17 0312)  . midazolam (VERSED) infusion 5 mg/hr (11/13/17 0958)  . norepinephrine (LEVOPHED) Adult infusion 5.973 mcg/min (11/13/17 0600)  . phenylephrine (NEO-SYNEPHRINE) Adult infusion Stopped (11/12/17 1617)  . potassium chloride 10 mEq (11/13/17 1409)  . pureflow    . vasopressin (PITRESSIN) infusion - *FOR SHOCK* Stopped (11/13/17 0500)    Assessment/Plan:  1. Delirium tremens and alcohol withdrawal.  Continue current therapy 2. Septic shock with Mssa pneumonia on cefazolin.  Pro-calcitonin elevated.  Continue pressors and IV antibiotic 3. SVT, atrial fibrillation, nonsustained ventricular tachycardia.  Continue amiodarone drip. 4. Acute to subacute stroke with acute encephalopathy and not waking up once off sedation.  Appreciate neurology input 5. Acute hypoxic respiratory failure.  patient on 70% FiO2.    6. Angioedema secondary to lisinopril.  This has improved with supportive care. 7. Acute kidney injury.  Nephrology following continue CRRT  8. Hypernatremia on free water 9. NG tube feeding 10. Hypomagnesemia replaced  Code Status:     Code Status Orders  (From admission, onward)        Start     Ordered   11/20/2017 2322  Full code  Continuous     11/16/2017 2321    Code Status History    This patient has a current code status but no historical code status.     Family Communication: As per critical care specialist  disposition Plan: To be determined  Consultants:  Critical care team  Time spent: 25 minutes Case discussed with critical care specialist.  [facility]

## 2017-11-13 NOTE — Progress Notes (Signed)
Subjective: Patient remains unresponsive  Objective: Current vital signs: BP 109/69   Pulse (!) 104   Temp 98.9 F (37.2 C) (Oral)   Resp (!) 21   Ht 6' 1"  (1.854 m)   Wt 96.8 kg (213 lb 6.5 oz)   SpO2 100%   BMI 28.16 kg/m  Vital signs in last 24 hours: Temp:  [98.2 F (36.8 C)-99.9 F (37.7 C)] 98.9 F (37.2 C) (04/20 0730) Pulse Rate:  [55-105] 104 (04/20 0715) Resp:  [16-28] 21 (04/20 1000) BP: (93-144)/(62-92) 109/69 (04/20 1000) SpO2:  [97 %-100 %] 100 % (04/20 0832) FiO2 (%):  [40 %-65 %] 40 % (04/20 0832) Weight:  [96.8 kg (213 lb 6.5 oz)] 96.8 kg (213 lb 6.5 oz) (04/20 0500)  Intake/Output from previous day: 04/19 0701 - 04/20 0700 In: 4478.8 [I.V.:2614.1; VV/OH:6073.7; IV Piggyback:350] Out: 884 [Urine:264; Chest Tube:620] Intake/Output this shift: Total I/O In: 602.8 [I.V.:262.8; NG/GT:340] Out: 102 [Urine:102] Nutritional status: No diet orders on file  Neurologic Exam: Mental Status: Patient does not respond to verbal stimuli. Does not respond to deep sternal rub. Does not follow commands. No verbalizations noted.  Cranial Nerves: II: patient does not respond confrontation bilaterally, pupils right13m, left 370mand unreactivebilaterally III,IV,VI: doll's response present bilaterally.  V,VII: corneal reflexpresentbilaterally VIII: patient does not respond to verbal stimuli IX,X: gag reflexreduced, XI: trapezius strength unable to test bilaterally XII: tongue strength unable to test Motor: Extremities flaccid throughout. No spontaneous movement noted. No purposeful movements noted. Sensory: Does not respond to noxious stimuli in any extremity.   Lab Results: Basic Metabolic Panel: Recent Labs  Lab 11/12/17 1707 11/12/17 2005 11/13/17 0010 11/13/17 0450 11/13/17 0926  NA 141 140 138 138 136  K 4.1 3.8 3.6 3.5 3.3*  CL 107 107 105 105 103  CO2 24 24 25 25 26   GLUCOSE 192* 185* 158* 161* 156*  BUN 112* 103* 97* 81* 73*   CREATININE 3.63* 3.31* 2.82* 2.47* 2.37*  CALCIUM 7.3* 7.3* 7.4* 7.5* 7.5*  MG 1.9 1.8 1.7 1.7 1.7  PHOS 5.8* 4.5 3.4 2.6 2.5    Liver Function Tests: Recent Labs  Lab 11/08/17 0458  11/12/17 1707 11/12/17 2005 11/13/17 0010 11/13/17 0450 11/13/17 0926  AST 75*  --   --   --   --   --   --   ALT 75*  --   --   --   --   --   --   ALKPHOS 102  --   --   --   --   --   --   BILITOT 1.9*  --   --   --   --   --   --   PROT 5.5*  --   --   --   --   --   --   ALBUMIN 2.2*   < > 1.4* 1.4* 1.4* 1.4* 1.4*   < > = values in this interval not displayed.   No results for input(s): LIPASE, AMYLASE in the last 168 hours. Recent Labs  Lab 11/07/17 1343  AMMONIA 32    CBC: Recent Labs  Lab 11/08/17 0458 11/09/17 0733 11/10/17 0503 11/12/17 0556 11/13/17 0450  WBC 17.6* 24.8* 30.7* 14.1* 15.5*  NEUTROABS 15.6* 23.1* 28.5*  --   --   HGB 11.5* 11.9* 11.6* 10.2* 9.0*  HCT 32.9* 35.1* 34.6* 30.3* 25.7*  MCV 95.5 96.6 94.7 96.3 92.6  PLT 187 219 252 182 193    Cardiac Enzymes: No  results for input(s): CKTOTAL, CKMB, CKMBINDEX, TROPONINI in the last 168 hours.  Lipid Panel: Recent Labs  Lab 11/07/17 1818 11/10/17 1709  TRIG 363* 299*    CBG: Recent Labs  Lab 11/12/17 1749 11/12/17 2018 11/13/17 0014 11/13/17 0444 11/13/17 0725  GLUCAP 179* 160* 149* 151* 159*    Microbiology: Results for orders placed or performed during the hospital encounter of 10/30/2017  MRSA PCR Screening     Status: None   Collection Time: 11/01/2017 11:38 PM  Result Value Ref Range Status   MRSA by PCR NEGATIVE NEGATIVE Final    Comment:        The GeneXpert MRSA Assay (FDA approved for NASAL specimens only), is one component of a comprehensive MRSA colonization surveillance program. It is not intended to diagnose MRSA infection nor to guide or monitor treatment for MRSA infections. Performed at [facility], [address] [place], [place] [address]   Culture,  respiratory (NON-Expectorated)     Status: None   Collection Time: 11/08/17  5:14 AM  Result Value Ref Range Status   Specimen Description   Final    TRACHEAL ASPIRATE Performed at [facility], [address], [place], [place] [address]    Special Requests   Final    NONE Performed at [facility], [address] [place], [place] [address]    Gram Stain   Final    FEW WBC PRESENT, PREDOMINANTLY PMN ABUNDANT GRAM POSITIVE COCCI ABUNDANT GRAM NEGATIVE RODS Performed at [facility], [place] [address] [place], [place] [address]    Culture ABUNDANT STAPHYLOCOCCUS AUREUS  Final   Report Status 11/10/2017 FINAL  Final   Organism ID, Bacteria STAPHYLOCOCCUS AUREUS  Final      Susceptibility   Staphylococcus aureus - MIC*    CIPROFLOXACIN <=0.5 SENSITIVE Sensitive     ERYTHROMYCIN <=0.25 SENSITIVE Sensitive     GENTAMICIN <=0.5 SENSITIVE Sensitive     OXACILLIN 0.5 SENSITIVE Sensitive     TETRACYCLINE <=1 SENSITIVE Sensitive     VANCOMYCIN 1 SENSITIVE Sensitive     TRIMETH/SULFA <=10 SENSITIVE Sensitive     CLINDAMYCIN <=0.25 SENSITIVE Sensitive     RIFAMPIN <=0.5 SENSITIVE Sensitive     Inducible Clindamycin NEGATIVE Sensitive     * ABUNDANT STAPHYLOCOCCUS AUREUS  Urine Culture     Status: Abnormal   Collection Time: 11/10/17  6:34 AM  Result Value Ref Range Status   Specimen Description   Final    URINE, RANDOM Performed at [facility], [address] [place], [place] [address]    Special Requests   Final    NONE Performed at [facility], [address] [place], [place] [address]    Culture (A)  Final    20,000 COLONIES/mL STAPHYLOCOCCUS SPECIES (COAGULASE NEGATIVE)   Report Status 11/12/2017 FINAL  Final   Organism ID, Bacteria STAPHYLOCOCCUS SPECIES (COAGULASE NEGATIVE) (A)  Final      Susceptibility   Staphylococcus species (coagulase negative) - MIC*    CIPROFLOXACIN <=0.5 SENSITIVE Sensitive     GENTAMICIN <=0.5  SENSITIVE Sensitive     NITROFURANTOIN <=16 SENSITIVE Sensitive     OXACILLIN <=0.25 SENSITIVE Sensitive     TETRACYCLINE <=1 SENSITIVE Sensitive     VANCOMYCIN <=0.5 SENSITIVE Sensitive     TRIMETH/SULFA <=10 SENSITIVE Sensitive     CLINDAMYCIN 0.5 SENSITIVE Sensitive     RIFAMPIN <=0.5 SENSITIVE Sensitive     Inducible Clindamycin NEGATIVE Sensitive     *  20,000 COLONIES/mL STAPHYLOCOCCUS SPECIES (COAGULASE NEGATIVE)  CULTURE, BLOOD (ROUTINE X 2) w Reflex to ID Panel     Status: None (Preliminary result)   Collection Time: 11/10/17  8:15 AM  Result Value Ref Range Status   Specimen Description BLOOD BLOOD LEFT HAND  Final   Special Requests   Final    BOTTLES DRAWN AEROBIC AND ANAEROBIC Blood Culture adequate volume   Culture   Final    NO GROWTH 3 DAYS Performed at [facility], [address] [place], [place] [address]    Report Status PENDING  Incomplete  CULTURE, BLOOD (ROUTINE X 2) w Reflex to ID Panel     Status: None (Preliminary result)   Collection Time: 11/10/17  8:25 AM  Result Value Ref Range Status   Specimen Description BLOOD BLOOD RIGHT HAND  Final   Special Requests   Final    BOTTLES DRAWN AEROBIC AND ANAEROBIC Blood Culture adequate volume   Culture   Final    NO GROWTH 3 DAYS Performed at [facility], [address] [place], [place] [address]    Report Status PENDING  Incomplete  Culture, respiratory (NON-Expectorated)     Status: None   Collection Time: 11/10/17 11:13 AM  Result Value Ref Range Status   Specimen Description   Final    TRACHEAL ASPIRATE Performed at [facility], [address], [place], [place] [address]    Special Requests   Final    NONE Performed at [facility], [address] [place], [place] [address]    Gram Stain   Final    RARE WBC PRESENT, PREDOMINANTLY PMN RARE GRAM NEGATIVE COCCOBACILLI Performed at [facility], [place] [address], [place], [place] [address]    Culture  MODERATE STAPHYLOCOCCUS AUREUS  Final   Report Status 11/12/2017 FINAL  Final   Organism ID, Bacteria STAPHYLOCOCCUS AUREUS  Final      Susceptibility   Staphylococcus aureus - MIC*    CIPROFLOXACIN <=0.5 SENSITIVE Sensitive     ERYTHROMYCIN <=0.25 SENSITIVE Sensitive     GENTAMICIN <=0.5 SENSITIVE Sensitive     OXACILLIN 0.5 SENSITIVE Sensitive     TETRACYCLINE <=1 SENSITIVE Sensitive     VANCOMYCIN <=0.5 SENSITIVE Sensitive     TRIMETH/SULFA <=10 SENSITIVE Sensitive     CLINDAMYCIN <=0.25 SENSITIVE Sensitive     RIFAMPIN <=0.5 SENSITIVE Sensitive     Inducible Clindamycin NEGATIVE Sensitive     * MODERATE STAPHYLOCOCCUS AUREUS  Aerobic/Anaerobic Culture (surgical/deep wound)     Status: None (Preliminary result)   Collection Time: 11/11/17  6:01 PM  Result Value Ref Range Status   Specimen Description   Final    WOUND Performed at [facility], [address], [place], [place] [address]    Special Requests   Final    Normal Performed at [facility], [address] [place], [place] [address]    Gram Stain   Final    ABUNDANT WBC PRESENT, PREDOMINANTLY PMN MODERATE GRAM POSITIVE COCCI RARE GRAM POSITIVE RODS    Culture   Final    FEW STAPHYLOCOCCUS AUREUS CULTURE REINCUBATED FOR BETTER GROWTH FEW UNIDENTIFIED ORGANISM Performed at [facility], [place] [address] [place], [place] [address]    Report Status PENDING  Incomplete    Coagulation Studies: No results for input(s): LABPROT, INR in the last 72 hours.  Imaging: Dg Chest 1 View  Result Date: 11/11/2017 CLINICAL DATA:  Central line placement. EXAM: CHEST  1 VIEW COMPARISON:  Chest x-ray dated November 08, 2017. FINDINGS: Interval placement of a left internal jugular dialysis catheter with the tip in the distal SVC. Unchanged positioning of the endotracheal and enteric tubes. Worsening right-sided pleural effusion with near complete opacification of the right hemithorax. There is some aeration  within the right upper lobe. No mediastinal shift. Unchanged atelectasis at the left lung base. No definite pneumothorax. No acute osseous abnormality. IMPRESSION: 1. Worsening right-sided pleural effusion with near complete opacification of the right hemithorax. Recommend chest CT for further evaluation. 2. Interval placement of a left internal jugular dialysis catheter with the tip in the distal SVC. No definite pneumothorax. Electronically Signed   By: Titus Dubin M.D.   On: 11/11/2017 15:47   Ct Chest Wo Contrast  Result Date: 11/11/2017 CLINICAL DATA:  62 year old male with opacified RIGHT hemithorax and respiratory failure. EXAM: CT CHEST WITHOUT CONTRAST TECHNIQUE: Multidetector CT imaging of the chest was performed following the standard protocol without IV contrast. COMPARISON:  11/11/2017 and prior radiographs FINDINGS: Cardiovascular: Mild cardiomegaly and coronary artery atherosclerotic calcifications noted. Aortic atherosclerotic calcifications identified without aneurysm. No pericardial effusion. Mediastinum/Nodes: Shotty mediastinal lymph nodes are present. An endotracheal tube is present with tip approximately 4 cm above the carina. A LEFT IJ central venous catheter is noted with tip in the mid SVC. An NG tube is noted with tip in the mid stomach. No mediastinal mass identified. Lungs/Pleura: A large possibly loculated RIGHT hydropneumothorax noted. Atelectasis of the majority of the RIGHT lung noted. Airspace disease within the lingula noted. Atelectasis/consolidation of the LEFT LOWER lobe noted. Upper Abdomen: No acute abnormality. Musculoskeletal: No acute or suspicious bony abnormalities. IMPRESSION: 1. Large possibly loculated RIGHT hydropneumothorax which may be related to infection or possibly malignancy. Associated compressive atelectasis of the majority of the RIGHT lung. Clinical team is aware of this per note. 2. Lingular airspace disease likely representing aspiration or  pneumonia. LEFT LOWER lobe atelectasis/consolidation. 3. Support apparatus as described 4. Mild cardiomegaly and coronary artery disease 5.  Aortic Atherosclerosis (ICD10-I70.0). Electronically Signed   By: Margarette Canada M.D.   On: 11/11/2017 17:28   Dg Chest Port 1 View  Result Date: 11/13/2017 CLINICAL DATA:  Acute respiratory failure. EXAM: PORTABLE CHEST 1 VIEW COMPARISON:  Yesterday. FINDINGS: The endotracheal tube remains in satisfactory position. Nasogastric tube extending into the stomach. Left jugular catheter tip in the right atrium. Normal sized heart. Stable right pigtail pleural catheter. Moderately large right pleural effusion with increased fluid and resolution of previously demonstrated pleural air. Small amount of subcutaneous emphysema on the right. There is also ill-defined opacity in the right mid and lower lung zone without significant change. Clear left lung. Unremarkable bones. IMPRESSION: 1. Moderately large right pleural effusion with increased fluid and resolved pleural air. 2. Improving atelectasis in the right mid and lower lung zone. Electronically Signed   By: Claudie Revering M.D.   On: 11/13/2017 07:13   Dg Chest Port 1 View  Result Date: 11/12/2017 CLINICAL DATA:  Central line placement. EXAM: PORTABLE CHEST 1 VIEW COMPARISON:  11/12/2017 at 0435 hours FINDINGS: Endotracheal tube terminates 6.5 cm above the carina. The left jugular catheter has been replaced and its tip is more inferiorly located than the catheter on the prior study, now overlying the cavoatrial junction/high right atrium. Enteric tube terminates over the gastric body with side hole beyond the GE junction. Right pigtail pleural catheter remains in place. Partially loculated right-sided pleural effusion appears smaller than on the  prior study. A small loculated right pneumothorax is suspected laterally and in the base corresponding to the areas of decreased pleural fluid. Right mid and lower lung atelectasis  persist. Medial left basilar aeration has improved. IMPRESSION: 1. Left jugular catheter terminates over the cavoatrial junction/high right atrium. 2. Decreased size of partially loculated right pleural effusion with pigtail pleural catheter remaining in place. Suspected small loculated right pneumothorax. 3. Persistent right lung atelectasis. 4. Improved left basilar aeration. Electronically Signed   By: Logan Bores M.D.   On: 11/12/2017 18:19   Dg Chest Port 1 View  Result Date: 11/12/2017 CLINICAL DATA:  Respiratory failure EXAM: PORTABLE CHEST 1 VIEW COMPARISON:  November 11, 2017 chest radiograph and chest CT FINDINGS: Endotracheal tube tip is 4.8 cm above the carina. Central catheter tip is in superior vena cava. Nasogastric tube tip and side port are below the diaphragm. There is now a pigtail catheter in the right hemithorax. There has been significant drainage of right pleural effusion compared to 1 day prior. Moderate loculated effusion remains on the right. There is atelectatic change throughout the right mid and lower lung zones. There is focal consolidation in the medial left base with minimal left pleural effusion. Heart is upper normal in size with pulmonary vascularity within normal limits. No adenopathy. No bone lesions. IMPRESSION: Tube and catheter positions as described without pneumothorax. Pigtail catheter now present on the right with considerable resolution of pleural effusion on the right. Moderate partially loculated pleural effusion remains on the right. There is a minimal pleural effusion on the left. There is atelectatic change throughout right mid lower lung zones. Stable consolidation medial left base. Stable cardiac silhouette. Electronically Signed   By: Lowella Grip III M.D.   On: 11/12/2017 07:33   Ct Image Guided Fluid Drain By Catheter  Result Date: 11/12/2017 INDICATION: Sepsis and hydropneumothorax. Please perform CT-guided placement for infection source control  purposes. EXAM: CT IMAGE GUIDED FLUID DRAIN BY CATHETER COMPARISON:  Chest CT - earlier same day MEDICATIONS: The patient is currently admitted to the hospital and receiving intravenous antibiotics. The antibiotics were administered within an appropriate time frame prior to the initiation of the procedure. ANESTHESIA/SEDATION: None, the patient is currently intubated and sedated CONTRAST:  None COMPLICATIONS: None immediate. PROCEDURE: Informed written consent was obtained from the patient's family after a discussion of the risks, benefits and alternatives to treatment. The patient was placed supine on the CT gantry and a pre procedural CT was performed re-demonstrating the known moderate to large sized right-sided hydropneumothorax. The procedure was planned. A timeout was performed prior to the initiation of the procedure. The skin overlying the right anterior upper chest was prepped and draped in the usual sterile fashion. The overlying soft tissues were anesthetized with 1% lidocaine with epinephrine. Appropriate trajectory was planned with the use of a 22 gauge spinal needle. An 18 gauge trocar needle was advanced into the right pleural space and a short Amplatz super stiff wire was coiled within the collection. Appropriate positioning was confirmed with a limited CT scan. The tract was serially dilated allowing placement of a 12 [place] all-purpose drainage catheter. Appropriate positioning was confirmed with a limited postprocedural CT scan. Approximately 1.8 L of purulent fluid was aspirated. The tube was connected to a pleural vac device and sutured in place. A dressing was placed. Postprocedural imaging demonstrates significant reduction in size of right-sided hydropneumothorax with partial re-expansion of the right lung. The patient tolerated the procedure well without immediate post procedural  complication. IMPRESSION: Successful CT guided placement of a 39 French all purpose drain catheter into the right  pleural space with aspiration of 1.8 L of purulent fluid. Samples were sent to the laboratory as requested by the ordering clinical team. Electronically Signed   By: Sandi Mariscal M.D.   On: 11/12/2017 07:38    Medications:  I have reviewed the patient's current medications. Scheduled: . budesonide (PULMICORT) nebulizer solution  0.25 mg Nebulization BID  . chlorhexidine gluconate (MEDLINE KIT)  15 mL Mouth Rinse BID  . feeding supplement (PRO-STAT SUGAR FREE 64)  30 mL Per Tube TID  . folic acid  1 mg Per Tube Daily  . heparin injection (subcutaneous)  5,000 Units Subcutaneous Q8H  . insulin aspart  0-9 Units Subcutaneous Q4H  . ipratropium-albuterol  3 mL Nebulization Q4H  . mouth rinse  15 mL Mouth Rinse 10 times per day  . multivitamin  15 mL Per Tube Daily  . pantoprazole sodium  40 mg Per Tube BID  . senna-docusate  2 tablet Per Tube BID  . thiamine  100 mg Per Tube Daily    Assessment/Plan: Patient remains unresponsive.  Continues with CRRT.  Renal function improving.  Will continue to follow with you.     LOS: 13 days   Alexis Goodell, MD Neurology [phone] 11/13/2017  11:58 AM

## 2017-11-14 ENCOUNTER — Inpatient Hospital Stay: Payer: Medicare Other

## 2017-11-14 LAB — RENAL FUNCTION PANEL
ALBUMIN: 1.2 g/dL — AB (ref 3.5–5.0)
ALBUMIN: 1.3 g/dL — AB (ref 3.5–5.0)
ANION GAP: 5 (ref 5–15)
ANION GAP: 5 (ref 5–15)
Albumin: 1.3 g/dL — ABNORMAL LOW (ref 3.5–5.0)
Albumin: 1.3 g/dL — ABNORMAL LOW (ref 3.5–5.0)
Albumin: 1.4 g/dL — ABNORMAL LOW (ref 3.5–5.0)
Albumin: 1.3 g/dL — ABNORMAL LOW (ref 3.5–5.0)
Anion gap: 5 (ref 5–15)
Anion gap: 6 (ref 5–15)
Anion gap: 4 — ABNORMAL LOW (ref 5–15)
Anion gap: 6 (ref 5–15)
BUN: 54 mg/dL — ABNORMAL HIGH (ref 6–20)
BUN: 49 mg/dL — AB (ref 6–20)
BUN: 47 mg/dL — AB (ref 6–20)
BUN: 43 mg/dL — AB (ref 6–20)
BUN: 41 mg/dL — ABNORMAL HIGH (ref 6–20)
BUN: 38 mg/dL — ABNORMAL HIGH (ref 6–20)
CALCIUM: 7.2 mg/dL — AB (ref 8.9–10.3)
CALCIUM: 7.6 mg/dL — AB (ref 8.9–10.3)
CHLORIDE: 102 mmol/L (ref 101–111)
CHLORIDE: 101 mmol/L (ref 101–111)
CO2: 27 mmol/L (ref 22–32)
CO2: 26 mmol/L (ref 22–32)
CO2: 26 mmol/L (ref 22–32)
CO2: 28 mmol/L (ref 22–32)
CO2: 28 mmol/L (ref 22–32)
CO2: 28 mmol/L (ref 22–32)
CREATININE: 1.78 mg/dL — AB (ref 0.61–1.24)
CREATININE: 1.84 mg/dL — AB (ref 0.61–1.24)
CREATININE: 1.86 mg/dL — AB (ref 0.61–1.24)
CREATININE: 1.75 mg/dL — AB (ref 0.61–1.24)
Calcium: 7.4 mg/dL — ABNORMAL LOW (ref 8.9–10.3)
Calcium: 7.6 mg/dL — ABNORMAL LOW (ref 8.9–10.3)
Calcium: 7.6 mg/dL — ABNORMAL LOW (ref 8.9–10.3)
Calcium: 7.6 mg/dL — ABNORMAL LOW (ref 8.9–10.3)
Chloride: 104 mmol/L (ref 101–111)
Chloride: 101 mmol/L (ref 101–111)
Chloride: 103 mmol/L (ref 101–111)
Chloride: 102 mmol/L (ref 101–111)
Creatinine, Ser: 1.93 mg/dL — ABNORMAL HIGH (ref 0.61–1.24)
Creatinine, Ser: 1.77 mg/dL — ABNORMAL HIGH (ref 0.61–1.24)
GFR calc Af Amer: 41 mL/min — ABNORMAL LOW (ref >60)
GFR calc Af Amer: 45 mL/min — ABNORMAL LOW (ref >60)
GFR calc Af Amer: 43 mL/min — ABNORMAL LOW (ref >60)
GFR calc Af Amer: 43 mL/min — ABNORMAL LOW (ref >60)
GFR calc non Af Amer: 35 mL/min — ABNORMAL LOW (ref >60)
GFR calc non Af Amer: 37 mL/min — ABNORMAL LOW (ref >60)
GFR calc non Af Amer: 37 mL/min — ABNORMAL LOW (ref >60)
GFR calc non Af Amer: 39 mL/min — ABNORMAL LOW (ref >60)
GFR, EST AFRICAN AMERICAN: 45 mL/min — AB (ref >60)
GFR, EST AFRICAN AMERICAN: 46 mL/min — AB (ref >60)
GFR, EST NON AFRICAN AMERICAN: 39 mL/min — AB (ref >60)
GFR, EST NON AFRICAN AMERICAN: 40 mL/min — AB (ref >60)
GLUCOSE: 125 mg/dL — AB (ref 65–99)
GLUCOSE: 115 mg/dL — AB (ref 65–99)
Glucose, Bld: 127 mg/dL — ABNORMAL HIGH (ref 65–99)
Glucose, Bld: 134 mg/dL — ABNORMAL HIGH (ref 65–99)
Glucose, Bld: 113 mg/dL — ABNORMAL HIGH (ref 65–99)
Glucose, Bld: 111 mg/dL — ABNORMAL HIGH (ref 65–99)
PHOSPHORUS: 2 mg/dL — AB (ref 2.5–4.6)
PHOSPHORUS: 2.2 mg/dL — AB (ref 2.5–4.6)
PHOSPHORUS: 3.7 mg/dL (ref 2.5–4.6)
POTASSIUM: 3.5 mmol/L (ref 3.5–5.1)
POTASSIUM: 3.6 mmol/L (ref 3.5–5.1)
POTASSIUM: 4.1 mmol/L (ref 3.5–5.1)
POTASSIUM: 4.1 mmol/L (ref 3.5–5.1)
Phosphorus: 2.5 mg/dL (ref 2.5–4.6)
Phosphorus: 2.5 mg/dL (ref 2.5–4.6)
Phosphorus: 2.7 mg/dL (ref 2.5–4.6)
Potassium: 3.9 mmol/L (ref 3.5–5.1)
Potassium: 4.3 mmol/L (ref 3.5–5.1)
Sodium: 136 mmol/L (ref 135–145)
Sodium: 132 mmol/L — ABNORMAL LOW (ref 135–145)
Sodium: 135 mmol/L (ref 135–145)
Sodium: 134 mmol/L — ABNORMAL LOW (ref 135–145)
Sodium: 135 mmol/L (ref 135–145)
Sodium: 135 mmol/L (ref 135–145)

## 2017-11-14 LAB — CBC
HCT: 23.3 % — ABNORMAL LOW (ref 40.0–52.0)
Hemoglobin: 7.8 g/dL — ABNORMAL LOW (ref 13.0–18.0)
MCH: 31.5 pg (ref 26.0–34.0)
MCHC: 33.4 g/dL (ref 32.0–36.0)
MCV: 94.5 fL (ref 80.0–100.0)
PLATELETS: 168 10*3/uL (ref 150–440)
RBC: 2.47 MIL/uL — ABNORMAL LOW (ref 4.40–5.90)
RDW: 14.9 % — AB (ref 11.5–14.5)
WBC: 13.7 10*3/uL — ABNORMAL HIGH (ref 3.8–10.6)

## 2017-11-14 LAB — MAGNESIUM
MAGNESIUM: 1.6 mg/dL — AB (ref 1.7–2.4)
MAGNESIUM: 2.1 mg/dL (ref 1.7–2.4)
MAGNESIUM: 1.7 mg/dL (ref 1.7–2.4)
MAGNESIUM: 1.7 mg/dL (ref 1.7–2.4)
Magnesium: 1.9 mg/dL (ref 1.7–2.4)
Magnesium: 1.8 mg/dL (ref 1.7–2.4)

## 2017-11-14 LAB — GLUCOSE, CAPILLARY
GLUCOSE-CAPILLARY: 120 mg/dL — AB (ref 65–99)
GLUCOSE-CAPILLARY: 112 mg/dL — AB (ref 65–99)
Glucose-Capillary: 103 mg/dL — ABNORMAL HIGH (ref 65–99)
Glucose-Capillary: 103 mg/dL — ABNORMAL HIGH (ref 65–99)
Glucose-Capillary: 127 mg/dL — ABNORMAL HIGH (ref 65–99)
Glucose-Capillary: 99 mg/dL (ref 65–99)

## 2017-11-14 LAB — TRIGLYCERIDES: Triglycerides: 329 mg/dL — ABNORMAL HIGH (ref <150)

## 2017-11-14 MED ORDER — STERILE WATER FOR INJECTION IJ SOLN
5.0000 mg | Freq: Two times a day (BID) | RESPIRATORY_TRACT | Status: DC
  Administered 2017-11-14 – 2017-11-15 (×3): 5 mg via INTRAPLEURAL
  Filled 2017-11-14 (×6): qty 5

## 2017-11-14 MED ORDER — MAGNESIUM SULFATE 2 GM/50ML IV SOLN
2.0000 g | Freq: Once | INTRAVENOUS | Status: AC
  Administered 2017-11-15: 2 g via INTRAVENOUS
  Filled 2017-11-14: qty 50

## 2017-11-14 MED ORDER — PROPOFOL 1000 MG/100ML IV EMUL
0.0000 ug/kg/min | INTRAVENOUS | Status: DC
  Administered 2017-11-14: 30 ug/kg/min via INTRAVENOUS
  Administered 2017-11-14 – 2017-11-15 (×4): 50 ug/kg/min via INTRAVENOUS
  Filled 2017-11-14 (×6): qty 100

## 2017-11-14 MED ORDER — LORAZEPAM 2 MG/ML IJ SOLN: 2.0000 mg | Freq: Once | INTRAMUSCULAR | Status: DC

## 2017-11-14 MED ORDER — POTASSIUM PHOSPHATES 15 MMOLE/5ML IV SOLN
15.0000 mmol | Freq: Once | INTRAVENOUS | Status: AC
  Administered 2017-11-14: 15 mmol via INTRAVENOUS
  Filled 2017-11-14: qty 5

## 2017-11-14 MED ORDER — MAGNESIUM SULFATE 2 GM/50ML IV SOLN
2.0000 g | Freq: Once | INTRAVENOUS | Status: AC
  Administered 2017-11-14: 2 g via INTRAVENOUS
  Filled 2017-11-14: qty 50

## 2017-11-14 MED ORDER — SODIUM CHLORIDE 0.9 % IJ SOLN
10.0000 mg | Freq: Two times a day (BID) | INTRAMUSCULAR | Status: DC
  Administered 2017-11-14 – 2017-11-15 (×3): 10 mg via INTRAPLEURAL
  Filled 2017-11-14 (×4): qty 10
  Filled 2017-11-14: qty 6
  Filled 2017-11-14 (×2): qty 10

## 2017-11-14 NOTE — Progress Notes (Signed)
[facility]  ROUNDING NOTE   Subjective:  Patient remains critically ill at the moment. He remains oliguric. Still on the ventilator. Hypotension persists as well with blood pressure 93/57.   Objective:  Vital signs in last 24 hours:  Temp:  [98.5 F (36.9 C)-100.9 F (38.3 C)] 98.9 F (37.2 C) (04/21 0810) Pulse Rate:  [87-101] 95 (04/21 0810) Resp:  [12-23] 15 (04/21 0810) BP: (84-152)/(54-85) 93/57 (04/21 0800) SpO2:  [94 %-100 %] 94 % (04/21 1130) FiO2 (%):  [35 %-40 %] 35 % (04/21 1130) Weight:  [95.9 kg (211 lb 6.7 oz)] 95.9 kg (211 lb 6.7 oz) (04/21 0305)  Weight change: -0.9 kg (-1 lb 15.7 oz) Filed Weights   11/12/17 0500 11/13/17 0500 11/14/17 0305  Weight: 96.5 kg (212 lb 11.9 oz) 96.8 kg (213 lb 6.5 oz) 95.9 kg (211 lb 6.7 oz)    Intake/Output: I/O last 3 completed shifts: In: 5695.9 [I.V.:2859.2; NG/GT:2136.7; IV Piggyback:700] Out: 1122 [Urine:562; Chest Tube:560]   Intake/Output this shift:  Total I/O In: 444.4 [I.V.:79.4; NG/GT:110; IV Piggyback:255] Out: 535 [Urine:35; Chest Tube:500]  Physical Exam: General: Critically ill appearing  Head: ETT/NG in place  Eyes: Anicteric  Neck: Supple, trachea midline  Lungs:  Scattered rhonchi, vent assisted  Heart: S1S2 no rubs  Abdomen:  Soft, nontender, bowel sounds present  Extremities: 1+ peripheral edema.  Neurologic: Intubated, not following commands  Skin: No lesions       Basic Metabolic Panel: Recent Labs  Lab 11/13/17 1737 11/13/17 2136 11/14/17 0115 11/14/17 0537 11/14/17 0625 11/14/17 0950  NA 136 135 136 132*  --  135  K 3.7 3.6 3.5 3.6  --  3.9  CL 104 103 104 101  --  103  CO2 _0 --  26  GLUCOSE 150* 128* 127* 134*  --  125*  BUN 65* 59* 54* 49*  --  47*  CREATININE 2.15* 2.04* 1.93* 1.78*  --  1.84*  CALCIUM 7.5* 7.5* 7.4* 7.2*  --  7.6*  MG 1.7 1.7 1.6*  --  2.1 1.9  PHOS 2.3* 2.2* 2.0* 2.2*  --  2.5    Liver Function Tests: Recent Labs  Lab  11/08/17 0458  11/13/17 1737 11/13/17 2136 11/14/17 0115 11/14/17 0537 11/14/17 0950  AST 75*  --   --   --   --   --   --   ALT 75*  --   --   --   --   --   --   ALKPHOS 102  --   --   --   --   --   --   BILITOT 1.9*  --   --   --   --   --   --   PROT 5.5*  --   --   --   --   --   --   ALBUMIN 2.2*   < > 1.3* 1.3* 1.3* 1.2* 1.3*   < > = values in this interval not displayed.   No results for input(s): LIPASE, AMYLASE in the last 168 hours. Recent Labs  Lab 11/07/17 1343  AMMONIA 32    CBC: Recent Labs  Lab 11/08/17 0458 11/09/17 0733 11/10/17 0503 11/12/17 0556 11/13/17 0450 11/14/17 0537  WBC 17.6* 24.8* 30.7* 14.1* 15.5* 13.7*  NEUTROABS 15.6* 23.1* 28.5*  --   --   --   HGB 11.5* 11.9* 11.6* 10.2* 9.0* 7.8*  HCT 32.9* 35.1* 34.6* 30.3* 25.7*  23.3*  MCV 95.5 96.6 94.7 96.3 92.6 94.5  PLT 187 219 252 182 193 168    Cardiac Enzymes: No results for input(s): CKTOTAL, CKMB, CKMBINDEX, TROPONINI in the last 168 hours.  BNP: Invalid input(s): POCBNP  CBG: Recent Labs  Lab 11/13/17 1725 11/13/17 2026 11/14/17 0010 11/14/17 0354 11/14/17 0818  GLUCAP 145* 124* 103* 120* 99    Microbiology: Results for orders placed or performed during the hospital encounter of 11/12/2017  MRSA PCR Screening     Status: None   Collection Time: 11/11/2017 11:38 PM  Result Value Ref Range Status   MRSA by PCR NEGATIVE NEGATIVE Final    Comment:        The GeneXpert MRSA Assay (FDA approved for NASAL specimens only), is one component of a comprehensive MRSA colonization surveillance program. It is not intended to diagnose MRSA infection nor to guide or monitor treatment for MRSA infections. Performed at [facility], [address] [place], [place] [address]   Culture, respiratory (NON-Expectorated)     Status: None   Collection Time: 11/08/17  5:14 AM  Result Value Ref Range Status   Specimen Description   Final    TRACHEAL ASPIRATE Performed at  [facility], [address], [place], [place] [address]    Special Requests   Final    NONE Performed at [facility], [address] [place], [place] [address]    Gram Stain   Final    FEW WBC PRESENT, PREDOMINANTLY PMN ABUNDANT GRAM POSITIVE COCCI ABUNDANT GRAM NEGATIVE RODS Performed at [facility], [place] [address] [place], [place] [address]    Culture ABUNDANT STAPHYLOCOCCUS AUREUS  Final   Report Status 11/10/2017 FINAL  Final   Organism ID, Bacteria STAPHYLOCOCCUS AUREUS  Final      Susceptibility   Staphylococcus aureus - MIC*    CIPROFLOXACIN <=0.5 SENSITIVE Sensitive     ERYTHROMYCIN <=0.25 SENSITIVE Sensitive     GENTAMICIN <=0.5 SENSITIVE Sensitive     OXACILLIN 0.5 SENSITIVE Sensitive     TETRACYCLINE <=1 SENSITIVE Sensitive     VANCOMYCIN 1 SENSITIVE Sensitive     TRIMETH/SULFA <=10 SENSITIVE Sensitive     CLINDAMYCIN <=0.25 SENSITIVE Sensitive     RIFAMPIN <=0.5 SENSITIVE Sensitive     Inducible Clindamycin NEGATIVE Sensitive     * ABUNDANT STAPHYLOCOCCUS AUREUS  Urine Culture     Status: Abnormal   Collection Time: 11/10/17  6:34 AM  Result Value Ref Range Status   Specimen Description   Final    URINE, RANDOM Performed at [facility], [address], [place], [place] [address]    Special Requests   Final    NONE Performed at [facility], [address] [place], [place] [address]    Culture (A)  Final    20,000 COLONIES/mL STAPHYLOCOCCUS SPECIES (COAGULASE NEGATIVE)   Report Status 11/12/2017 FINAL  Final   Organism ID, Bacteria STAPHYLOCOCCUS SPECIES (COAGULASE NEGATIVE) (A)  Final      Susceptibility   Staphylococcus species (coagulase negative) - MIC*    CIPROFLOXACIN <=0.5 SENSITIVE Sensitive     GENTAMICIN <=0.5 SENSITIVE Sensitive     NITROFURANTOIN <=16 SENSITIVE Sensitive     OXACILLIN <=0.25 SENSITIVE Sensitive     TETRACYCLINE <=1 SENSITIVE Sensitive     VANCOMYCIN <=0.5 SENSITIVE Sensitive      TRIMETH/SULFA <=10 SENSITIVE Sensitive     CLINDAMYCIN 0.5 SENSITIVE Sensitive     RIFAMPIN <=0.5 SENSITIVE Sensitive  Inducible Clindamycin NEGATIVE Sensitive     * 20,000 COLONIES/mL STAPHYLOCOCCUS SPECIES (COAGULASE NEGATIVE)  CULTURE, BLOOD (ROUTINE X 2) w Reflex to ID Panel     Status: None (Preliminary result)   Collection Time: 11/10/17  8:15 AM  Result Value Ref Range Status   Specimen Description BLOOD BLOOD LEFT HAND  Final   Special Requests   Final    BOTTLES DRAWN AEROBIC AND ANAEROBIC Blood Culture adequate volume   Culture   Final    NO GROWTH 4 DAYS Performed at [facility], [address] [place], [place] [address]    Report Status PENDING  Incomplete  CULTURE, BLOOD (ROUTINE X 2) w Reflex to ID Panel     Status: None (Preliminary result)   Collection Time: 11/10/17  8:25 AM  Result Value Ref Range Status   Specimen Description BLOOD BLOOD RIGHT HAND  Final   Special Requests   Final    BOTTLES DRAWN AEROBIC AND ANAEROBIC Blood Culture adequate volume   Culture   Final    NO GROWTH 4 DAYS Performed at [facility], [address], [place], [place] [address]    Report Status PENDING  Incomplete  Culture, respiratory (NON-Expectorated)     Status: None   Collection Time: 11/10/17 11:13 AM  Result Value Ref Range Status   Specimen Description   Final    TRACHEAL ASPIRATE Performed at [facility], [address], [place], [place] [address]    Special Requests   Final    NONE Performed at [facility], [address] [place], [place] [address]    Gram Stain   Final    RARE WBC PRESENT, PREDOMINANTLY PMN RARE GRAM NEGATIVE COCCOBACILLI Performed at [facility], [place] [address] [place], [place] [address]    Culture MODERATE STAPHYLOCOCCUS AUREUS  Final   Report Status 11/12/2017 FINAL  Final   Organism ID, Bacteria STAPHYLOCOCCUS AUREUS  Final      Susceptibility   Staphylococcus aureus - MIC*     CIPROFLOXACIN <=0.5 SENSITIVE Sensitive     ERYTHROMYCIN <=0.25 SENSITIVE Sensitive     GENTAMICIN <=0.5 SENSITIVE Sensitive     OXACILLIN 0.5 SENSITIVE Sensitive     TETRACYCLINE <=1 SENSITIVE Sensitive     VANCOMYCIN <=0.5 SENSITIVE Sensitive     TRIMETH/SULFA <=10 SENSITIVE Sensitive     CLINDAMYCIN <=0.25 SENSITIVE Sensitive     RIFAMPIN <=0.5 SENSITIVE Sensitive     Inducible Clindamycin NEGATIVE Sensitive     * MODERATE STAPHYLOCOCCUS AUREUS  Aerobic/Anaerobic Culture (surgical/deep wound)     Status: None (Preliminary result)   Collection Time: 11/11/17  6:01 PM  Result Value Ref Range Status   Specimen Description   Final    WOUND Performed at [facility], [address] [place], [place] [address]    Special Requests   Final    Normal Performed at [facility], [address] [place], [place] [address]    Gram Stain   Final    ABUNDANT WBC PRESENT, PREDOMINANTLY PMN MODERATE GRAM POSITIVE COCCI RARE GRAM POSITIVE RODS    Culture   Final    FEW STAPHYLOCOCCUS AUREUS SUSCEPTIBILITIES TO FOLLOW FEW STREPTOCOCCUS GROUP C HOLDING FOR POSSIBLE ANAEROBE Performed at [facility], [place] [address], [place],  [address]    Report Status PENDING  Incomplete    Coagulation Studies: No results for input(s): LABPROT, INR in the last 72 hours.  Urinalysis: No results for input(s):  COLORURINE, LABSPEC, [place], GLUCOSEU, HGBUR, BILIRUBINUR, KETONESUR, PROTEINUR, UROBILINOGEN, NITRITE, LEUKOCYTESUR in the last 72 hours.  Invalid input(s): APPERANCEUR    Imaging: Dg Chest Port 1 View  Result Date: 11/14/2017 CLINICAL DATA:  Acute respiratory failure. EXAM: PORTABLE CHEST 1 VIEW COMPARISON:  11/13/2017 FINDINGS: Endotracheal tube terminates 3 cm above the carina. Left jugular catheter terminates over the high right atrium. Enteric tube courses into the left upper abdomen with tip not imaged. Right pigtail pleural catheter remains in place. A  large, partially loculated right pleural effusion has further increased in size with worsening right lung aeration, particularly in the base. The left lung remains grossly clear. The left costophrenic angle was incompletely imaged. No pneumothorax is identified. IMPRESSION: Increasing size of large right pleural effusion with worsening right lung aeration. Electronically Signed   By: Logan Bores M.D.   On: 11/14/2017 07:44   Dg Chest Port 1 View  Result Date: 11/13/2017 CLINICAL DATA:  Acute respiratory failure. EXAM: PORTABLE CHEST 1 VIEW COMPARISON:  Yesterday. FINDINGS: The endotracheal tube remains in satisfactory position. Nasogastric tube extending into the stomach. Left jugular catheter tip in the right atrium. Normal sized heart. Stable right pigtail pleural catheter. Moderately large right pleural effusion with increased fluid and resolution of previously demonstrated pleural air. Small amount of subcutaneous emphysema on the right. There is also ill-defined opacity in the right mid and lower lung zone without significant change. Clear left lung. Unremarkable bones. IMPRESSION: 1. Moderately large right pleural effusion with increased fluid and resolved pleural air. 2. Improving atelectasis in the right mid and lower lung zone. Electronically Signed   By: Claudie Revering M.D.   On: 11/13/2017 07:13   Dg Chest Port 1 View  Result Date: 11/12/2017 CLINICAL DATA:  Central line placement. EXAM: PORTABLE CHEST 1 VIEW COMPARISON:  11/12/2017 at 0435 hours FINDINGS: Endotracheal tube terminates 6.5 cm above the carina. The left jugular catheter has been replaced and its tip is more inferiorly located than the catheter on the prior study, now overlying the cavoatrial junction/high right atrium. Enteric tube terminates over the gastric body with side hole beyond the GE junction. Right pigtail pleural catheter remains in place. Partially loculated right-sided pleural effusion appears smaller than on the prior  study. A small loculated right pneumothorax is suspected laterally and in the base corresponding to the areas of decreased pleural fluid. Right mid and lower lung atelectasis persist. Medial left basilar aeration has improved. IMPRESSION: 1. Left jugular catheter terminates over the cavoatrial junction/high right atrium. 2. Decreased size of partially loculated right pleural effusion with pigtail pleural catheter remaining in place. Suspected small loculated right pneumothorax. 3. Persistent right lung atelectasis. 4. Improved left basilar aeration. Electronically Signed   By: Logan Bores M.D.   On: 11/12/2017 18:19     Medications:   . amiodarone 30 mg/hr (11/14/17 0549)  .  ceFAZolin (ANCEF) IV Stopped (11/14/17 3734)  . dextrose 50 mL/hr at 11/13/17 0600  . feeding supplement (VITAL 1.5 CAL) 1,000 mL (11/14/17 0342)  . fentaNYL infusion INTRAVENOUS 200 mcg/hr (11/14/17 0843)  . small volume/piggyback builder Stopped (11/14/17 1119)  . midazolam (VERSED) infusion 10 mg/hr (11/14/17 0843)  . norepinephrine (LEVOPHED) Adult infusion 5.973 mcg/min (11/13/17 0600)  . phenylephrine (NEO-SYNEPHRINE) Adult infusion Stopped (11/12/17 1617)  . pureflow 3 each (11/14/17 0340)  . vasopressin (PITRESSIN) infusion - *FOR SHOCK* Stopped (11/13/17 0500)   . alteplase (TPA) for intrapleural administration  10 mg Intrapleural Q12H   And  . pulmozyme (  DORNASE) for intrapleural administration  5 mg Intrapleural Q12H  . budesonide (PULMICORT) nebulizer solution  0.25 mg Nebulization BID  . chlorhexidine gluconate (MEDLINE KIT)  15 mL Mouth Rinse BID  . feeding supplement (PRO-STAT SUGAR FREE 64)  30 mL Per Tube TID  . folic acid  1 mg Per Tube Daily  . heparin injection (subcutaneous)  5,000 Units Subcutaneous Q8H  . insulin aspart  0-9 Units Subcutaneous Q4H  . ipratropium-albuterol  3 mL Nebulization Q4H  . mouth rinse  15 mL Mouth Rinse 10 times per day  . multivitamin  15 mL Per Tube Daily  .  pantoprazole sodium  40 mg Per Tube BID  . senna-docusate  2 tablet Per Tube BID  . thiamine  100 mg Per Tube Daily   acetaminophen, bisacodyl, fentaNYL, heparin, hydrALAZINE, ipratropium-albuterol, LORazepam, LORazepam, metoprolol tartrate, ondansetron (ZOFRAN) IV, sennosides, vecuronium  Assessment/ Plan:  62 y.o. male with a PMHx of osteoarthritis, hypertension, GERD who was admitted to [facility] on 11/02/2017 for evaluation of angioedema.  Patient was previously on lisinopril.   1.  Acute renal failure, secondary to hypotension. 2.  Acute respiratory failure. 3.  Angioedema. 4.  Hypotension. 5.  Right sided empyema s/p pleural drain.   Plan: Urine output was very low at 357 cc over the preceding 24 hours.  He also remains critically ill and remains on pressors.  Therefore at this time we will maintain the patient on CRRT.  Continue to monitor serum electrolytes closely.  Pleural drains remain in place.  Suspect that he will require additional time on the ventilator.  Overall prognosis guarded at the moment.    LOS: [place] 4/21/201911:59 AM

## 2017-11-14 NOTE — Progress Notes (Signed)
Patient ID: Dustin Orozco, male   DOB: 10/01/1955, 62 y.o.   MRN: [id number]  [facility] PROGRESS NOTE  Samrat Hayward HTM:[id number] DOB: [birth year] DOA: 11/21/2017 PCP: [facility] [place]  HPI/Subjective: Pt still on ventilator  Objective: Vitals:   11/14/17 1200 11/14/17 1300  BP: 117/65 (!) 86/54  Pulse: (!) 49 (!) 105  Resp: (!) 25 18  Temp: (!) 101.3 F (38.5 C)   SpO2: 93% 100%    Filed Weights   11/12/17 0500 11/13/17 0500 11/14/17 0305  Weight: 96.5 kg (212 lb 11.9 oz) 96.8 kg (213 lb 6.5 oz) 95.9 kg (211 lb 6.7 oz)    ROS: Review of Systems  Unable to perform ROS: Acuity of condition   Exam: Physical Exam  HENT:  Nose: No mucosal edema.  Unable to look into mouth  Eyes: Pupils are equal, round, and reactive to light. Conjunctivae are normal.  Neck: No JVD present. Carotid bruit is not present. No edema present. No thyroid mass and no thyromegaly present.  Cardiovascular: S1 normal and S2 normal. An irregularly irregular rhythm present. Exam reveals no gallop.  No murmur heard. Pulses:      Dorsalis pedis pulses are 2+ on the right side, and 2+ on the left side.  Respiratory: No respiratory distress. He has decreased breath sounds. He has no wheezes. He has no rhonchi. He has no rales.  GI: Soft. Bowel sounds are normal. There is no tenderness.  Musculoskeletal:       Right ankle: He exhibits swelling.       Left ankle: He exhibits swelling.  Lymphadenopathy:    He has no cervical adenopathy.  Neurological:  Intubated and sedated unable to get any response with sternal rub.  Skin: Skin is warm. No rash noted. Nails show no clubbing.  Psychiatric:  Intubated and sedated      Data Reviewed: Basic Metabolic Panel: Recent Labs  Lab 11/13/17 2136 11/14/17 0115 11/14/17 0537 11/14/17 0625 11/14/17 0950 11/14/17 1257  NA 135 136 132*  --  135 134*  K 3.6 3.5 3.6  --  3.9 4.1  CL 103 104 101  --  103 102  CO2 27 27 26   --  26 28  GLUCOSE 128*  127* 134*  --  125* 113*  BUN 59* 54* 49*  --  47* 43*  CREATININE 2.04* 1.93* 1.78*  --  1.84* 1.86*  CALCIUM 7.5* 7.4* 7.2*  --  7.6* 7.6*  MG 1.7 1.6*  --  2.1 1.9 1.8  PHOS 2.2* 2.0* 2.2*  --  2.5 2.5   Liver Function Tests: Recent Labs  Lab 11/08/17 0458  11/13/17 2136 11/14/17 0115 11/14/17 0537 11/14/17 0950 11/14/17 1257  AST 75*  --   --   --   --   --   --   ALT 75*  --   --   --   --   --   --   ALKPHOS 102  --   --   --   --   --   --   BILITOT 1.9*  --   --   --   --   --   --   PROT 5.5*  --   --   --   --   --   --   ALBUMIN 2.2*   < > 1.3* 1.3* 1.2* 1.3* 1.4*   < > = values in this interval not displayed.    No results for input(s): AMMONIA  in the last 168 hours. CBC: Recent Labs  Lab 11/08/17 0458 11/09/17 0733 11/10/17 0503 11/12/17 0556 11/13/17 0450 11/14/17 0537  WBC 17.6* 24.8* 30.7* 14.1* 15.5* 13.7*  NEUTROABS 15.6* 23.1* 28.5*  --   --   --   HGB 11.5* 11.9* 11.6* 10.2* 9.0* 7.8*  HCT 32.9* 35.1* 34.6* 30.3* 25.7* 23.3*  MCV 95.5 96.6 94.7 96.3 92.6 94.5  PLT 187 219 252 182 193 168   4CBG: Recent Labs  Lab 11/13/17 2026 11/14/17 0010 11/14/17 0354 11/14/17 0818 11/14/17 1255  GLUCAP 124* 103* 120* 99 127*    Recent Results (from the past 240 hour(s))  Culture, respiratory (NON-Expectorated)     Status: None   Collection Time: 11/08/17  5:14 AM  Result Value Ref Range Status   Specimen Description   Final    TRACHEAL ASPIRATE Performed at [facility], [address], [place], [place] [address]    Special Requests   Final    NONE Performed at [facility], [address] [place], [place] [address]    Gram Stain   Final    FEW WBC PRESENT, PREDOMINANTLY PMN ABUNDANT GRAM POSITIVE COCCI ABUNDANT GRAM NEGATIVE RODS Performed at [facility], [place] [address] [place], [place] [address]    Culture ABUNDANT STAPHYLOCOCCUS AUREUS  Final   Report Status 11/10/2017 FINAL  Final   Organism ID, Bacteria  STAPHYLOCOCCUS AUREUS  Final      Susceptibility   Staphylococcus aureus - MIC*    CIPROFLOXACIN <=0.5 SENSITIVE Sensitive     ERYTHROMYCIN <=0.25 SENSITIVE Sensitive     GENTAMICIN <=0.5 SENSITIVE Sensitive     OXACILLIN 0.5 SENSITIVE Sensitive     TETRACYCLINE <=1 SENSITIVE Sensitive     VANCOMYCIN 1 SENSITIVE Sensitive     TRIMETH/SULFA <=10 SENSITIVE Sensitive     CLINDAMYCIN <=0.25 SENSITIVE Sensitive     RIFAMPIN <=0.5 SENSITIVE Sensitive     Inducible Clindamycin NEGATIVE Sensitive     * ABUNDANT STAPHYLOCOCCUS AUREUS  Urine Culture     Status: Abnormal   Collection Time: 11/10/17  6:34 AM  Result Value Ref Range Status   Specimen Description   Final    URINE, RANDOM Performed at [facility], [address] [place], [place] [address]    Special Requests   Final    NONE Performed at [facility], [place], [place] [address]    Culture (A)  Final    20,000 COLONIES/mL STAPHYLOCOCCUS SPECIES (COAGULASE NEGATIVE)   Report Status 11/12/2017 FINAL  Final   Organism ID, Bacteria STAPHYLOCOCCUS SPECIES (COAGULASE NEGATIVE) (A)  Final      Susceptibility   Staphylococcus species (coagulase negative) - MIC*    CIPROFLOXACIN <=0.5 SENSITIVE Sensitive     GENTAMICIN <=0.5 SENSITIVE Sensitive     NITROFURANTOIN <=16 SENSITIVE Sensitive     OXACILLIN <=0.25 SENSITIVE Sensitive     TETRACYCLINE <=1 SENSITIVE Sensitive     VANCOMYCIN <=0.5 SENSITIVE Sensitive     TRIMETH/SULFA <=10 SENSITIVE Sensitive     CLINDAMYCIN 0.5 SENSITIVE Sensitive     RIFAMPIN <=0.5 SENSITIVE Sensitive     Inducible Clindamycin NEGATIVE Sensitive     * 20,000 COLONIES/mL STAPHYLOCOCCUS SPECIES (COAGULASE NEGATIVE)  CULTURE, BLOOD (ROUTINE X 2) w Reflex to ID Panel     Status: None (Preliminary result)   Collection Time: 11/10/17  8:15 AM  Result Value Ref Range Status   Specimen Description BLOOD BLOOD LEFT HAND  Final  Special Requests   Final    BOTTLES DRAWN  AEROBIC AND ANAEROBIC Blood Culture adequate volume   Culture   Final    NO GROWTH 4 DAYS Performed at [facility], [address] [place], [place] [address]    Report Status PENDING  Incomplete  CULTURE, BLOOD (ROUTINE X 2) w Reflex to ID Panel     Status: None (Preliminary result)   Collection Time: 11/10/17  8:25 AM  Result Value Ref Range Status   Specimen Description BLOOD BLOOD RIGHT HAND  Final   Special Requests   Final    BOTTLES DRAWN AEROBIC AND ANAEROBIC Blood Culture adequate volume   Culture   Final    NO GROWTH 4 DAYS Performed at [facility], [address] [place], [place] [address]    Report Status PENDING  Incomplete  Culture, respiratory (NON-Expectorated)     Status: None   Collection Time: 11/10/17 11:13 AM  Result Value Ref Range Status   Specimen Description   Final    TRACHEAL ASPIRATE Performed at [facility], [address], [place], [place] [address]    Special Requests   Final    NONE Performed at [facility], [address] [place], [place] [address]    Gram Stain   Final    RARE WBC PRESENT, PREDOMINANTLY PMN RARE GRAM NEGATIVE COCCOBACILLI Performed at [facility], [place] [address], [place], [place] [address]    Culture MODERATE STAPHYLOCOCCUS AUREUS  Final   Report Status 11/12/2017 FINAL  Final   Organism ID, Bacteria STAPHYLOCOCCUS AUREUS  Final      Susceptibility   Staphylococcus aureus - MIC*    CIPROFLOXACIN <=0.5 SENSITIVE Sensitive     ERYTHROMYCIN <=0.25 SENSITIVE Sensitive     GENTAMICIN <=0.5 SENSITIVE Sensitive     OXACILLIN 0.5 SENSITIVE Sensitive     TETRACYCLINE <=1 SENSITIVE Sensitive     VANCOMYCIN <=0.5 SENSITIVE Sensitive     TRIMETH/SULFA <=10 SENSITIVE Sensitive     CLINDAMYCIN <=0.25 SENSITIVE Sensitive     RIFAMPIN <=0.5 SENSITIVE Sensitive     Inducible Clindamycin NEGATIVE Sensitive     * MODERATE STAPHYLOCOCCUS AUREUS  Aerobic/Anaerobic Culture (surgical/deep  wound)     Status: None (Preliminary result)   Collection Time: 11/11/17  6:01 PM  Result Value Ref Range Status   Specimen Description   Final    WOUND Performed at [facility], [address], [place], [place] [address]    Special Requests   Final    Normal Performed at [facility], [address] [place], [place] [address]    Gram Stain   Final    ABUNDANT WBC PRESENT, PREDOMINANTLY PMN MODERATE GRAM POSITIVE COCCI RARE GRAM POSITIVE RODS    Culture   Final    FEW STAPHYLOCOCCUS AUREUS SUSCEPTIBILITIES TO FOLLOW FEW STREPTOCOCCUS GROUP C HOLDING FOR POSSIBLE ANAEROBE Performed at [facility], [place] [address] [place],  [address]    Report Status PENDING  Incomplete     Studies: Dg Chest Port 1 View  Result Date: 11/14/2017 CLINICAL DATA:  Acute respiratory failure. EXAM: PORTABLE CHEST 1 VIEW COMPARISON:  11/13/2017 FINDINGS: Endotracheal tube terminates 3 cm above the carina. Left jugular catheter terminates over the high right atrium. Enteric tube courses into the left upper abdomen with tip not imaged. Right pigtail pleural catheter remains in place. A large, partially loculated right pleural effusion has further increased in size with worsening right lung  aeration, particularly in the base. The left lung remains grossly clear. The left costophrenic angle was incompletely imaged. No pneumothorax is identified. IMPRESSION: Increasing size of large right pleural effusion with worsening right lung aeration. Electronically Signed   By: Logan Bores M.D.   On: 11/14/2017 07:44   Dg Chest Port 1 View  Result Date: 11/13/2017 CLINICAL DATA:  Acute respiratory failure. EXAM: PORTABLE CHEST 1 VIEW COMPARISON:  Yesterday. FINDINGS: The endotracheal tube remains in satisfactory position. Nasogastric tube extending into the stomach. Left jugular catheter tip in the right atrium. Normal sized heart. Stable right pigtail pleural catheter. Moderately large  right pleural effusion with increased fluid and resolution of previously demonstrated pleural air. Small amount of subcutaneous emphysema on the right. There is also ill-defined opacity in the right mid and lower lung zone without significant change. Clear left lung. Unremarkable bones. IMPRESSION: 1. Moderately large right pleural effusion with increased fluid and resolved pleural air. 2. Improving atelectasis in the right mid and lower lung zone. Electronically Signed   By: Claudie Revering M.D.   On: 11/13/2017 07:13   Dg Chest Port 1 View  Result Date: 11/12/2017 CLINICAL DATA:  Central line placement. EXAM: PORTABLE CHEST 1 VIEW COMPARISON:  11/12/2017 at 0435 hours FINDINGS: Endotracheal tube terminates 6.5 cm above the carina. The left jugular catheter has been replaced and its tip is more inferiorly located than the catheter on the prior study, now overlying the cavoatrial junction/high right atrium. Enteric tube terminates over the gastric body with side hole beyond the GE junction. Right pigtail pleural catheter remains in place. Partially loculated right-sided pleural effusion appears smaller than on the prior study. A small loculated right pneumothorax is suspected laterally and in the base corresponding to the areas of decreased pleural fluid. Right mid and lower lung atelectasis persist. Medial left basilar aeration has improved. IMPRESSION: 1. Left jugular catheter terminates over the cavoatrial junction/high right atrium. 2. Decreased size of partially loculated right pleural effusion with pigtail pleural catheter remaining in place. Suspected small loculated right pneumothorax. 3. Persistent right lung atelectasis. 4. Improved left basilar aeration. Electronically Signed   By: Logan Bores M.D.   On: 11/12/2017 18:19    Scheduled Meds: . alteplase (TPA) for intrapleural administration  10 mg Intrapleural Q12H   And  . pulmozyme (DORNASE) for intrapleural administration  5 mg Intrapleural Q12H   . budesonide (PULMICORT) nebulizer solution  0.25 mg Nebulization BID  . chlorhexidine gluconate (MEDLINE KIT)  15 mL Mouth Rinse BID  . feeding supplement (PRO-STAT SUGAR FREE 64)  30 mL Per Tube TID  . folic acid  1 mg Per Tube Daily  . heparin injection (subcutaneous)  5,000 Units Subcutaneous Q8H  . insulin aspart  0-9 Units Subcutaneous Q4H  . ipratropium-albuterol  3 mL Nebulization Q4H  . mouth rinse  15 mL Mouth Rinse 10 times per day  . multivitamin  15 mL Per Tube Daily  . pantoprazole sodium  40 mg Per Tube BID  . senna-docusate  2 tablet Per Tube BID  . thiamine  100 mg Per Tube Daily   Continuous Infusions: . amiodarone 30 mg/hr (11/14/17 0549)  .  ceFAZolin (ANCEF) IV 2 g (11/14/17 1308)  . dextrose 50 mL/hr at 11/13/17 0600  . feeding supplement (VITAL 1.5 CAL) 1,000 mL (11/14/17 0342)  . fentaNYL infusion INTRAVENOUS 200 mcg/hr (11/14/17 0843)  . small volume/piggyback builder Stopped (11/14/17 1119)  . midazolam (VERSED) infusion 10 mg/hr (11/14/17 1211)  . norepinephrine (  LEVOPHED) Adult infusion 5.973 mcg/min (11/13/17 0600)  . phenylephrine (NEO-SYNEPHRINE) Adult infusion Stopped (11/12/17 1617)  . propofol (DIPRIVAN) infusion 20 mcg/kg/min (11/14/17 1309)  . pureflow 3 each (11/14/17 0340)  . vasopressin (PITRESSIN) infusion - *FOR SHOCK* Stopped (11/13/17 0500)    Assessment/Plan:  1. Delirium tremens and alcohol withdrawal.  Continue current therapy 2. Septic shock with Mssa pneumonia on cefazolin.    Continue pressors and IV antibiotic 3. SVT, atrial fibrillation, nonsustained ventricular tachycardia.  Continue amiodarone drip. 4. Acute to subacute stroke with acute encephalopathy and not waking up once off sedation.  Appreciate neurology input prognosis poor 5. Acute hypoxic respiratory failure.  Continue vent support 6. Angioedema secondary to lisinopril.  This has improved with supportive care. 7. Acute kidney injury.  Nephrology following continue  CRRT  8. Hypernatremia on free water 9. NG tube feeding 10. Hypomagnesemia replaced  Code Status:     Code Status Orders  (From admission, onward)        Start     Ordered   11/10/2017 2322  Full code  Continuous     11/19/2017 2321    Code Status History    This patient has a current code status but no historical code status.     Family Communication: As per critical care specialist  disposition Plan: To be determined  Consultants:  Critical care team  Time spent: 25 minutes Case discussed with critical care specialist.  [facility]

## 2017-11-14 NOTE — Progress Notes (Signed)
Pharmacy Electrolyte  And CRRT Monitoring Consult:  Pharmacy consulted to assist in monitoring and replacing electrolytes in this 62 y.o. male admitted on 11/18/2017 with Allergic Reaction  Patient currently on CRRT. Patient with consistent CRRT interruptions throughout shift. Dialysis catheter has been replaced.   Labs:  Sodium (mmol/L)  Date Value  11/14/2017 135   Potassium (mmol/L)  Date Value  11/14/2017 3.9   Magnesium (mg/dL)  Date Value  16/10/960404/21/2019 1.9   Phosphorus (mg/dL)  Date Value  54/09/811904/21/2019 2.5   Calcium (mg/dL)  Date Value  14/78/295604/21/2019 7.6 (L)   Albumin (g/dL)  Date Value  21/30/865704/21/2019 1.3 (L)    Plan: 4/20: Electrolytes: K 3.3 - discussed with nephrologist who recommended KCl 10 mEq IV x 4 runs. Labs already ordered.   04/21 0200 PO4 2.0 and decreasing, Mg 1.6. Potassium phosphate 15 mmol x1 and magnesium sulfate 2 grams x1 ordered. Renal labs scheduled; check at next interval. Replacing cautiously due to renal dysfunction.  4/21 0950 K 3.9, Mag 1.9, Phos 2.5 - WNL now Renal labs scheduled; check at next interval.   Pharmacy should defer replacement until speaking with nephrology   CRRT med review: No further CRRT adjustments warranted. Keppra dose increased overnight due to pt parameters.   Pharmacy will continue to monitor and adjust per consult.   Marty HeckWang, Milen Lengacher L 11/14/2017 11:14 AM

## 2017-11-14 NOTE — Progress Notes (Signed)
Pharmacy Antibiotic Note  Dustin Orozco is a 62 y.o. male admitted on 11/15/2017 with angioedema. Patient currently requiring mechanical ventilation and being treated for MSSA pneumonia.  Pharmacy has been consulted for Cefazolin dosing. Patient initiated on CRRT on 4/18.   Plan: Continue cefazolin 2g IV Q8hr.   Height: 6\' 1"  (185.4 cm) Weight: 211 lb 6.7 oz (95.9 kg) IBW/kg (Calculated) : 79.9  Temp (24hrs), Avg:99.6 F (37.6 C), Min:98.5 F (36.9 C), Max:100.9 F (38.3 C)  Recent Labs  Lab 11/09/17 0733 11/10/17 0503  11/12/17 0556  11/13/17 0450  11/13/17 1737 11/13/17 2136 11/14/17 0115 11/14/17 0537 11/14/17 0950  WBC 24.8* 30.7*  --  14.1*  --  15.5*  --   --   --   --  13.7*  --   CREATININE 3.63* 3.36*   < > 2.48*   < > 2.47*   < > 2.15* 2.04* 1.93* 1.78* 1.84*   < > = values in this interval not displayed.    Estimated Creatinine Clearance: 50.2 mL/min (A) (by C-G formula based on SCr of 1.84 mg/dL (H)).    Allergies  Allergen Reactions  . Lisinopril     Angioedema    Antimicrobials this admission: Cefepime 4/14 >> 4/15 Unasyn 4/16  >> 4/17 Cefazolin 4/17 >>  Dose adjustments this admission: 4/15 Cefepime transitioned from 500mg  to 1000mg    Microbiology results: 4/17 Tracheal Aspirate: MSSA 4/17 Urine Cx: > 20K Staph Species  4/17 BCx x2 NGTD x4 days 4/15 Sputum: MSSA 4/7  MRSA PCR: negative   Thank you for allowing pharmacy to be a part of this patient's care.  Marty HeckWang, [name] 11/14/2017 11:11 AM

## 2017-11-14 NOTE — Progress Notes (Signed)
Subjective: Patient remains intubated and sedated.  Remains unresponsive.  On CRRT.  Noted to have seizure activity overnight with extremity stiffening.    Objective: Current vital signs: BP (!) 93/57 (BP Location: Right Arm)   Pulse 95   Temp 98.9 F (37.2 C) (Axillary)   Resp 15   Ht 6' 1"  (1.854 m)   Wt 95.9 kg (211 lb 6.7 oz)   SpO2 100%   BMI 27.89 kg/m  Vital signs in last 24 hours: Temp:  [98.5 F (36.9 C)-100.9 F (38.3 C)] 98.9 F (37.2 C) (04/21 0810) Pulse Rate:  [87-101] 95 (04/21 0810) Resp:  [12-23] 15 (04/21 0810) BP: (84-152)/(54-85) 93/57 (04/21 0800) SpO2:  [95 %-100 %] 100 % (04/21 0810) FiO2 (%):  [35 %-40 %] 35 % (04/21 0816) Weight:  [95.9 kg (211 lb 6.7 oz)] 95.9 kg (211 lb 6.7 oz) (04/21 0305)  Intake/Output from previous day: 04/20 0701 - 04/21 0700 In: 3162.8 [I.V.:1022.8; NG/GT:1440; IV Piggyback:700] Out: 567 [Urine:357; Chest Tube:210] Intake/Output this shift: Total I/O In: 444.4 [I.V.:79.4; NG/GT:110; IV Piggyback:255] Out: 535 [Urine:35; Chest Tube:500] Nutritional status: No diet orders on file  Neurologic Exam: Mental Status: Patient does not respond to verbal stimuli. Does not respond to deep sternal rub. Does not follow commands. No verbalizations noted.  Cranial Nerves: II: patient does not respond confrontation bilaterally, pupils right90m, left 327mand unreactivebilaterally III,IV,VI: doll's response present bilaterally.  V,VII: corneal reflexpresentbilaterally VIII: patient does not respond to verbal stimuli IX,X: gag reflexreduced, XI: trapezius strength unable to test bilaterally XII: tongue strength unable to test Motor: Extremities flaccid throughout. No spontaneous movement noted. No purposeful movements noted. Sensory: Does not respond to noxious stimuli in any extremity.   Lab Results: Basic Metabolic Panel: Recent Labs  Lab 11/13/17 1737 11/13/17 2136 11/14/17 0115 11/14/17 0537 11/14/17 0625  11/14/17 0950  NA 136 135 136 132*  --  135  K 3.7 3.6 3.5 3.6  --  3.9  CL 104 103 104 101  --  103  CO2 26 27 27 26   --  26  GLUCOSE 150* 128* 127* 134*  --  125*  BUN 65* 59* 54* 49*  --  47*  CREATININE 2.15* 2.04* 1.93* 1.78*  --  1.84*  CALCIUM 7.5* 7.5* 7.4* 7.2*  --  7.6*  MG 1.7 1.7 1.6*  --  2.1 1.9  PHOS 2.3* 2.2* 2.0* 2.2*  --  2.5    Liver Function Tests: Recent Labs  Lab 11/08/17 0458  11/13/17 1737 11/13/17 2136 11/14/17 0115 11/14/17 0537 11/14/17 0950  AST 75*  --   --   --   --   --   --   ALT 75*  --   --   --   --   --   --   ALKPHOS 102  --   --   --   --   --   --   BILITOT 1.9*  --   --   --   --   --   --   PROT 5.5*  --   --   --   --   --   --   ALBUMIN 2.2*   < > 1.3* 1.3* 1.3* 1.2* 1.3*   < > = values in this interval not displayed.   No results for input(s): LIPASE, AMYLASE in the last 168 hours. Recent Labs  Lab 11/07/17 1343  AMMONIA 32    CBC: Recent Labs  Lab 11/08/17 0458 11/09/17  5027 11/10/17 0503 11/12/17 0556 11/13/17 0450 11/14/17 0537  WBC 17.6* 24.8* 30.7* 14.1* 15.5* 13.7*  NEUTROABS 15.6* 23.1* 28.5*  --   --   --   HGB 11.5* 11.9* 11.6* 10.2* 9.0* 7.8*  HCT 32.9* 35.1* 34.6* 30.3* 25.7* 23.3*  MCV 95.5 96.6 94.7 96.3 92.6 94.5  PLT 187 219 252 182 193 168    Cardiac Enzymes: No results for input(s): CKTOTAL, CKMB, CKMBINDEX, TROPONINI in the last 168 hours.  Lipid Panel: Recent Labs  Lab 11/07/17 1818 11/10/17 1709 11/13/17 1737  TRIG 363* 299* 377*    CBG: Recent Labs  Lab 11/13/17 1725 11/13/17 2026 11/14/17 0010 11/14/17 0354 11/14/17 0818  GLUCAP 145* 124* 103* 120* 99    Microbiology: Results for orders placed or performed during the hospital encounter of 10/27/2017  MRSA PCR Screening     Status: None   Collection Time: 11/19/2017 11:38 PM  Result Value Ref Range Status   MRSA by PCR NEGATIVE NEGATIVE Final    Comment:        The GeneXpert MRSA Assay (FDA approved for NASAL  specimens only), is one component of a comprehensive MRSA colonization surveillance program. It is not intended to diagnose MRSA infection nor to guide or monitor treatment for MRSA infections. Performed at [facility], [address] [place], [place] [address]   Culture, respiratory (NON-Expectorated)     Status: None   Collection Time: 11/08/17  5:14 AM  Result Value Ref Range Status   Specimen Description   Final    TRACHEAL ASPIRATE Performed at [facility], [address], [place], [place] [address]    Special Requests   Final    NONE Performed at [facility], [address] [place], [place] [address]    Gram Stain   Final    FEW WBC PRESENT, PREDOMINANTLY PMN ABUNDANT GRAM POSITIVE COCCI ABUNDANT GRAM NEGATIVE RODS Performed at [facility], [place] [address] [place], [place] [address]    Culture ABUNDANT STAPHYLOCOCCUS AUREUS  Final   Report Status 11/10/2017 FINAL  Final   Organism ID, Bacteria STAPHYLOCOCCUS AUREUS  Final      Susceptibility   Staphylococcus aureus - MIC*    CIPROFLOXACIN <=0.5 SENSITIVE Sensitive     ERYTHROMYCIN <=0.25 SENSITIVE Sensitive     GENTAMICIN <=0.5 SENSITIVE Sensitive     OXACILLIN 0.5 SENSITIVE Sensitive     TETRACYCLINE <=1 SENSITIVE Sensitive     VANCOMYCIN 1 SENSITIVE Sensitive     TRIMETH/SULFA <=10 SENSITIVE Sensitive     CLINDAMYCIN <=0.25 SENSITIVE Sensitive     RIFAMPIN <=0.5 SENSITIVE Sensitive     Inducible Clindamycin NEGATIVE Sensitive     * ABUNDANT STAPHYLOCOCCUS AUREUS  Urine Culture     Status: Abnormal   Collection Time: 11/10/17  6:34 AM  Result Value Ref Range Status   Specimen Description   Final    URINE, RANDOM Performed at [facility], [address], [place], [place] [address]    Special Requests   Final    NONE Performed at [facility], [address] [place], [place] [address]    Culture (A)  Final    20,000 COLONIES/mL STAPHYLOCOCCUS  SPECIES (COAGULASE NEGATIVE)   Report Status 11/12/2017 FINAL  Final   Organism ID, Bacteria STAPHYLOCOCCUS SPECIES (COAGULASE NEGATIVE) (A)  Final      Susceptibility   Staphylococcus species (coagulase negative) - MIC*    CIPROFLOXACIN <=0.5 SENSITIVE Sensitive  GENTAMICIN <=0.5 SENSITIVE Sensitive     NITROFURANTOIN <=16 SENSITIVE Sensitive     OXACILLIN <=0.25 SENSITIVE Sensitive     TETRACYCLINE <=1 SENSITIVE Sensitive     VANCOMYCIN <=0.5 SENSITIVE Sensitive     TRIMETH/SULFA <=10 SENSITIVE Sensitive     CLINDAMYCIN 0.5 SENSITIVE Sensitive     RIFAMPIN <=0.5 SENSITIVE Sensitive     Inducible Clindamycin NEGATIVE Sensitive     * 20,000 COLONIES/mL STAPHYLOCOCCUS SPECIES (COAGULASE NEGATIVE)  CULTURE, BLOOD (ROUTINE X 2) w Reflex to ID Panel     Status: None (Preliminary result)   Collection Time: 11/10/17  8:15 AM  Result Value Ref Range Status   Specimen Description BLOOD BLOOD LEFT HAND  Final   Special Requests   Final    BOTTLES DRAWN AEROBIC AND ANAEROBIC Blood Culture adequate volume   Culture   Final    NO GROWTH 4 DAYS Performed at [facility], [address] [place], [place] [address]    Report Status PENDING  Incomplete  CULTURE, BLOOD (ROUTINE X 2) w Reflex to ID Panel     Status: None (Preliminary result)   Collection Time: 11/10/17  8:25 AM  Result Value Ref Range Status   Specimen Description BLOOD BLOOD RIGHT HAND  Final   Special Requests   Final    BOTTLES DRAWN AEROBIC AND ANAEROBIC Blood Culture adequate volume   Culture   Final    NO GROWTH 4 DAYS Performed at [facility], [address] [place], [place] [address]    Report Status PENDING  Incomplete  Culture, respiratory (NON-Expectorated)     Status: None   Collection Time: 11/10/17 11:13 AM  Result Value Ref Range Status   Specimen Description   Final    TRACHEAL ASPIRATE Performed at [facility], [address], [place], [place] [address]    Special  Requests   Final    NONE Performed at [facility], [address] [place], [place] [address]    Gram Stain   Final    RARE WBC PRESENT, PREDOMINANTLY PMN RARE GRAM NEGATIVE COCCOBACILLI Performed at [facility], [place] [address], [place], [place] [address]    Culture MODERATE STAPHYLOCOCCUS AUREUS  Final   Report Status 11/12/2017 FINAL  Final   Organism ID, Bacteria STAPHYLOCOCCUS AUREUS  Final      Susceptibility   Staphylococcus aureus - MIC*    CIPROFLOXACIN <=0.5 SENSITIVE Sensitive     ERYTHROMYCIN <=0.25 SENSITIVE Sensitive     GENTAMICIN <=0.5 SENSITIVE Sensitive     OXACILLIN 0.5 SENSITIVE Sensitive     TETRACYCLINE <=1 SENSITIVE Sensitive     VANCOMYCIN <=0.5 SENSITIVE Sensitive     TRIMETH/SULFA <=10 SENSITIVE Sensitive     CLINDAMYCIN <=0.25 SENSITIVE Sensitive     RIFAMPIN <=0.5 SENSITIVE Sensitive     Inducible Clindamycin NEGATIVE Sensitive     * MODERATE STAPHYLOCOCCUS AUREUS  Aerobic/Anaerobic Culture (surgical/deep wound)     Status: None (Preliminary result)   Collection Time: 11/11/17  6:01 PM  Result Value Ref Range Status   Specimen Description   Final    WOUND Performed at [facility], [address], [place], [place] [address]    Special Requests   Final    Normal Performed at [facility], [address] [place], [place] [address]    Gram Stain   Final    ABUNDANT WBC PRESENT, PREDOMINANTLY PMN MODERATE GRAM POSITIVE COCCI RARE GRAM POSITIVE RODS  Culture   Final    FEW STAPHYLOCOCCUS AUREUS CULTURE REINCUBATED FOR BETTER GROWTH FEW STREPTOCOCCUS GROUP C    Report Status PENDING  Incomplete    Coagulation Studies: No results for input(s): LABPROT, INR in the last 72 hours.  Imaging: Dg Chest Port 1 View  Result Date: 11/14/2017 CLINICAL DATA:  Acute respiratory failure. EXAM: PORTABLE CHEST 1 VIEW COMPARISON:  11/13/2017 FINDINGS: Endotracheal tube terminates 3 cm above the carina. Left jugular  catheter terminates over the high right atrium. Enteric tube courses into the left upper abdomen with tip not imaged. Right pigtail pleural catheter remains in place. A large, partially loculated right pleural effusion has further increased in size with worsening right lung aeration, particularly in the base. The left lung remains grossly clear. The left costophrenic angle was incompletely imaged. No pneumothorax is identified. IMPRESSION: Increasing size of large right pleural effusion with worsening right lung aeration. Electronically Signed   By: Logan Bores M.D.   On: 11/14/2017 07:44   Dg Chest Port 1 View  Result Date: 11/13/2017 CLINICAL DATA:  Acute respiratory failure. EXAM: PORTABLE CHEST 1 VIEW COMPARISON:  Yesterday. FINDINGS: The endotracheal tube remains in satisfactory position. Nasogastric tube extending into the stomach. Left jugular catheter tip in the right atrium. Normal sized heart. Stable right pigtail pleural catheter. Moderately large right pleural effusion with increased fluid and resolution of previously demonstrated pleural air. Small amount of subcutaneous emphysema on the right. There is also ill-defined opacity in the right mid and lower lung zone without significant change. Clear left lung. Unremarkable bones. IMPRESSION: 1. Moderately large right pleural effusion with increased fluid and resolved pleural air. 2. Improving atelectasis in the right mid and lower lung zone. Electronically Signed   By: Claudie Revering M.D.   On: 11/13/2017 07:13   Dg Chest Port 1 View  Result Date: 11/12/2017 CLINICAL DATA:  Central line placement. EXAM: PORTABLE CHEST 1 VIEW COMPARISON:  11/12/2017 at 0435 hours FINDINGS: Endotracheal tube terminates 6.5 cm above the carina. The left jugular catheter has been replaced and its tip is more inferiorly located than the catheter on the prior study, now overlying the cavoatrial junction/high right atrium. Enteric tube terminates over the gastric body  with side hole beyond the GE junction. Right pigtail pleural catheter remains in place. Partially loculated right-sided pleural effusion appears smaller than on the prior study. A small loculated right pneumothorax is suspected laterally and in the base corresponding to the areas of decreased pleural fluid. Right mid and lower lung atelectasis persist. Medial left basilar aeration has improved. IMPRESSION: 1. Left jugular catheter terminates over the cavoatrial junction/high right atrium. 2. Decreased size of partially loculated right pleural effusion with pigtail pleural catheter remaining in place. Suspected small loculated right pneumothorax. 3. Persistent right lung atelectasis. 4. Improved left basilar aeration. Electronically Signed   By: Logan Bores M.D.   On: 11/12/2017 18:19    Medications:  I have reviewed the patient's current medications. Scheduled: . alteplase (TPA) for intrapleural administration  10 mg Intrapleural Q12H   And  . pulmozyme (DORNASE) for intrapleural administration  5 mg Intrapleural Q12H  . budesonide (PULMICORT) nebulizer solution  0.25 mg Nebulization BID  . chlorhexidine gluconate (MEDLINE KIT)  15 mL Mouth Rinse BID  . feeding supplement (PRO-STAT SUGAR FREE 64)  30 mL Per Tube TID  . folic acid  1 mg Per Tube Daily  . heparin injection (subcutaneous)  5,000 Units Subcutaneous Q8H  . insulin aspart  0-9  Units Subcutaneous Q4H  . ipratropium-albuterol  3 mL Nebulization Q4H  . mouth rinse  15 mL Mouth Rinse 10 times per day  . multivitamin  15 mL Per Tube Daily  . pantoprazole sodium  40 mg Per Tube BID  . senna-docusate  2 tablet Per Tube BID  . thiamine  100 mg Per Tube Daily    Assessment/Plan: Patient without notable improvement.  Renal function improving.  Patient does remain on sedation.  Seizure activity noted.  Sedation increased.  Keppra increased as well.  Would continue treatment today.  Consider weaning of sedation in AM.  Will perform EEG at  that time as well.  If stable enough medically will consider MRI of the brain also.  Further discussions to be had with family at that time for further treatment.     Case discussed with Dr. Jefferson Fuel and family   LOS: 52 days   Alexis Goodell, MD Neurology [phone] 11/14/2017  11:25 AM

## 2017-11-14 NOTE — Progress Notes (Addendum)
PULMONARY / CRITICAL CARE MEDICINE   Name: Dustin Orozco MRN: [id number] DOB: 08-27-1955    ADMISSION DATE:  11/18/2017  PT PROFILE:   30 M on chronic ACE inhibitor therapy, nasally intubated intubated by ENT in ED for angioedema with severe macroglossia and lip swelling.  MAJOR EVENTS/TEST RESULTS: 04/08 developed anuric renal failure 04/08 Renal US: no hydro, bladder decompressed 04/09 Renal consultation: "Acute renal failure is likely secondary to severe ATN likely from hypotension.  Renal ultrasound is negative for obstruction" 04/10 Extubated. Tolerated well but requiring [place] O2 @ 6 LPM 04/11 tolerating extubation well.  However, increasing agitated delirium.  Wife reports heavy chronic alcohol use.  Lorazepam initiated.  Persistent agitation.  Dexmedetomidine infusion initiated. 04/12 BIPAP initiated las night 04/14 Intubated 4/15 remains intubated 4/17 remains delerious and intubated 4/18 +CVA +hydroPTX s/p chest tube, started CRRT 4/20: Patients status was tenuous yesterday. Required central line placement. Also dialysis catheter stopped working, was replaced. Had some twitching activity noted. Given Keppra, Ativan and started on a Versed infusion. 4/21. Over the last 24 hours patient has continued to have some jerking activity. Versed was increased and EEG is pending. Patient's Keppra was increased to 1000 known grams every 12. Still on CRRT  INDWELLING DEVICES:: ETT (L naris) 04/07 >> 04/10 Left VAsc cath 4/18 Chest tube 4/18  MICRO DATA: MRSA PCR 04/07 >> NEG   Anti-infectives (From admission, onward)   Start     Dose/Rate Route Frequency Ordered Stop   11/11/17 2200  ceFAZolin (ANCEF) IVPB 2g/100 mL premix     2 g 200 mL/hr over 30 Minutes Intravenous Every 8 hours 11/11/17 1725     11/10/17 1245  ceFAZolin (ANCEF) IVPB 1 g/50 mL premix  Status:  Discontinued     1 g 100 mL/hr over 30 Minutes Intravenous Every 12 hours 11/10/17 1237 11/11/17 1725   11/09/17 2200   Ampicillin-Sulbactam (UNASYN) 3 g in sodium chloride 0.9 % 100 mL IVPB  Status:  Discontinued     3 g 200 mL/hr over 30 Minutes Intravenous Every 12 hours 11/09/17 1649 11/10/17 1004   11/09/17 1115  Ampicillin-Sulbactam (UNASYN) 3 g in sodium chloride 0.9 % 100 mL IVPB  Status:  Discontinued     3 g 200 mL/hr over 30 Minutes Intravenous Every 12 hours 11/09/17 1112 11/09/17 1649   11/08/17 2200  ceFEPIme (MAXIPIME) 1 g in sodium chloride 0.9 % 100 mL IVPB  Status:  Discontinued     1 g 200 mL/hr over 30 Minutes Intravenous Every 24 hours 11/08/17 1551 11/09/17 1005   11/07/17 2200  ceFEPIme (MAXIPIME) 500 mg in dextrose 5 % 50 mL IVPB  Status:  Discontinued     500 mg 100 mL/hr over 30 Minutes Intravenous Every 24 hours 11/07/17 1808 11/08/17 1551      SUBJECTIVE:  Severe resp failure On vent  remains critically ill +STAPH pneumonia CT head +CVA CT chest +hydropneumothorax, lung abscess/empyema Chest tube in place On vasopressors and CRRT   VITAL SIGNS: BP 125/75   Pulse 93   Temp 100.3 F (37.9 C) (Axillary)   Resp 12   Ht 6\' 1"  (1.854 m)   Wt 211 lb 6.7 oz (95.9 kg)   SpO2 98%   BMI 27.89 kg/m    VENTILATOR SETTINGS: Vent Mode: PRVC FiO2 (%):  [35 %-40 %] 35 % Set Rate:  [24 bmp] 24 bmp Vt Set:  [500 mL] 500 mL PEEP:  [8 cmH20] 8 cmH20 Plateau Pressure:  [14 cmH20-18  cmH20] 16 cmH20 BIPAP for sleep  INTAKE / OUTPUT: I/O last 3 completed shifts: In: 5695.9 [I.V.:2859.2; NG/GT:2136.7; IV Piggyback:700] Out: 1122 [Urine:562; Chest Tube:560]  PHYSICAL EXAMINATION: General:  on vent GCS<8T Neuro: No focal neurological deficits, confused, agitated HEENT: NCAT, sclerae white Cardiovascular: RRR, no M Lungs: Rhonchi on right, clear on left +chest tube RT side Abdomen: Soft, NT, NABS Extremities: Warm, no edema  LABS:  BMET Recent Labs  Lab 11/13/17 2136 11/14/17 0115 11/14/17 0537  NA 135 136 132*  K 3.6 3.5 3.6  CL 103 104 101  CO2 27 27 26   BUN 59*  54* 49*  CREATININE 2.04* 1.93* 1.78*  GLUCOSE 128* 127* 134*    Electrolytes Recent Labs  Lab 11/13/17 1737 11/13/17 2136 11/14/17 0115 11/14/17 0537  CALCIUM 7.5* 7.5* 7.4* 7.2*  MG 1.7 1.7 1.6*  --   PHOS 2.3* 2.2* 2.0* 2.2*    CBC Recent Labs  Lab 11/12/17 0556 11/13/17 0450 11/14/17 0537  WBC 14.1* 15.5* 13.7*  HGB 10.2* 9.0* 7.8*  HCT 30.3* 25.7* 23.3*  PLT 182 193 168    Coag's No results for input(s): APTT, INR in the last 168 hours.  Sepsis Markers Recent Labs  Lab 11/10/17 0825  PROCALCITON 4.97    ABG Recent Labs  Lab 11/08/17 1908 11/12/17 0455 11/12/17 0845  PHART 7.40 7.12* 7.25*  PCO2ART 34 84* 58*  PO2ART 63* 99 101    Liver Enzymes Recent Labs  Lab 11/08/17 0458  11/13/17 2136 11/14/17 0115 11/14/17 0537  AST 75*  --   --   --   --   ALT 75*  --   --   --   --   ALKPHOS 102  --   --   --   --   BILITOT 1.9*  --   --   --   --   ALBUMIN 2.2*   < > 1.3* 1.3* 1.2*   < > = values in this interval not displayed.    Cardiac Enzymes No results for input(s): TROPONINI, PROBNP in the last 168 hours.  Glucose Recent Labs  Lab 11/13/17 0725 11/13/17 1258 11/13/17 1725 11/13/17 2026 11/14/17 0010 11/14/17 0354  GLUCAP 159* 132* 145* 124* 103* 120*    CXR: Mild vascular congestion, mild RLL atelectasis ASSESSMENT / PLAN: 62 yo AAM admitted for severe angioedema s/p extubation and re-intubated for severe DT's with encephalopathy with severe resp failure on vent complicated by STAPH pneumonia with persistent comatosed state complicated by acute CVA with with progressive renal failure with RT sided lung abscess/empyema with chest tube, severe hypoxia Chest x-ray shows reaccumulation of pleural effusion. Will instill Pulmozyme and tPA and chest tube dosing per mist trial  PULMONARY A: Acute hypoxic Respiratory failure secondary to airway compromise from DTs STAPH pneumonia lung abscess/empyema Angioedema-resolved P:    Continue supplemental oxygen as needed to maintain SPO2 >90% Continue nebulized bronchodilators as needed ABX as listed above Vent support   CARDIOVASCULAR A: septic shock Vasopressors as needed keep MAP>65 Iv ABX   RENAL A:   AKI,   P:   Monitor BMET intermittently Monitor I/Os Added free water, D5 started Electrolytes replacement vasc cath placed on CRRT  GASTROINTESTINAL A:   Dysphagia due to AMS P:   SUP: N/I postextubation NGT for enteral nutrition  INFECTIOUS STAPH PNEUMONIA leukocytosis CEFAZOLIN RT sided lung abscess/empyema  ENDOCRINE A:   Mild hyperglycemia without prior history of DM P:   CBGs q 8 hrs Consider SSI  for glucose >180  NEUROLOGIC A:   History of heavy alcohol use Alcohol withdrawals with Delirium Tremens Continue thiamine, Folic acid and MVI. Has failed multiple SAT/SBT's +CVA Follow up neuro recs  Anemia: No evidence of active bleeding will follow  Patient on CRRT to help evaluate potential for neurologic improvement. Appreciate neurology's assistance and family is agreeable  Critical Care Time devoted to patient care services described in this note is 4635   Dustin KindredJohn Boluwatife Flight, Dustin Orozco   Patient ID: Dustin Orozco, male   DOB: 1955-11-01, 62 y.o.   MRN: [id number] Patient ID: Dustin Orozco, male   DOB: 1955-11-01, 62 y.o.   MRN: [id number]

## 2017-11-14 NOTE — Progress Notes (Signed)
eLink Physician-Brief Progress Note Patient Name: Elenora Gammaustin Mccaskey DOB: 1956-06-25 MRN: [id number]   Date of Service  11/14/2017  HPI/Events of Note  Patient on mechanical ventilation and has R chest tube. Request for CXR this AM.  eICU Interventions  Will order portable CXR.     Intervention Category Minor Interventions: Clinical assessment - ordering diagnostic tests  Lenell AntuSommer,Neziah Braley Eugene 11/14/2017, 5:55 AM

## 2017-11-14 NOTE — Progress Notes (Signed)
Pharmacy Electrolyte  And CRRT Monitoring Consult:  Pharmacy consulted to assist in monitoring and replacing electrolytes in this 62 y.o. male admitted on 11/06/2017 with Allergic Reaction  Patient currently on CRRT. Patient with consistent CRRT interruptions throughout shift. Dialysis catheter has been replaced.   Labs:  Sodium (mmol/L)  Date Value  11/14/2017 136   Potassium (mmol/L)  Date Value  11/14/2017 3.5   Magnesium (mg/dL)  Date Value  40/98/119104/21/2019 1.6 (L)   Phosphorus (mg/dL)  Date Value  47/82/956204/21/2019 2.0 (L)   Calcium (mg/dL)  Date Value  13/08/657804/21/2019 7.4 (L)   Albumin (g/dL)  Date Value  46/96/295204/21/2019 1.3 (L)    Plan: Electrolytes: K 3.3 - discussed with nephrologist who recommended KCl 10 mEq IV x 4 runs. Labs already ordered. Pharmacy should defer replacement until speaking with nephrology.   CRRT med review: No further CRRT adjustments warranted.   Pharmacy will continue to monitor and adjust per consult.   04/21 0200 PO4 2.0 and decreasing, Mg 1.6. Potassium phosphate 15 mmol x1 and magnesium sulfate 2 grams x1 ordered. Renal labs scheduled; check at next interval. Replacing cautiously due to renal dysfunction.  Yousuf Ager S 11/14/2017 1:57 AM

## 2017-11-15 ENCOUNTER — Inpatient Hospital Stay: Payer: Medicare Other

## 2017-11-15 ENCOUNTER — Inpatient Hospital Stay (HOSPITAL_COMMUNITY): Payer: Medicare Other

## 2017-11-15 DIAGNOSIS — I639 Cerebral infarction, unspecified: Secondary | ICD-10-CM

## 2017-11-15 DIAGNOSIS — R6521 Severe sepsis with septic shock: Secondary | ICD-10-CM

## 2017-11-15 LAB — RENAL FUNCTION PANEL
ALBUMIN: 1.2 g/dL — AB (ref 3.5–5.0)
ANION GAP: 6 (ref 5–15)
ANION GAP: 8 (ref 5–15)
Albumin: 1.2 g/dL — ABNORMAL LOW (ref 3.5–5.0)
Albumin: 1.2 g/dL — ABNORMAL LOW (ref 3.5–5.0)
Albumin: 1.3 g/dL — ABNORMAL LOW (ref 3.5–5.0)
Anion gap: 6 (ref 5–15)
Anion gap: 7 (ref 5–15)
BUN: 35 mg/dL — ABNORMAL HIGH (ref 6–20)
BUN: 34 mg/dL — AB (ref 6–20)
BUN: 36 mg/dL — ABNORMAL HIGH (ref 6–20)
BUN: 37 mg/dL — ABNORMAL HIGH (ref 6–20)
CALCIUM: 7.7 mg/dL — AB (ref 8.9–10.3)
CHLORIDE: 102 mmol/L (ref 101–111)
CHLORIDE: 103 mmol/L (ref 101–111)
CHLORIDE: 101 mmol/L (ref 101–111)
CO2: 28 mmol/L (ref 22–32)
CO2: 27 mmol/L (ref 22–32)
CO2: 23 mmol/L (ref 22–32)
CO2: 24 mmol/L (ref 22–32)
CREATININE: 1.73 mg/dL — AB (ref 0.61–1.24)
CREATININE: 1.76 mg/dL — AB (ref 0.61–1.24)
Calcium: 7.6 mg/dL — ABNORMAL LOW (ref 8.9–10.3)
Calcium: 7.5 mg/dL — ABNORMAL LOW (ref 8.9–10.3)
Calcium: 7.3 mg/dL — ABNORMAL LOW (ref 8.9–10.3)
Chloride: 100 mmol/L — ABNORMAL LOW (ref 101–111)
Creatinine, Ser: 1.65 mg/dL — ABNORMAL HIGH (ref 0.61–1.24)
Creatinine, Ser: 1.9 mg/dL — ABNORMAL HIGH (ref 0.61–1.24)
GFR calc Af Amer: 49 mL/min — ABNORMAL LOW (ref >60)
GFR calc Af Amer: 47 mL/min — ABNORMAL LOW (ref >60)
GFR calc Af Amer: 42 mL/min — ABNORMAL LOW (ref >60)
GFR calc non Af Amer: 43 mL/min — ABNORMAL LOW (ref >60)
GFR calc non Af Amer: 40 mL/min — ABNORMAL LOW (ref >60)
GFR calc non Af Amer: 39 mL/min — ABNORMAL LOW (ref >60)
GFR calc non Af Amer: 36 mL/min — ABNORMAL LOW (ref >60)
GFR, EST AFRICAN AMERICAN: 46 mL/min — AB (ref >60)
GLUCOSE: 114 mg/dL — AB (ref 65–99)
GLUCOSE: 105 mg/dL — AB (ref 65–99)
GLUCOSE: 117 mg/dL — AB (ref 65–99)
GLUCOSE: 92 mg/dL (ref 65–99)
PHOSPHORUS: 3.9 mg/dL (ref 2.5–4.6)
PHOSPHORUS: 3.8 mg/dL (ref 2.5–4.6)
POTASSIUM: 4.7 mmol/L (ref 3.5–5.1)
POTASSIUM: 5 mmol/L (ref 3.5–5.1)
Phosphorus: 2.8 mg/dL (ref 2.5–4.6)
Phosphorus: 3.4 mg/dL (ref 2.5–4.6)
Potassium: 4.5 mmol/L (ref 3.5–5.1)
Potassium: 4.8 mmol/L (ref 3.5–5.1)
SODIUM: 134 mmol/L — AB (ref 135–145)
Sodium: 135 mmol/L (ref 135–145)
Sodium: 133 mmol/L — ABNORMAL LOW (ref 135–145)
Sodium: 133 mmol/L — ABNORMAL LOW (ref 135–145)

## 2017-11-15 LAB — CBC
HEMATOCRIT: 23.5 % — AB (ref 40.0–52.0)
HEMOGLOBIN: 7.9 g/dL — AB (ref 13.0–18.0)
MCH: 32.7 pg (ref 26.0–34.0)
MCHC: 33.7 g/dL (ref 32.0–36.0)
MCV: 97 fL (ref 80.0–100.0)
Platelets: 174 10*3/uL (ref 150–440)
RBC: 2.42 MIL/uL — ABNORMAL LOW (ref 4.40–5.90)
RDW: 15.2 % — ABNORMAL HIGH (ref 11.5–14.5)
WBC: 9.8 10*3/uL (ref 3.8–10.6)

## 2017-11-15 LAB — GLUCOSE, CAPILLARY
GLUCOSE-CAPILLARY: 115 mg/dL — AB (ref 65–99)
GLUCOSE-CAPILLARY: 107 mg/dL — AB (ref 65–99)
GLUCOSE-CAPILLARY: 62 mg/dL — AB (ref 65–99)
GLUCOSE-CAPILLARY: 99 mg/dL (ref 65–99)
GLUCOSE-CAPILLARY: 94 mg/dL (ref 65–99)
GLUCOSE-CAPILLARY: 103 mg/dL — AB (ref 65–99)
Glucose-Capillary: 107 mg/dL — ABNORMAL HIGH (ref 65–99)

## 2017-11-15 LAB — BASIC METABOLIC PANEL
ANION GAP: 7 (ref 5–15)
BUN: 34 mg/dL — ABNORMAL HIGH (ref 6–20)
CALCIUM: 7.5 mg/dL — AB (ref 8.9–10.3)
CO2: 26 mmol/L (ref 22–32)
Chloride: 101 mmol/L (ref 101–111)
Creatinine, Ser: 1.67 mg/dL — ABNORMAL HIGH (ref 0.61–1.24)
GFR, EST AFRICAN AMERICAN: 49 mL/min — AB (ref >60)
GFR, EST NON AFRICAN AMERICAN: 42 mL/min — AB (ref >60)
Glucose, Bld: 102 mg/dL — ABNORMAL HIGH (ref 65–99)
Potassium: 4.9 mmol/L (ref 3.5–5.1)
Sodium: 134 mmol/L — ABNORMAL LOW (ref 135–145)

## 2017-11-15 LAB — MAGNESIUM
MAGNESIUM: 2.1 mg/dL (ref 1.7–2.4)
MAGNESIUM: 1.9 mg/dL (ref 1.7–2.4)
Magnesium: 2.1 mg/dL (ref 1.7–2.4)

## 2017-11-15 LAB — CULTURE, BLOOD (ROUTINE X 2)
Culture: NO GROWTH
Culture: NO GROWTH
Special Requests: ADEQUATE
Special Requests: ADEQUATE

## 2017-11-15 MED ORDER — VITAL 1.5 CAL PO LIQD
1000.0000 mL | ORAL | Status: DC
  Administered 2017-11-15: 1000 mL

## 2017-11-15 MED ORDER — LACTULOSE 10 GM/15ML PO SOLN
30.0000 g | Freq: Two times a day (BID) | ORAL | Status: DC
  Administered 2017-11-15: 30 g
  Filled 2017-11-15: qty 60

## 2017-11-15 MED ORDER — CEFAZOLIN SODIUM-DEXTROSE 1-4 GM/50ML-% IV SOLN
1.0000 g | Freq: Three times a day (TID) | INTRAVENOUS | Status: DC
  Administered 2017-11-15 – 2017-11-16 (×3): 1 g via INTRAVENOUS
  Filled 2017-11-15 (×6): qty 50

## 2017-11-15 MED ORDER — RIFAXIMIN 550 MG PO TABS
550.0000 mg | ORAL_TABLET | Freq: Two times a day (BID) | ORAL | Status: DC
  Administered 2017-11-15 – 2017-11-16 (×3): 550 mg
  Filled 2017-11-15 (×3): qty 1

## 2017-11-15 MED ORDER — PANTOPRAZOLE SODIUM 40 MG PO PACK
40.0000 mg | PACK | Freq: Every day | ORAL | Status: DC
  Administered 2017-11-15 – 2017-11-16 (×2): 40 mg
  Filled 2017-11-15 (×2): qty 20

## 2017-11-15 NOTE — Progress Notes (Signed)
[facility]  ROUNDING NOTE   Subjective:   CRRT no UF.   Tmax 101.3  Off vasopressin and norepinephrine  Off D5W  Continues on phenylephrine.    Objective:  Vital signs in last 24 hours:  Temp:  [95.9 F (35.5 C)-101.3 F (38.5 C)] 98.6 F (37 C) (04/22 0900) Pulse Rate:  [46-114] 84 (04/22 0900) Resp:  [15-29] 17 (04/22 0900) BP: (72-117)/(45-87) 107/87 (04/22 0900) SpO2:  [93 %-100 %] 100 % (04/22 0900) FiO2 (%):  [21 %-35 %] 21 % (04/22 0811) Weight:  [100.3 kg (221 lb 1.9 oz)] 100.3 kg (221 lb 1.9 oz) (04/22 0500)  Weight change: 4.4 kg (9 lb 11.2 oz) Filed Weights   11/13/17 0500 11/14/17 0305 11/15/17 0500  Weight: 96.8 kg (213 lb 6.5 oz) 95.9 kg (211 lb 6.7 oz) 100.3 kg (221 lb 1.9 oz)    Intake/Output: I/O last 3 completed shifts: In: 9203.7 [I.V.:7423.6; NG/GT:1225.1; IV Piggyback:555] Out: 2162 [Urine:342; Chest Tube:1820]   Intake/Output this shift:  Total I/O In: 72.9 [I.V.:72.9] Out: 600 [Chest Tube:600]  Physical Exam: General: Critically ill   Head: ETT, NGT to suction  Eyes: Pupils no reactive to light  Neck: trachea midline  Lungs:  Scattered rhonchi, PRVC 21%, Right chest tube  Heart: regular  Abdomen:  Soft, nontender, bowel sounds present  Extremities: 1+ peripheral edema.  Neurologic: Intubated, sedated, +myoclonic movements  Skin: No lesions  GU:  Foley with urine  Access:  Left IJ temp catheter 4/18 Dr. Mortimer Fries    Basic Metabolic Panel: Recent Labs  Lab 11/14/17 1257 11/14/17 1713 11/14/17 2105 11/15/17 0149 11/15/17 0533  NA 134* 135 135 134* 135  K 4.1 4.1 4.3 4.5 4.7  CL 102 102 101 100* 102  CO2 28 28 28 28 27   GLUCOSE 113* 115* 111* 114* 105*  BUN 43* 41* 38* 35* 34*  CREATININE 1.86* 1.77* 1.75* 1.65* 1.73*  CALCIUM 7.6* 7.6* 7.6* 7.7* 7.6*  MG 1.8 1.7 1.7 2.1 2.1  PHOS 2.5 2.7 3.7 3.9 3.8    Liver Function Tests: Recent Labs  Lab 11/14/17 1257 11/14/17 1713 11/14/17 2105 11/15/17 0149  11/15/17 0533  ALBUMIN 1.4* 1.3* 1.3* 1.2* 1.2*   No results for input(s): LIPASE, AMYLASE in the last 168 hours. No results for input(s): AMMONIA in the last 168 hours.  CBC: Recent Labs  Lab 11/09/17 0733 11/10/17 0503 11/12/17 0556 11/13/17 0450 11/14/17 0537 11/15/17 0533  WBC 24.8* 30.7* 14.1* 15.5* 13.7* 9.8  NEUTROABS 23.1* 28.5*  --   --   --   --   HGB 11.9* 11.6* 10.2* 9.0* 7.8* 7.9*  HCT 35.1* 34.6* 30.3* 25.7* 23.3* 23.5*  MCV 96.6 94.7 96.3 92.6 94.5 97.0  PLT 219 252 182 193 168 174    Cardiac Enzymes: No results for input(s): CKTOTAL, CKMB, CKMBINDEX, TROPONINI in the last 168 hours.  BNP: Invalid input(s): POCBNP  CBG: Recent Labs  Lab 11/14/17 2126 11/15/17 0158 11/15/17 0428 11/15/17 0752 11/15/17 0820  GLUCAP 103* 115* 107* 62* 99    Microbiology: Results for orders placed or performed during the hospital encounter of 10/30/2017  MRSA PCR Screening     Status: None   Collection Time: 10/25/2017 11:38 PM  Result Value Ref Range Status   MRSA by PCR NEGATIVE NEGATIVE Final    Comment:        The GeneXpert MRSA Assay (FDA approved for NASAL specimens only), is one component of a comprehensive MRSA colonization surveillance program. It  is not intended to diagnose MRSA infection nor to guide or monitor treatment for MRSA infections. Performed at [facility], [address] [place], [place] [address]   Culture, respiratory (NON-Expectorated)     Status: None   Collection Time: 11/08/17  5:14 AM  Result Value Ref Range Status   Specimen Description   Final    TRACHEAL ASPIRATE Performed at [facility], [address] [place], [place] [address]    Special Requests   Final    NONE Performed at [facility], [address] [place], [place] [address]    Gram Stain   Final    FEW WBC PRESENT, PREDOMINANTLY PMN ABUNDANT GRAM POSITIVE COCCI ABUNDANT GRAM NEGATIVE RODS Performed at [facility],  [place] [address], [place], [place] [address]    Culture ABUNDANT STAPHYLOCOCCUS AUREUS  Final   Report Status 11/10/2017 FINAL  Final   Organism ID, Bacteria STAPHYLOCOCCUS AUREUS  Final      Susceptibility   Staphylococcus aureus - MIC*    CIPROFLOXACIN <=0.5 SENSITIVE Sensitive     ERYTHROMYCIN <=0.25 SENSITIVE Sensitive     GENTAMICIN <=0.5 SENSITIVE Sensitive     OXACILLIN 0.5 SENSITIVE Sensitive     TETRACYCLINE <=1 SENSITIVE Sensitive     VANCOMYCIN 1 SENSITIVE Sensitive     TRIMETH/SULFA <=10 SENSITIVE Sensitive     CLINDAMYCIN <=0.25 SENSITIVE Sensitive     RIFAMPIN <=0.5 SENSITIVE Sensitive     Inducible Clindamycin NEGATIVE Sensitive     * ABUNDANT STAPHYLOCOCCUS AUREUS  Urine Culture     Status: Abnormal   Collection Time: 11/10/17  6:34 AM  Result Value Ref Range Status   Specimen Description   Final    URINE, RANDOM Performed at [facility], [address] [place], [place] [address]    Special Requests   Final    NONE Performed at [facility], [address], [place], [place] [address]    Culture (A)  Final    20,000 COLONIES/mL STAPHYLOCOCCUS SPECIES (COAGULASE NEGATIVE)   Report Status 11/12/2017 FINAL  Final   Organism ID, Bacteria STAPHYLOCOCCUS SPECIES (COAGULASE NEGATIVE) (A)  Final      Susceptibility   Staphylococcus species (coagulase negative) - MIC*    CIPROFLOXACIN <=0.5 SENSITIVE Sensitive     GENTAMICIN <=0.5 SENSITIVE Sensitive     NITROFURANTOIN <=16 SENSITIVE Sensitive     OXACILLIN <=0.25 SENSITIVE Sensitive     TETRACYCLINE <=1 SENSITIVE Sensitive     VANCOMYCIN <=0.5 SENSITIVE Sensitive     TRIMETH/SULFA <=10 SENSITIVE Sensitive     CLINDAMYCIN 0.5 SENSITIVE Sensitive     RIFAMPIN <=0.5 SENSITIVE Sensitive     Inducible Clindamycin NEGATIVE Sensitive     * 20,000 COLONIES/mL STAPHYLOCOCCUS SPECIES (COAGULASE NEGATIVE)  CULTURE, BLOOD (ROUTINE X 2) w Reflex to ID Panel     Status: None   Collection Time: 11/10/17  8:15 AM   Result Value Ref Range Status   Specimen Description BLOOD BLOOD LEFT HAND  Final   Special Requests   Final    BOTTLES DRAWN AEROBIC AND ANAEROBIC Blood Culture adequate volume   Culture   Final    NO GROWTH 5 DAYS Performed at [facility], [address] [place], [place] [address]    Report Status 11/15/2017 FINAL  Final  CULTURE, BLOOD (ROUTINE X 2) w Reflex to ID Panel     Status: None   Collection Time: 11/10/17  8:25 AM  Result Value Ref Range  Status   Specimen Description BLOOD BLOOD RIGHT HAND  Final   Special Requests   Final    BOTTLES DRAWN AEROBIC AND ANAEROBIC Blood Culture adequate volume   Culture   Final    NO GROWTH 5 DAYS Performed at [facility], [address], [place], [place] [address]    Report Status 11/15/2017 FINAL  Final  Culture, respiratory (NON-Expectorated)     Status: None   Collection Time: 11/10/17 11:13 AM  Result Value Ref Range Status   Specimen Description   Final    TRACHEAL ASPIRATE Performed at [facility], [address], [place], [place] [address]    Special Requests   Final    NONE Performed at [facility], [address] [place], [place] [address]    Gram Stain   Final    RARE WBC PRESENT, PREDOMINANTLY PMN RARE GRAM NEGATIVE COCCOBACILLI Performed at [facility], [place] [address] [place], [place] [address]    Culture MODERATE STAPHYLOCOCCUS AUREUS  Final   Report Status 11/12/2017 FINAL  Final   Organism ID, Bacteria STAPHYLOCOCCUS AUREUS  Final      Susceptibility   Staphylococcus aureus - MIC*    CIPROFLOXACIN <=0.5 SENSITIVE Sensitive     ERYTHROMYCIN <=0.25 SENSITIVE Sensitive     GENTAMICIN <=0.5 SENSITIVE Sensitive     OXACILLIN 0.5 SENSITIVE Sensitive     TETRACYCLINE <=1 SENSITIVE Sensitive     VANCOMYCIN <=0.5 SENSITIVE Sensitive     TRIMETH/SULFA <=10 SENSITIVE Sensitive     CLINDAMYCIN <=0.25 SENSITIVE Sensitive     RIFAMPIN <=0.5 SENSITIVE Sensitive      Inducible Clindamycin NEGATIVE Sensitive     * MODERATE STAPHYLOCOCCUS AUREUS  Aerobic/Anaerobic Culture (surgical/deep wound)     Status: None (Preliminary result)   Collection Time: 11/11/17  6:01 PM  Result Value Ref Range Status   Specimen Description   Final    WOUND Performed at [facility], [address], [place], [place] [address]    Special Requests   Final    Normal Performed at [facility], [address] [place], [place] [address]    Gram Stain   Final    ABUNDANT WBC PRESENT, PREDOMINANTLY PMN MODERATE GRAM POSITIVE COCCI RARE GRAM POSITIVE RODS    Culture   Final    FEW STAPHYLOCOCCUS AUREUS SUSCEPTIBILITIES TO FOLLOW FEW STREPTOCOCCUS GROUP C HOLDING FOR POSSIBLE ANAEROBE Performed at [facility], [place] [address], [place], [place] [address]    Report Status PENDING  Incomplete    Coagulation Studies: No results for input(s): LABPROT, INR in the last 72 hours.  Urinalysis: No results for input(s): COLORURINE, LABSPEC, PHURINE, GLUCOSEU, HGBUR, BILIRUBINUR, KETONESUR, PROTEINUR, UROBILINOGEN, NITRITE, LEUKOCYTESUR in the last 72 hours.  Invalid input(s): APPERANCEUR    Imaging: Dg Chest Port 1 View  Result Date: 11/15/2017 CLINICAL DATA:  Patient admitted 11/21/2017 for pneumonia and alcohol withdrawal. EXAM: PORTABLE CHEST 1 VIEW COMPARISON:  Single-view of the chest 11/14/2017, 11/13/2017 and 11/12/2017. CT chest 11/11/2017. FINDINGS: Support tubes and lines including a pigtail catheter in the right chest are unchanged. Right pleural effusion has decreased since yesterday's study. Extensive airspace disease in the right chest persists. The left lung appears clear. No pneumothorax. Heart size is normal. There is some subcutaneous emphysema over the right chest. IMPRESSION: Support tubes and lines are unchanged.  Negative for pneumothorax. Right pleural effusion is decreased since yesterday's examination. No change in extensive right  side airspace disease.  Electronically Signed   By: Inge Rise M.D.   On: 11/15/2017 07:45   Dg Chest Port 1 View  Result Date: 11/14/2017 CLINICAL DATA:  Acute respiratory failure. EXAM: PORTABLE CHEST 1 VIEW COMPARISON:  11/13/2017 FINDINGS: Endotracheal tube terminates 3 cm above the carina. Left jugular catheter terminates over the high right atrium. Enteric tube courses into the left upper abdomen with tip not imaged. Right pigtail pleural catheter remains in place. A large, partially loculated right pleural effusion has further increased in size with worsening right lung aeration, particularly in the base. The left lung remains grossly clear. The left costophrenic angle was incompletely imaged. No pneumothorax is identified. IMPRESSION: Increasing size of large right pleural effusion with worsening right lung aeration. Electronically Signed   By: Logan Bores M.D.   On: 11/14/2017 07:44     Medications:   .  ceFAZolin (ANCEF) IV Stopped (11/15/17 1694)  . dextrose 50 mL/hr at 11/15/17 0500  . feeding supplement (VITAL 1.5 CAL) Stopped (11/14/17 1500)  . small volume/piggyback builder Stopped (11/14/17 2222)  . phenylephrine (NEO-SYNEPHRINE) Adult infusion 150 mcg/min (11/15/17 0945)  . propofol (DIPRIVAN) infusion Stopped (11/15/17 0830)  . pureflow 2,500 mL/hr at 11/15/17 0256   . alteplase (TPA) for intrapleural administration  10 mg Intrapleural Q12H   And  . pulmozyme (DORNASE) for intrapleural administration  5 mg Intrapleural Q12H  . chlorhexidine gluconate (MEDLINE KIT)  15 mL Mouth Rinse BID  . feeding supplement (PRO-STAT SUGAR FREE 64)  30 mL Per Tube TID  . folic acid  1 mg Per Tube Daily  . heparin injection (subcutaneous)  5,000 Units Subcutaneous Q8H  . insulin aspart  0-9 Units Subcutaneous Q4H  . lactulose  30 g Per Tube BID  . mouth rinse  15 mL Mouth Rinse 10 times per day  . multivitamin  15 mL Per Tube Daily  . pantoprazole sodium  40 mg Per Tube Daily  .  rifaximin  550 mg Per Tube BID  . senna-docusate  2 tablet Per Tube BID  . thiamine  100 mg Per Tube Daily   acetaminophen, bisacodyl, fentaNYL, heparin, hydrALAZINE, ipratropium-albuterol, LORazepam, metoprolol tartrate, ondansetron (ZOFRAN) IV, sennosides, vecuronium  Assessment/ Plan:  62 y.o.black male with a PMHx of osteoarthritis, hypertension, GERD who was admitted to [facility] on 11/04/2017 for angioedema.  Patient was previously on lisinopril.   1.  Acute renal failure, secondary to hypotension. Creatinine on admission of 0.99, normal GFR.  Oliguric. Requiring hemodialysis. CRRT  2.  Acute respiratory failure: requiring mechanical ventilation. With MSSA pneumonia and hydropneumothorax/empyema status post chest tube on 4/18  3.  Angioedema: secondary to ACE-I. Added to allergy list. Not currently on systemic steroids.  4.  Hypotension: requring vasopressors. Phenylephrine. Concern for underlying infection or neurologic condition.   5. Altered Mental Status: status post CVA. With myclonic movements.  - MRI and EEG for later today.   Overall prognosis critical. Discussed case with neurology, Dr. Doy Mince, and pulmonary/critical care, Dr. Alva Garnet    LOS: 15 Samel Bruna 4/22/201910:03 AM

## 2017-11-15 NOTE — Progress Notes (Signed)
Chest tube tubing unclamped.  Immediate flow produced 150 mls serosanguinous yellowish output.  Will continue to monitor.

## 2017-11-15 NOTE — Progress Notes (Signed)
Pharmacy Antibiotic Note/CRRT Medication adjustment  Dustin Orozco is a 62 y.o. male admitted on 10/30/2017 with angioedema. Patient currently requiring mechanical ventilation and being treated for MSSA pneumonia.  Pharmacy has been consulted for Cefazolin dosing. Patient initiated on CRRT on 4/18.   Plan: 1. Will change cefazolin dosing to 1 g iv q 8 hours for CRRT.   2. Keppra dosing was increased to 1000 mg bid as patient seized on 750 mg bid while receiving CRRT. No medication adjustments are necessary for CRRT presently.   Height: 6\' 1"  (185.4 cm) Weight: 221 lb 1.9 oz (100.3 kg) IBW/kg (Calculated) : 79.9  Temp (24hrs), Avg:98.1 F (36.7 C), Min:95.9 F (35.5 C), Max:101.3 F (38.5 C)  Recent Labs  Lab 11/10/17 0503  11/12/17 0556  11/13/17 0450  11/14/17 0537  11/14/17 1257 11/14/17 1713 11/14/17 2105 11/15/17 0149 11/15/17 0533  WBC 30.7*  --  14.1*  --  15.5*  --  13.7*  --   --   --   --   --  9.8  CREATININE 3.36*   < > 2.48*   < > 2.47*   < > 1.78*   < > 1.86* 1.77* 1.75* 1.65* 1.73*   < > = values in this interval not displayed.    Estimated Creatinine Clearance: 54.5 mL/min (A) (by C-G formula based on SCr of 1.73 mg/dL (H)).    Allergies  Allergen Reactions  . Lisinopril     Angioedema    Antimicrobials this admission: Cefepime 4/14 >> 4/15 Unasyn 4/16  >> 4/17 Cefazolin 4/17 >>   Microbiology results: 4/17 Tracheal Aspirate: MSSA 4/17 Urine Cx: > 20K Staph Species  4/17 BCx x2 NGTD x4 days 4/15 Sputum: MSSA 4/7  MRSA PCR: negative   Thank you for allowing pharmacy to be a part of this patient's care.  Luisa HartChristy, Happy Begeman D 11/15/2017 11:45 AM

## 2017-11-15 NOTE — Progress Notes (Signed)
Minimal output from chest tube--25 mls. Cathflo instilled by NP, M. Tukov.  Will clamp x 1 hour.

## 2017-11-15 NOTE — Progress Notes (Signed)
Pt off sedation since 08:30 this am.  Patient not waking up or following commands.  Pt tolerating CRRT- had 200 ml urine output.  Tube feeds restarted.  Pt's MRI shows acute stroke and scattered infarcts on both sides of the brain. Dr. Sung AmabileSimonds was notified of MRI results.

## 2017-11-15 NOTE — Progress Notes (Signed)
Patient ID: Dustin Orozco, male   DOB: Apr 30, 1956, 62 y.o.   MRN: [id number]  [facility] PROGRESS NOTE  D[place] Orozco UGQ:[id number] DOB: [birth year] DOA: 11/06/2017 PCP: [facility]  HPI/Subjective: He continues to have fever  Objective: Vitals:   11/15/17 1100 11/15/17 1200  BP: (!) 135/116 113/81  Pulse: (!) 102 (!) 105  Resp: (!) 23 20  Temp: (!) 100.4 F (38 C)   SpO2: 100% 100%    Filed Weights   11/13/17 0500 11/14/17 0305 11/15/17 0500  Weight: 96.8 kg (213 lb 6.5 oz) 95.9 kg (211 lb 6.7 oz) 100.3 kg (221 lb 1.9 oz)    ROS: Review of Systems  Unable to perform ROS: Acuity of condition   Exam: Physical Exam  HENT:  Nose: No mucosal edema.  Unable to look into mouth  Eyes: Pupils are equal, round, and reactive to light. Conjunctivae are normal.  Neck: No JVD present. Carotid bruit is not present. No edema present. No thyroid mass and no thyromegaly present.  Cardiovascular: S1 normal and S2 normal. An irregularly irregular rhythm present. Exam reveals no gallop.  No murmur heard. Pulses:      Dorsalis pedis pulses are 2+ on the right side, and 2+ on the left side.  Respiratory: No respiratory distress. He has decreased breath sounds. He has no wheezes. He has no rhonchi. He has no rales.  GI: Soft. Bowel sounds are normal. There is no tenderness.  Musculoskeletal:       Right ankle: He exhibits swelling.       Left ankle: He exhibits swelling.  Lymphadenopathy:    He has no cervical adenopathy.  Neurological:  Intubated and sedated unable to get any response with sternal rub.  Skin: Skin is warm. No rash noted. Nails show no clubbing.  Psychiatric:  Intubated and sedated      Data Reviewed: Basic Metabolic Panel: Recent Labs  Lab 11/14/17 1257 11/14/17 1713 11/14/17 2105 11/15/17 0149 11/15/17 0533 11/15/17 1302  NA 134* 135 135 134* 134*  135 133*  K 4.1 4.1 4.3 4.5 4.9  4.7 4.8  CL 102 102 101 100* 101  102 103  CO2 28 28 28  28 26  27 23   GLUCOSE 113* 115* 111* 114* 102*  105* 117*  BUN 43* 41* 38* 35* 34*  34* 36*  CREATININE 1.86* 1.77* 1.75* 1.65* 1.67*  1.73* 1.76*  CALCIUM 7.6* 7.6* 7.6* 7.7* 7.5*  7.6* 7.5*  MG 1.8 1.7 1.7 2.1 2.1  --   PHOS 2.5 2.7 3.7 3.9 3.8 2.8   Liver Function Tests: Recent Labs  Lab 11/14/17 1713 11/14/17 2105 11/15/17 0149 11/15/17 0533 11/15/17 1302  ALBUMIN 1.3* 1.3* 1.2* 1.2* 1.2*    No results for input(s): AMMONIA in the last 168 hours. CBC: Recent Labs  Lab 11/09/17 0733 11/10/17 0503 11/12/17 0556 11/13/17 0450 11/14/17 0537 11/15/17 0533  WBC 24.8* 30.7* 14.1* 15.5* 13.7* 9.8  NEUTROABS 23.1* 28.5*  --   --   --   --   HGB 11.9* 11.6* 10.2* 9.0* 7.8* 7.9*  HCT 35.1* 34.6* 30.3* 25.7* 23.3* 23.5*  MCV 96.6 94.7 96.3 92.6 94.5 97.0  PLT 219 252 182 193 168 174   4CBG: Recent Labs  Lab 11/15/17 0158 11/15/17 0428 11/15/17 0752 11/15/17 0820 11/15/17 1206  GLUCAP 115* 107* 62* 99 107*    Recent Results (from the past 240 hour(s))  Culture, respiratory (NON-Expectorated)     Status: None   Collection  Time: 11/08/17  5:14 AM  Result Value Ref Range Status   Specimen Description   Final    TRACHEAL ASPIRATE Performed at [facility], [address] [place], [place] [address]    Special Requests   Final    NONE Performed at [facility], [place], [place] [address]    Gram Stain   Final    FEW WBC PRESENT, PREDOMINANTLY PMN ABUNDANT GRAM POSITIVE COCCI ABUNDANT GRAM NEGATIVE RODS Performed at [facility], [place] [address], [place], [place] [address]    Culture ABUNDANT STAPHYLOCOCCUS AUREUS  Final   Report Status 11/10/2017 FINAL  Final   Organism ID, Bacteria STAPHYLOCOCCUS AUREUS  Final      Susceptibility   Staphylococcus aureus - MIC*    CIPROFLOXACIN <=0.5 SENSITIVE Sensitive     ERYTHROMYCIN <=0.25 SENSITIVE Sensitive     GENTAMICIN <=0.5 SENSITIVE Sensitive     OXACILLIN 0.5  SENSITIVE Sensitive     TETRACYCLINE <=1 SENSITIVE Sensitive     VANCOMYCIN 1 SENSITIVE Sensitive     TRIMETH/SULFA <=10 SENSITIVE Sensitive     CLINDAMYCIN <=0.25 SENSITIVE Sensitive     RIFAMPIN <=0.5 SENSITIVE Sensitive     Inducible Clindamycin NEGATIVE Sensitive     * ABUNDANT STAPHYLOCOCCUS AUREUS  Urine Culture     Status: Abnormal   Collection Time: 11/10/17  6:34 AM  Result Value Ref Range Status   Specimen Description   Final    URINE, RANDOM Performed at [facility], [address] [place], [place] [address]    Special Requests   Final    NONE Performed at [facility], [address] [place], [place] [address]    Culture (A)  Final    20,000 COLONIES/mL STAPHYLOCOCCUS SPECIES (COAGULASE NEGATIVE)   Report Status 11/12/2017 FINAL  Final   Organism ID, Bacteria STAPHYLOCOCCUS SPECIES (COAGULASE NEGATIVE) (A)  Final      Susceptibility   Staphylococcus species (coagulase negative) - MIC*    CIPROFLOXACIN <=0.5 SENSITIVE Sensitive     GENTAMICIN <=0.5 SENSITIVE Sensitive     NITROFURANTOIN <=16 SENSITIVE Sensitive     OXACILLIN <=0.25 SENSITIVE Sensitive     TETRACYCLINE <=1 SENSITIVE Sensitive     VANCOMYCIN <=0.5 SENSITIVE Sensitive     TRIMETH/SULFA <=10 SENSITIVE Sensitive     CLINDAMYCIN 0.5 SENSITIVE Sensitive     RIFAMPIN <=0.5 SENSITIVE Sensitive     Inducible Clindamycin NEGATIVE Sensitive     * 20,000 COLONIES/mL STAPHYLOCOCCUS SPECIES (COAGULASE NEGATIVE)  CULTURE, BLOOD (ROUTINE X 2) w Reflex to ID Panel     Status: None   Collection Time: 11/10/17  8:15 AM  Result Value Ref Range Status   Specimen Description BLOOD BLOOD LEFT HAND  Final   Special Requests   Final    BOTTLES DRAWN AEROBIC AND ANAEROBIC Blood Culture adequate volume   Culture   Final    NO GROWTH 5 DAYS Performed at [facility], [address] [place],  [address]    Report Status 11/15/2017 FINAL  Final  CULTURE, BLOOD (ROUTINE X 2) w Reflex  to ID Panel     Status: None   Collection Time: 11/10/17  8:25 AM  Result Value Ref Range Status   Specimen Description BLOOD BLOOD RIGHT HAND  Final   Special Requests   Final    BOTTLES DRAWN AEROBIC AND ANAEROBIC Blood Culture adequate volume   Culture   Final    NO GROWTH 5  DAYS Performed at [facility], [address] [place], [place] [address]    Report Status 11/15/2017 FINAL  Final  Culture, respiratory (NON-Expectorated)     Status: None   Collection Time: 11/10/17 11:13 AM  Result Value Ref Range Status   Specimen Description   Final    TRACHEAL ASPIRATE Performed at [facility], [address], [place], [place] [address]    Special Requests   Final    NONE Performed at [facility], [address] [place], [place] [address]    Gram Stain   Final    RARE WBC PRESENT, PREDOMINANTLY PMN RARE GRAM NEGATIVE COCCOBACILLI Performed at [facility], [place] [address], [place], [place] [address]    Culture MODERATE STAPHYLOCOCCUS AUREUS  Final   Report Status 11/12/2017 FINAL  Final   Organism ID, Bacteria STAPHYLOCOCCUS AUREUS  Final      Susceptibility   Staphylococcus aureus - MIC*    CIPROFLOXACIN <=0.5 SENSITIVE Sensitive     ERYTHROMYCIN <=0.25 SENSITIVE Sensitive     GENTAMICIN <=0.5 SENSITIVE Sensitive     OXACILLIN 0.5 SENSITIVE Sensitive     TETRACYCLINE <=1 SENSITIVE Sensitive     VANCOMYCIN <=0.5 SENSITIVE Sensitive     TRIMETH/SULFA <=10 SENSITIVE Sensitive     CLINDAMYCIN <=0.25 SENSITIVE Sensitive     RIFAMPIN <=0.5 SENSITIVE Sensitive     Inducible Clindamycin NEGATIVE Sensitive     * MODERATE STAPHYLOCOCCUS AUREUS  Aerobic/Anaerobic Culture (surgical/deep wound)     Status: None (Preliminary result)   Collection Time: 11/11/17  6:01 PM  Result Value Ref Range Status   Specimen Description   Final    WOUND Performed at [facility], [address] [place], [place] [address]    Special Requests   Final     Normal Performed at [facility], [address] [place], [place] [address]    Gram Stain   Final    ABUNDANT WBC PRESENT, PREDOMINANTLY PMN MODERATE GRAM POSITIVE COCCI RARE GRAM POSITIVE RODS    Culture   Final    FEW STAPHYLOCOCCUS AUREUS FEW STREPTOCOCCUS GROUP C HOLDING FOR POSSIBLE ANAEROBE Performed at [facility], [place] [address], [place], [place] [address]    Report Status PENDING  Incomplete   Organism ID, Bacteria STAPHYLOCOCCUS AUREUS  Final      Susceptibility   Staphylococcus aureus - MIC*    CIPROFLOXACIN <=0.5 SENSITIVE Sensitive     ERYTHROMYCIN <=0.25 SENSITIVE Sensitive     GENTAMICIN <=0.5 SENSITIVE Sensitive     OXACILLIN 0.5 SENSITIVE Sensitive     TETRACYCLINE <=1 SENSITIVE Sensitive     VANCOMYCIN <=0.5 SENSITIVE Sensitive     TRIMETH/SULFA <=10 SENSITIVE Sensitive     CLINDAMYCIN <=0.25 SENSITIVE Sensitive     RIFAMPIN <=0.5 SENSITIVE Sensitive     Inducible Clindamycin NEGATIVE Sensitive     * FEW STAPHYLOCOCCUS AUREUS     Studies: Dg Eye Foreign Body  Result Date: 11/15/2017 CLINICAL DATA:  Metal working/exposure; clearance prior to MRI EXAM: ORBITS FOR FOREIGN BODY - 2 VIEW COMPARISON:  None. FINDINGS: There is no evidence of metallic foreign body within the orbits. No significant bone abnormality identified. IMPRESSION: No evidence of metallic foreign body within the orbits. Electronically Signed   By: Kerby Moors M.D.   On: 11/15/2017 10:38   Dg Chest Port 1 View  Result Date: 11/15/2017 CLINICAL DATA:  Patient admitted 11/06/2017 for pneumonia and alcohol withdrawal. EXAM: PORTABLE CHEST 1  VIEW COMPARISON:  Single-view of the chest 11/14/2017, 11/13/2017 and 11/12/2017. CT chest 11/11/2017. FINDINGS: Support tubes and lines including a pigtail catheter in the right chest are unchanged. Right pleural effusion has decreased since yesterday's study. Extensive airspace disease in the right chest persists. The left lung appears  clear. No pneumothorax. Heart size is normal. There is some subcutaneous emphysema over the right chest. IMPRESSION: Support tubes and lines are unchanged.  Negative for pneumothorax. Right pleural effusion is decreased since yesterday's examination. No change in extensive right side airspace disease. Electronically Signed   By: Inge Rise M.D.   On: 11/15/2017 07:45   Dg Chest Port 1 View  Result Date: 11/14/2017 CLINICAL DATA:  Acute respiratory failure. EXAM: PORTABLE CHEST 1 VIEW COMPARISON:  11/13/2017 FINDINGS: Endotracheal tube terminates 3 cm above the carina. Left jugular catheter terminates over the high right atrium. Enteric tube courses into the left upper abdomen with tip not imaged. Right pigtail pleural catheter remains in place. A large, partially loculated right pleural effusion has further increased in size with worsening right lung aeration, particularly in the base. The left lung remains grossly clear. The left costophrenic angle was incompletely imaged. No pneumothorax is identified. IMPRESSION: Increasing size of large right pleural effusion with worsening right lung aeration. Electronically Signed   By: Logan Bores M.D.   On: 11/14/2017 07:44    Scheduled Meds: . alteplase (TPA) for intrapleural administration  10 mg Intrapleural Q12H   And  . pulmozyme (DORNASE) for intrapleural administration  5 mg Intrapleural Q12H  . chlorhexidine gluconate (MEDLINE KIT)  15 mL Mouth Rinse BID  . feeding supplement (PRO-STAT SUGAR FREE 64)  30 mL Per Tube TID  . feeding supplement (VITAL 1.5 CAL)  1,000 mL Per Tube Q24H  . folic acid  1 mg Per Tube Daily  . heparin injection (subcutaneous)  5,000 Units Subcutaneous Q8H  . insulin aspart  0-9 Units Subcutaneous Q4H  . lactulose  30 g Per Tube BID  . mouth rinse  15 mL Mouth Rinse 10 times per day  . multivitamin  15 mL Per Tube Daily  . pantoprazole sodium  40 mg Per Tube Daily  . rifaximin  550 mg Per Tube BID  . senna-docusate   2 tablet Per Tube BID  . thiamine  100 mg Per Tube Daily   Continuous Infusions: .  ceFAZolin (ANCEF) IV    . dextrose 50 mL/hr at 11/15/17 0500  . small volume/piggyback builder Stopped (11/15/17 1100)  . phenylephrine (NEO-SYNEPHRINE) Adult infusion 50 mcg/min (11/15/17 1247)  . propofol (DIPRIVAN) infusion Stopped (11/15/17 0830)  . pureflow 2,500 mL/hr at 11/15/17 0256    Assessment/Plan:  1. Delirium tremens and alcohol withdrawal.  Continue current therapy 2. Septic shock with Mssa pneumonia on cefazolin.    Continue pressors and IV antibiotic consider ID input continue to have fevers 3. SVT, atrial fibrillation, nonsustained ventricular tachycardia.  Continue amiodarone drip. 4. Acute to subacute stroke with acute encephalopathy and not waking up once off sedation.  Appreciate neurology input prognosis poor 5. Acute hypoxic respiratory failure.  Continue vent support 6. Angioedema secondary to lisinopril.  This has improved with supportive care. 7. Acute kidney injury.  Nephrology following continue CRRT  8. Hypernatremia on free water 9. NG tube feeding 10. Hypomagnesemia replaced  Code Status:     Code Status Orders  (From admission, onward)        Start     Ordered   11/07/2017 2322  Full code  Continuous     11/08/2017 2321    Code Status History    This patient has a current code status but no historical code status.     Family Communication: As per critical care specialist  disposition Plan: To be determined  Consultants:  Critical care team  Time spent: 25 minutes Case discussed with critical care specialist.  [facility]

## 2017-11-15 NOTE — Progress Notes (Signed)
Subjective: Patient with no significant clinical improvement.  Taken off sedation today.    Objective: Current vital signs: BP (!) 135/116   Pulse (!) 102   Temp (!) 100.4 F (38 C)   Resp (!) 23   Ht 6' 1"  (1.854 m)   Wt 100.3 kg (221 lb 1.9 oz)   SpO2 100%   BMI 29.17 kg/m  Vital signs in last 24 hours: Temp:  [95.9 F (35.5 C)-101.3 F (38.5 C)] 100.4 F (38 C) (04/22 1100) Pulse Rate:  [49-114] 102 (04/22 1100) Resp:  [17-29] 23 (04/22 1100) BP: (72-135)/(45-116) 135/116 (04/22 1100) SpO2:  [93 %-100 %] 100 % (04/22 1100) FiO2 (%):  [21 %-35 %] 21 % (04/22 0811) Weight:  [100.3 kg (221 lb 1.9 oz)] 100.3 kg (221 lb 1.9 oz) (04/22 0500)  Intake/Output from previous day: 04/21 0701 - 04/22 0700 In: 8060.5 [I.V.:6950.5; NG/GT:555; IV Piggyback:555] Out: 1990 [Urine:170; Chest Tube:1820] Intake/Output this shift: Total I/O In: 72.9 [I.V.:72.9] Out: 600 [Chest Tube:600] Nutritional status: No diet orders on file  Neurologic Exam: Mental Status: Patient does not respond to verbal stimuli. Does not respond to deep sternal rub. Does not follow commands. No verbalizations noted.  Cranial Nerves: II: patient does not respond confrontation bilaterally, pupils right5m, left 370mand reactivebilaterally III,IV,VI: doll's responsepresent bilaterally.  V,VII: corneal reflexabsentbilaterally VIII: patient does not respond to verbal stimuli IX,X: gag reflexreduced, XI: trapezius strength unable to test bilaterally XII: tongue strength unable to test Motor: Extremities flaccid throughout. No spontaneous movement noted. No purposeful movements noted. Sensory: Does not respond to noxious stimuli in any extremity.   Lab Results: Basic Metabolic Panel: Recent Labs  Lab 11/14/17 1257 11/14/17 1713 11/14/17 2105 11/15/17 0149 11/15/17 0533  NA 134* 135 135 134* 135  K 4.1 4.1 4.3 4.5 4.7  CL 102 102 101 100* 102  CO2 28 28 28 28 27   GLUCOSE 113* 115* 111*  114* 105*  BUN 43* 41* 38* 35* 34*  CREATININE 1.86* 1.77* 1.75* 1.65* 1.73*  CALCIUM 7.6* 7.6* 7.6* 7.7* 7.6*  MG 1.8 1.7 1.7 2.1 2.1  PHOS 2.5 2.7 3.7 3.9 3.8    Liver Function Tests: Recent Labs  Lab 11/14/17 1257 11/14/17 1713 11/14/17 2105 11/15/17 0149 11/15/17 0533  ALBUMIN 1.4* 1.3* 1.3* 1.2* 1.2*   No results for input(s): LIPASE, AMYLASE in the last 168 hours. No results for input(s): AMMONIA in the last 168 hours.  CBC: Recent Labs  Lab 11/09/17 0733 11/10/17 0503 11/12/17 0556 11/13/17 0450 11/14/17 0537 11/15/17 0533  WBC 24.8* 30.7* 14.1* 15.5* 13.7* 9.8  NEUTROABS 23.1* 28.5*  --   --   --   --   HGB 11.9* 11.6* 10.2* 9.0* 7.8* 7.9*  HCT 35.1* 34.6* 30.3* 25.7* 23.3* 23.5*  MCV 96.6 94.7 96.3 92.6 94.5 97.0  PLT 219 252 182 193 168 174    Cardiac Enzymes: No results for input(s): CKTOTAL, CKMB, CKMBINDEX, TROPONINI in the last 168 hours.  Lipid Panel: Recent Labs  Lab 11/10/17 1709 11/13/17 1737 11/14/17 1257  TRIG 299* 377* 329*    CBG: Recent Labs  Lab 11/14/17 2126 11/15/17 0158 11/15/17 0428 11/15/17 0752 11/15/17 0820  GLUCAP 103* 115* 107* 62* 99    Microbiology: Results for orders placed or performed during the hospital encounter of 10/25/2017  MRSA PCR Screening     Status: None   Collection Time: 11/03/2017 11:38 PM  Result Value Ref Range Status   MRSA by PCR NEGATIVE NEGATIVE Final  Comment:        The GeneXpert MRSA Assay (FDA approved for NASAL specimens only), is one component of a comprehensive MRSA colonization surveillance program. It is not intended to diagnose MRSA infection nor to guide or monitor treatment for MRSA infections. Performed at [facility], [address] [place], [place] [address]   Culture, respiratory (NON-Expectorated)     Status: None   Collection Time: 11/08/17  5:14 AM  Result Value Ref Range Status   Specimen Description   Final    TRACHEAL ASPIRATE Performed at  [facility], [address], [place], [place] [address]    Special Requests   Final    NONE Performed at [facility], [address] [place], [place] [address]    Gram Stain   Final    FEW WBC PRESENT, PREDOMINANTLY PMN ABUNDANT GRAM POSITIVE COCCI ABUNDANT GRAM NEGATIVE RODS Performed at [facility], [place] [address], [place], [place] [address]    Culture ABUNDANT STAPHYLOCOCCUS AUREUS  Final   Report Status 11/10/2017 FINAL  Final   Organism ID, Bacteria STAPHYLOCOCCUS AUREUS  Final      Susceptibility   Staphylococcus aureus - MIC*    CIPROFLOXACIN <=0.5 SENSITIVE Sensitive     ERYTHROMYCIN <=0.25 SENSITIVE Sensitive     GENTAMICIN <=0.5 SENSITIVE Sensitive     OXACILLIN 0.5 SENSITIVE Sensitive     TETRACYCLINE <=1 SENSITIVE Sensitive     VANCOMYCIN 1 SENSITIVE Sensitive     TRIMETH/SULFA <=10 SENSITIVE Sensitive     CLINDAMYCIN <=0.25 SENSITIVE Sensitive     RIFAMPIN <=0.5 SENSITIVE Sensitive     Inducible Clindamycin NEGATIVE Sensitive     * ABUNDANT STAPHYLOCOCCUS AUREUS  Urine Culture     Status: Abnormal   Collection Time: 11/10/17  6:34 AM  Result Value Ref Range Status   Specimen Description   Final    URINE, RANDOM Performed at [facility], [address] [place], [place] [address]    Special Requests   Final    NONE Performed at [facility], [address] [place],  [address]    Culture (A)  Final    20,000 COLONIES/mL STAPHYLOCOCCUS SPECIES (COAGULASE NEGATIVE)   Report Status 11/12/2017 FINAL  Final   Organism ID, Bacteria STAPHYLOCOCCUS SPECIES (COAGULASE NEGATIVE) (A)  Final      Susceptibility   Staphylococcus species (coagulase negative) - MIC*    CIPROFLOXACIN <=0.5 SENSITIVE Sensitive     GENTAMICIN <=0.5 SENSITIVE Sensitive     NITROFURANTOIN <=16 SENSITIVE Sensitive     OXACILLIN <=0.25 SENSITIVE Sensitive     TETRACYCLINE <=1 SENSITIVE Sensitive     VANCOMYCIN <=0.5 SENSITIVE Sensitive      TRIMETH/SULFA <=10 SENSITIVE Sensitive     CLINDAMYCIN 0.5 SENSITIVE Sensitive     RIFAMPIN <=0.5 SENSITIVE Sensitive     Inducible Clindamycin NEGATIVE Sensitive     * 20,000 COLONIES/mL STAPHYLOCOCCUS SPECIES (COAGULASE NEGATIVE)  CULTURE, BLOOD (ROUTINE X 2) w Reflex to ID Panel     Status: None   Collection Time: 11/10/17  8:15 AM  Result Value Ref Range Status   Specimen Description BLOOD BLOOD LEFT HAND  Final   Special Requests   Final    BOTTLES DRAWN AEROBIC AND ANAEROBIC Blood Culture adequate volume   Culture   Final    NO GROWTH 5 DAYS Performed at [facility], [address] [place],  [address]    Report Status 11/15/2017 FINAL  Final  CULTURE, BLOOD (ROUTINE X 2) w Reflex to ID Panel     Status: None   Collection Time: 11/10/17  8:25 AM  Result Value Ref Range Status   Specimen Description BLOOD BLOOD RIGHT HAND  Final   Special Requests   Final    BOTTLES DRAWN AEROBIC AND ANAEROBIC Blood Culture adequate volume   Culture   Final    NO GROWTH 5 DAYS Performed at [facility], [address], [place], [place] [address]    Report Status 11/15/2017 FINAL  Final  Culture, respiratory (NON-Expectorated)     Status: None   Collection Time: 11/10/17 11:13 AM  Result Value Ref Range Status   Specimen Description   Final    TRACHEAL ASPIRATE Performed at [facility], [address] [place], [place] [address]    Special Requests   Final    NONE Performed at [facility], [address] [place], [place] [address]    Gram Stain   Final    RARE WBC PRESENT, PREDOMINANTLY PMN RARE GRAM NEGATIVE COCCOBACILLI Performed at [facility], [place] [address] [place], [place] [address]    Culture MODERATE STAPHYLOCOCCUS AUREUS  Final   Report Status 11/12/2017 FINAL  Final   Organism ID, Bacteria STAPHYLOCOCCUS AUREUS  Final      Susceptibility   Staphylococcus aureus - MIC*    CIPROFLOXACIN <=0.5 SENSITIVE Sensitive      ERYTHROMYCIN <=0.25 SENSITIVE Sensitive     GENTAMICIN <=0.5 SENSITIVE Sensitive     OXACILLIN 0.5 SENSITIVE Sensitive     TETRACYCLINE <=1 SENSITIVE Sensitive     VANCOMYCIN <=0.5 SENSITIVE Sensitive     TRIMETH/SULFA <=10 SENSITIVE Sensitive     CLINDAMYCIN <=0.25 SENSITIVE Sensitive     RIFAMPIN <=0.5 SENSITIVE Sensitive     Inducible Clindamycin NEGATIVE Sensitive     * MODERATE STAPHYLOCOCCUS AUREUS  Aerobic/Anaerobic Culture (surgical/deep wound)     Status: None (Preliminary result)   Collection Time: 11/11/17  6:01 PM  Result Value Ref Range Status   Specimen Description   Final    WOUND Performed at [facility], [address], [place], [place] [address]    Special Requests   Final    Normal Performed at [facility] [address] [place],  [address]    Gram Stain   Final    ABUNDANT WBC PRESENT, PREDOMINANTLY PMN MODERATE GRAM POSITIVE COCCI RARE GRAM POSITIVE RODS    Culture   Final    FEW STAPHYLOCOCCUS AUREUS FEW STREPTOCOCCUS GROUP C HOLDING FOR POSSIBLE ANAEROBE Performed at [facility], [place] [address], [place],  [address]    Report Status PENDING  Incomplete   Organism ID, Bacteria STAPHYLOCOCCUS AUREUS  Final      Susceptibility   Staphylococcus aureus - MIC*    CIPROFLOXACIN <=0.5 SENSITIVE Sensitive     ERYTHROMYCIN <=0.25 SENSITIVE Sensitive     GENTAMICIN <=0.5 SENSITIVE Sensitive     OXACILLIN 0.5 SENSITIVE Sensitive     TETRACYCLINE <=1 SENSITIVE Sensitive     VANCOMYCIN <=0.5 SENSITIVE Sensitive     TRIMETH/SULFA <=10 SENSITIVE Sensitive     CLINDAMYCIN <=0.25 SENSITIVE Sensitive     RIFAMPIN <=0.5 SENSITIVE Sensitive     Inducible Clindamycin NEGATIVE Sensitive     * FEW STAPHYLOCOCCUS AUREUS    Coagulation Studies: No results for input(s): LABPROT, INR in the last 72 hours.  Imaging: Dg Eye Foreign Body  Result Date: 11/15/2017 CLINICAL DATA:  Metal working/exposure; clearance prior to  MRI EXAM: ORBITS FOR FOREIGN BODY - 2 VIEW COMPARISON:  None. FINDINGS: There is no evidence of metallic foreign body within the orbits. No significant bone abnormality identified. IMPRESSION: No evidence of metallic foreign body within the orbits. Electronically Signed   By: Kerby Moors M.D.   On: 11/15/2017 10:38   Dg Chest Port 1 View  Result Date: 11/15/2017 CLINICAL DATA:  Patient admitted 11/20/2017 for pneumonia and alcohol withdrawal. EXAM: PORTABLE CHEST 1 VIEW COMPARISON:  Single-view of the chest 11/14/2017, 11/13/2017 and 11/12/2017. CT chest 11/11/2017. FINDINGS: Support tubes and lines including a pigtail catheter in the right chest are unchanged. Right pleural effusion has decreased since yesterday's study. Extensive airspace disease in the right chest persists. The left lung appears clear. No pneumothorax. Heart size is normal. There is some subcutaneous emphysema over the right chest. IMPRESSION: Support tubes and lines are unchanged.  Negative for pneumothorax. Right pleural effusion is decreased since yesterday's examination. No change in extensive right side airspace disease. Electronically Signed   By: Inge Rise M.D.   On: 11/15/2017 07:45   Dg Chest Port 1 View  Result Date: 11/14/2017 CLINICAL DATA:  Acute respiratory failure. EXAM: PORTABLE CHEST 1 VIEW COMPARISON:  11/13/2017 FINDINGS: Endotracheal tube terminates 3 cm above the carina. Left jugular catheter terminates over the high right atrium. Enteric tube courses into the left upper abdomen with tip not imaged. Right pigtail pleural catheter remains in place. A large, partially loculated right pleural effusion has further increased in size with worsening right lung aeration, particularly in the base. The left lung remains grossly clear. The left costophrenic angle was incompletely imaged. No pneumothorax is identified. IMPRESSION: Increasing size of large right pleural effusion with worsening right lung aeration.  Electronically Signed   By: Logan Bores M.D.   On: 11/14/2017 07:44    Medications:  I have reviewed the patient's current medications. Scheduled: . alteplase (TPA) for intrapleural administration  10 mg Intrapleural Q12H   And  . pulmozyme (DORNASE) for intrapleural administration  5 mg Intrapleural Q12H  . chlorhexidine gluconate (MEDLINE KIT)  15 mL Mouth Rinse BID  . feeding supplement (PRO-STAT SUGAR FREE 64)  30 mL Per Tube TID  . feeding supplement (VITAL 1.5 CAL)  1,000 mL Per Tube Q24H  . folic acid  1 mg Per Tube Daily  . heparin injection (subcutaneous)  5,000 Units Subcutaneous Q8H  . insulin aspart  0-9 Units Subcutaneous Q4H  . lactulose  30 g Per Tube BID  . mouth rinse  15 mL Mouth Rinse 10 times per day  . multivitamin  15 mL Per Tube Daily  . pantoprazole sodium  40 mg Per Tube Daily  . rifaximin  550 mg Per Tube BID  . senna-docusate  2 tablet Per Tube BID  . thiamine  100 mg Per Tube Daily    Assessment/Plan: Patient without significant improvement.  EEG with attenuated polymorphic delta activity alternating with higher voltage theta activity.  No evidence of subclinical seizure activity.  MRI of the brain shows an acute left parieto-occipital infarct and multiple small infarcts in the hemispheres bilaterally and right cerebellar hemisphere.  Likely embolic in etiology.  Renal function markedly improved with CRRT.  Continues to have pulmonary issues.   Will discuss with family again their long term plans since patient's likelihood of independent functioning poor at this point.       LOS: 15 days   Alexis Goodell, MD Neurology [phone]  11/15/2017  11:39 AM

## 2017-11-15 NOTE — Plan of Care (Signed)
Patient continues with intubation.  Chest tube in place and patent.  CRRT continues. Patient remains sedated at this time.  Tube feed on hold d/t vomiting.  Will reassess this am.  Esophageal temp probe inserted at midnight to monitor temp. No evidence of seizure activity this shift.    Will continue to monitor.

## 2017-11-15 NOTE — Progress Notes (Signed)
PULMONARY / CRITICAL CARE MEDICINE   Name: Dustin Orozco MRN: [id number] DOB: [birth year]    ADMISSION DATE:  11/21/2017  PT PROFILE:   19 M on chronic ACE inhibitor therapy, nasally intubated intubated by ENT in ED for angioedema with severe macroglossia and lip swelling.  MAJOR EVENTS/TEST RESULTS: 04/08 developed anuric renal failure 04/08 Renal US: no hydro, bladder decompressed 04/09 Renal consultation: "Acute renal failure is likely secondary to severe ATN likely from hypotension.  Renal ultrasound is negative for obstruction" 04/10 Extubated. Tolerated well but requiring [place] O2 @ 6 LPM 04/11 tolerating extubation well.  However, increasing agitated delirium.  Wife reports heavy chronic alcohol use.  Lorazepam initiated.  Persistent agitation.  Dexmedetomidine infusion initiated. 04/12 BIPAP initiated last night 04/14 Re-intubated for worsening hypoxemia and RLL infiltrate with loculated R eff 04/17 remains delerious and intubated  04/18 CT chest: Extensive right hydropneumothorax with severe compressive atelectasis of the right lung 04/18 R pleural catheter placed with 1.8 liters purulent drainage 04/18 CT head: Hypodensity within the left parafalcine parietal lobe, concerning for acute to subacute infarct. Age-indeterminate lacunar infarct in the genu of the left internal capsule 04/18 Neurology consultation 04/18 CRRT initiated 04/20 Patients status was tenuous yesterday. Required central line placement. Also dialysis catheter stopped working, was replaced. Had some twitching activity noted. Given Keppra, Ativan and started on a Versed infusion. 04/21 continued to have some myoclonic activity. Versed was increased and EEG is pending. Keppra was increased. Still on CRRT 04/22 remains comatose.  Sedation held.  Mild clonus.  Remains on vasopressors.  Anuric.  Remains on CRRT.  04/22 EEG:  04/22 MRI brain:   INDWELLING DEVICES:: L IJ HD cath 04/18 >> 04/19 ETT (L naris) 04/07 >>  04/10 ETT (oral) 04/14 >>  R fem trialysis cath 4/19 >>  Pleural catheter 4/18 >>   MICRO DATA: MRSA PCR 04/07 >> NEG Resp 04/15 >> MSSA Urine 04/17 >> coag negative staph Resp 04/17 >> MSSA Blood 04/17 >> NEG Pleural 04/18 >> group C strep, MSSA  ANTIMICROBIALS: Cefepime 04/14 > >04/16 Amp-sulbactam 04/16 >> 04/17 Cefazolin 04/17 >>    SUBJECTIVE:  Comatose, diffuse myoclonic jerking and tremor of LUE.  Myoclonus triggering ventilator   VITAL SIGNS: BP 113/81   Pulse (!) 105   Temp (!) 100.4 F (38 C)   Resp 20   Ht 6\' 1"  (1.854 m)   Wt 221 lb 1.9 oz (100.3 kg)   SpO2 100%   BMI 29.17 kg/m    VENTILATOR SETTINGS: Vent Mode: PCV FiO2 (%):  [21 %-30 %] 21 % Set Rate:  [14 bmp-24 bmp] 14 bmp Vt Set:  [500 mL] 500 mL PEEP:  [5 cmH20-8 cmH20] 5 cmH20 Plateau Pressure:  [11 cmH20-18 cmH20] 11 cmH20 BIPAP for sleep  INTAKE / OUTPUT: I/O last 3 completed shifts: In: 9203.7 [I.V.:7423.6; NG/GT:1225.1; IV Piggyback:555] Out: 2162 [Urine:342; Chest Tube:1820]  PHYSICAL EXAMINATION: General: Intubated, sedated, comatose Neuro: No response to painful stimuli, pupils react, corneals absent HEENT: NCAT, sclerae white Cardiovascular: RRR, no M Lungs: R>L rhonchi, no wheezes Abdomen: Soft, NT, NABS Extremities: Warm, mild symmetric ankle and pedal edema  LABS:  BMET Recent Labs  Lab 11/14/17 2105 11/15/17 0149 11/15/17 0533  NA 135 134* 135  K 4.3 4.5 4.7  CL 101 100* 102  CO2 28 28 27   BUN 38* 35* 34*  CREATININE 1.75* 1.65* 1.73*  GLUCOSE 111* 114* 105*    Electrolytes Recent Labs  Lab 11/14/17 2105 11/15/17 0149 11/15/17 0533  CALCIUM 7.6* 7.7* 7.6*  MG 1.7 2.1 2.1  PHOS 3.7 3.9 3.8    CBC Recent Labs  Lab 11/13/17 0450 11/14/17 0537 11/15/17 0533  WBC 15.5* 13.7* 9.8  HGB 9.0* 7.8* 7.9*  HCT 25.7* 23.3* 23.5*  PLT 193 168 174    Coag's No results for input(s): APTT, INR in the last 168 hours.  Sepsis Markers Recent Labs  Lab  11/10/17 0825  PROCALCITON 4.97    ABG Recent Labs  Lab 11/08/17 1908 11/12/17 0455 11/12/17 0845  PHART 7.40 7.12* 7.25*  PCO2ART 34 84* 58*  PO2ART 63* 99 101    Liver Enzymes Recent Labs  Lab 11/14/17 2105 11/15/17 0149 11/15/17 0533  ALBUMIN 1.3* 1.2* 1.2*    Cardiac Enzymes No results for input(s): TROPONINI, PROBNP in the last 168 hours.  Glucose Recent Labs  Lab 11/14/17 2126 11/15/17 0158 11/15/17 0428 11/15/17 0752 11/15/17 0820 11/15/17 1206  GLUCAP 103* 115* 107* 62* 99 107*    CXR: Persistent large right pleural effusion  ASSESSMENT / PLAN: PULMONARY A: Acute respiratory failure initially due to angioedema  Reintubation due to pneumonia with empyema Hydropneumothorax Angioedema-resolved P:   Cont full vent support - settings reviewed and/or adjusted Cont vent bundle Daily SBT if/when meets criteria Continue pleural catheter drainage Will likely need larger chest tube depending on results of neurologic evaluation   CARDIOVASCULAR A:  Septic shock PSVT, resolved P: Continue phenylephrine to maintain MAP >65 mmHg Discontinue amiodarone infusion 04/22  RENAL A:   AKI, anuric -likely sepsis/hypotension induced ATN P:   Monitor BMET intermittently Monitor I/Os Correct electrolytes as indicated  CRRT per nephrology, discussed with Dr. Wynelle LinkKolluru  GASTROINTESTINAL A:   Recurrent emesis P:   SUP: Enteral pantoprazole Continue TF protocol  INFECTIOUS A: Severe sepsis Staph/group C strep pneumonia Large right empyema P: Monitor temp, WBC count Micro and abx as above   ENDOCRINE A:   Mild hyperglycemia without prior history of DM, controlled P:   Continue sensitive scale SSI  HEMATOLOGY A: ICU acquired anemia without evidence of acute blood loss P: DVT px: SQ heparin Monitor CBC intermittently Transfuse per usual guidelines   NEUROLOGIC A:   History of heavy alcohol use Alcohol withdrawals with DTs Acute/subacute  CVA Acute encephalopathy/coma - unclear etiology ICU/ventilator associated discomfort Possible seizure P: RASS goal: -1, -2 Continue PAD protocol, minimize sedation Continue anticonvulsants Neurology following Initiate lactulose, rifaximin EEG 04/22 MRI 04/22  FAMILY: Brother, sister, mother updated in detail at bedside and goals of care discussed.  We all agree to continue aggressive support for now.  This could change depending on the results of EEG and MRI today.  We also discussed endpoints.  They do not believe that patient would want to pursue long-term mechanical ventilation unless there was a reasonable likelihood of functional recovery   CCM time: 60 mins  The above time includes time spent in consultation with patient and/or family members and reviewing care plan on multidisciplinary rounds  Billy Fischeravid Shanasia Ibrahim, MD PCCM service Mobile [phone] Pager [phone]  11/15/2017 1:02 PM

## 2017-11-16 ENCOUNTER — Inpatient Hospital Stay: Payer: Medicare Other

## 2017-11-16 DIAGNOSIS — R4182 Altered mental status, unspecified: Secondary | ICD-10-CM

## 2017-11-16 DIAGNOSIS — Z7189 Other specified counseling: Secondary | ICD-10-CM

## 2017-11-16 LAB — COMPREHENSIVE METABOLIC PANEL
ALBUMIN: 1.3 g/dL — AB (ref 3.5–5.0)
ALK PHOS: 93 U/L (ref 38–126)
ALT: 6 U/L — AB (ref 17–63)
AST: 45 U/L — AB (ref 15–41)
Anion gap: 6 (ref 5–15)
BUN: 33 mg/dL — ABNORMAL HIGH (ref 6–20)
CO2: 26 mmol/L (ref 22–32)
CREATININE: 1.58 mg/dL — AB (ref 0.61–1.24)
Calcium: 7.6 mg/dL — ABNORMAL LOW (ref 8.9–10.3)
Chloride: 102 mmol/L (ref 101–111)
GFR calc Af Amer: 52 mL/min — ABNORMAL LOW (ref >60)
GFR calc non Af Amer: 45 mL/min — ABNORMAL LOW (ref >60)
GLUCOSE: 102 mg/dL — AB (ref 65–99)
Potassium: 4.3 mmol/L (ref 3.5–5.1)
SODIUM: 134 mmol/L — AB (ref 135–145)
Total Bilirubin: 0.6 mg/dL (ref 0.3–1.2)
Total Protein: 5 g/dL — ABNORMAL LOW (ref 6.5–8.1)

## 2017-11-16 LAB — RENAL FUNCTION PANEL
Albumin: 1.2 g/dL — ABNORMAL LOW (ref 3.5–5.0)
Albumin: 1.2 g/dL — ABNORMAL LOW (ref 3.5–5.0)
Anion gap: 7 (ref 5–15)
Anion gap: 6 (ref 5–15)
BUN: 33 mg/dL — AB (ref 6–20)
BUN: 37 mg/dL — AB (ref 6–20)
CALCIUM: 7.5 mg/dL — AB (ref 8.9–10.3)
CHLORIDE: 102 mmol/L (ref 101–111)
CO2: 24 mmol/L (ref 22–32)
CO2: 25 mmol/L (ref 22–32)
CREATININE: 1.51 mg/dL — AB (ref 0.61–1.24)
Calcium: 7.5 mg/dL — ABNORMAL LOW (ref 8.9–10.3)
Chloride: 103 mmol/L (ref 101–111)
Creatinine, Ser: 1.79 mg/dL — ABNORMAL HIGH (ref 0.61–1.24)
GFR calc Af Amer: 45 mL/min — ABNORMAL LOW (ref >60)
GFR calc Af Amer: 55 mL/min — ABNORMAL LOW (ref >60)
GFR calc non Af Amer: 39 mL/min — ABNORMAL LOW (ref >60)
GFR calc non Af Amer: 47 mL/min — ABNORMAL LOW (ref >60)
GLUCOSE: 101 mg/dL — AB (ref 65–99)
GLUCOSE: 123 mg/dL — AB (ref 65–99)
PHOSPHORUS: 2.3 mg/dL — AB (ref 2.5–4.6)
POTASSIUM: 4.4 mmol/L (ref 3.5–5.1)
Phosphorus: 2.8 mg/dL (ref 2.5–4.6)
Potassium: 4.1 mmol/L (ref 3.5–5.1)
SODIUM: 134 mmol/L — AB (ref 135–145)
Sodium: 133 mmol/L — ABNORMAL LOW (ref 135–145)

## 2017-11-16 LAB — BRAIN NATRIURETIC PEPTIDE: B NATRIURETIC PEPTIDE 5: 291 pg/mL — AB (ref 0.0–100.0)

## 2017-11-16 LAB — GLUCOSE, CAPILLARY
Glucose-Capillary: 98 mg/dL (ref 65–99)
Glucose-Capillary: 89 mg/dL (ref 65–99)
Glucose-Capillary: 102 mg/dL — ABNORMAL HIGH (ref 65–99)
Glucose-Capillary: 105 mg/dL — ABNORMAL HIGH (ref 65–99)

## 2017-11-16 LAB — MAGNESIUM: MAGNESIUM: 1.7 mg/dL (ref 1.7–2.4)

## 2017-11-16 LAB — CBC
HCT: 23.9 % — ABNORMAL LOW (ref 40.0–52.0)
HEMOGLOBIN: 8.1 g/dL — AB (ref 13.0–18.0)
MCH: 32.2 pg (ref 26.0–34.0)
MCHC: 33.9 g/dL (ref 32.0–36.0)
MCV: 95.1 fL (ref 80.0–100.0)
Platelets: 186 10*3/uL (ref 150–440)
RBC: 2.52 MIL/uL — AB (ref 4.40–5.90)
RDW: 14.7 % — ABNORMAL HIGH (ref 11.5–14.5)
WBC: 12.6 10*3/uL — ABNORMAL HIGH (ref 3.8–10.6)

## 2017-11-16 MED ORDER — LORAZEPAM 2 MG/ML IJ SOLN
0.5000 mg | INTRAMUSCULAR | Status: DC | PRN
  Administered 2017-11-16: 1 mg via INTRAVENOUS
  Filled 2017-11-16: qty 1

## 2017-11-16 MED ORDER — FENTANYL CITRATE (PF) 100 MCG/2ML IJ SOLN
INTRAMUSCULAR | Status: AC
  Filled 2017-11-16: qty 2

## 2017-11-16 MED ORDER — MORPHINE BOLUS VIA INFUSION
5.0000 mg | INTRAVENOUS | Status: DC | PRN
  Administered 2017-11-16: 2 mg via INTRAVENOUS
  Administered 2017-11-17 (×3): 5 mg via INTRAVENOUS
  Filled 2017-11-16: qty 5

## 2017-11-16 MED ORDER — MORPHINE 100MG IN NS 100ML (1MG/ML) PREMIX INFUSION
5.0000 mg/h | INTRAVENOUS | Status: DC
  Administered 2017-11-16: 5 mg/h via INTRAVENOUS
  Administered 2017-11-17: 10 mg/h via INTRAVENOUS
  Filled 2017-11-16 (×2): qty 100

## 2017-11-16 MED ORDER — LORAZEPAM 2 MG/ML IJ SOLN: 2.0000 mg | INTRAMUSCULAR | Status: DC | PRN

## 2017-11-16 MED ORDER — FENTANYL CITRATE (PF) 100 MCG/2ML IJ SOLN: 25.0000 ug | INTRAMUSCULAR | Status: DC | PRN

## 2017-11-16 NOTE — Progress Notes (Addendum)
Family requested extubation approx 1715. Dr Sung AmabileSimonds verbal order, pt extubated to room air approx 1725 by RT Shannon, mouth suctioned for comfort.  Pt resting quietly, morphine gtt at 8 mg/hr.  Family at bedside, they decline a bereavement cart at this time

## 2017-11-16 NOTE — Progress Notes (Signed)
Patient extubated at 1725 to comfort care at family request.

## 2017-11-16 NOTE — Progress Notes (Addendum)
CRRT filter clotted at 0930. We will wait until family meeting is concluded to make decision to stop CRRT and begin comfort measures.  If they choose to continue agressive care we will change out cartridge and restart CRRT.  Drs [facility]yl[facility]Lateef and [facility] aware.

## 2017-11-16 NOTE — Progress Notes (Signed)
PULMONARY / CRITICAL CARE MEDICINE   Name: Dustin Orozco MRN: [id number] DOB: Sep 22, 1955    ADMISSION DATE:  11/10/2017  PT PROFILE:   [age] M on chronic ACE inhibitor therapy, nasally intubated intubated by ENT in ED for angioedema with severe macroglossia and lip swelling.  MAJOR EVENTS/TEST RESULTS: 04/08 developed anuric renal failure 04/08 Renal US: no hydro, bladder decompressed 04/09 Renal consultation: "Acute renal failure is likely secondary to severe ATN likely from hypotension.  Renal ultrasound is negative for obstruction" 04/10 Extubated. Tolerated well but requiring [facility] O2 @ 6 LPM 04/11 tolerating extubation well.  However, increasing agitated delirium.  Wife reports heavy chronic alcohol use.  Lorazepam initiated.  Persistent agitation.  Dexmedetomidine infusion initiated. 04/12 BIPAP initiated last night 04/14 Re-intubated for worsening hypoxemia and RLL infiltrate with loculated R eff 04/17 remains delerious and intubated  04/18 CT chest: Extensive right hydropneumothorax with severe compressive atelectasis of the right lung 04/18 R pleural catheter placed with 1.8 liters purulent drainage 04/18 CT head: Hypodensity within the left parafalcine parietal lobe, concerning for acute to subacute infarct. Age-indeterminate lacunar infarct in the genu of the left internal capsule 04/18 Neurology consultation 04/18 CRRT initiated 04/20 Patients status was tenuous yesterday. Required central line placement. Also dialysis catheter stopped working, was replaced. Had some twitching activity noted. Given Keppra, Ativan and started on a Versed infusion. 04/21 continued to have some myoclonic activity. Versed was increased and EEG is pending. Keppra was increased. Still on CRRT 04/22 remains comatose.  Sedation held.  Mild clonus.  Remains on vasopressors.  Anuric.  Remains on CRRT.  04/22 EEG: attenuated, slow background that has a burst suppression character, that at times is isolated to the  left hemisphere.   04/22 MRI brain: Acute to early subacute ischemic nonhemorrhagic infarct involving the left parieto-occipital region, with additional multifocal scattered ischemic infarcts involving the bilateral cerebral and right cerebellar hemispheres as above 04/23 extended discussion with family which included CCM MD and neurologist.  Findings of MRI conveyed to the family.  Poor prognosis for functional recovery conveyed as well.  Family opted for terminal extubation and comfort measures only.  INDWELLING DEVICES:: L IJ HD cath 04/18 >> 04/19 ETT (L naris) 04/07 >> 04/10 ETT (oral) 04/14 >>  R fem trialysis cath 4/19 >>  Pleural catheter 4/18 >>   MICRO DATA: MRSA PCR 04/07 >> NEG Resp 04/15 >> MSSA Urine 04/17 >> coag negative staph Resp 04/17 >> MSSA Blood 04/17 >> NEG Pleural 04/18 >> group C strep, MSSA  ANTIMICROBIALS: Cefepime 04/14 > >04/16 Amp-sulbactam 04/16 >> 04/17 Cefazolin 04/17 >>    SUBJECTIVE:  Remains minimally responsive despite no sedation for past 24 hours.    VITAL SIGNS: BP (!) 149/87   Pulse (!) 113   Temp 97.7 F (36.5 C) (Oral)   Resp (!) 21   Ht 6\' 1"  (1.854 m)   Wt 218 lb 7.6 oz (99.1 kg)   SpO2 100%   BMI 28.82 kg/m    VENTILATOR SETTINGS: Vent Mode: PCV FiO2 (%):  [21 %] 21 % Set Rate:  [14 bmp] 14 bmp PEEP:  [5 cmH20] 5 cmH20 Pressure Support:  [5 cmH20] 5 cmH20 Plateau Pressure:  [16 cmH20-22 cmH20] 22 cmH20 BIPAP for sleep  INTAKE / OUTPUT: I/O last 3 completed shifts: In: 3980.5 [I.V.:3507.1; NG/GT:373.3; IV Piggyback:100] Out: 2442 [Urine:630; Chest Tube:1812]  PHYSICAL EXAMINATION: General: Intubated, sedated, minimally responsive Neuro: No withdrawal from pain HEENT: NCAT, sclerae white Cardiovascular: RRR, no M Lungs: Bilateral  rhonchi Abdomen: Soft, NT, NABS Extremities: Warm, mild symmetric ankle and pedal edema  LABS:  BMET Recent Labs  Lab 11/16/17 0050 11/16/17 0331 11/16/17 0912  NA 133* 134*  134*  K 4.4 4.3 4.1  CL 102 102 103  CO2 24 26 25   BUN 37* 33* 33*  CREATININE 1.79* 1.58* 1.51*  GLUCOSE 101* 102* 123*    Electrolytes Recent Labs  Lab 11/15/17 0533  11/15/17 1741 11/16/17 0050 11/16/17 0331 11/16/17 0912  CALCIUM 7.5*  7.6*   < > 7.3* 7.5* 7.6* 7.5*  MG 2.1  --  1.9  --  1.7  --   PHOS 3.8   < > 3.4 2.8  --  2.3*   < > = values in this interval not displayed.    CBC Recent Labs  Lab 11/14/17 0537 11/15/17 0533 11/16/17 0331  WBC 13.7* 9.8 12.6*  HGB 7.8* 7.9* 8.1*  HCT 23.3* 23.5* 23.9*  PLT 168 174 186    Coag's No results for input(s): APTT, INR in the last 168 hours.  Sepsis Markers Recent Labs  Lab 11/10/17 0825  PROCALCITON 4.97    ABG Recent Labs  Lab 11/12/17 0455 11/12/17 0845  PHART 7.12* 7.25*  PCO2ART 84* 58*  PO2ART 99 101    Liver Enzymes Recent Labs  Lab 11/16/17 0050 11/16/17 0331 11/16/17 0912  AST  --  45*  --   ALT  --  6*  --   ALKPHOS  --  93  --   BILITOT  --  0.6  --   ALBUMIN 1.2* 1.3* 1.2*    Cardiac Enzymes No results for input(s): TROPONINI, PROBNP in the last 168 hours.  Glucose Recent Labs  Lab 11/15/17 1739 11/15/17 1949 11/16/17 0027 11/16/17 0331 11/16/17 0842 11/16/17 1157  GLUCAP 94 103* 98 89 102* 105*    CXR: No significant change persistent mod-large right pleural effusion  ASSESSMENT / PLAN: Acute respiratory failure initially due to angioedema  Angioedema-resolved Reintubation due to pneumonia with empyema Staph/group C strep pneumonia Hydropneumothorax/empyema Severe sepsis/septic shock PSVT, resolved AKI, anuric -likely sepsis/hypotension induced ATN Recurrent emesis/ intolerance to TFs  Mild hyperglycemia without prior history of DM ICU acquired anemia without evidence of acute blood loss History of heavy alcohol use Alcohol withdrawals with DTs Acute/subacute CVA Acute encephalopathy/coma  Bilateral, innumerable multifocal ischemic  infarcts ICU/ventilator associated discomfort Possible seizure P: Extended discussion with multiple family members and girlfriend.  All are in agreement to proceed with terminal extubation and comfort measures only.  We will await the arrival of patient's daughter.  All questions answered.  Dustin Fischer, MD PCCM service Mobile [phone] Pager [phone]  11/16/2017 2:05 PM

## 2017-11-16 NOTE — Progress Notes (Signed)
Pt transitioned to comfort care, but will remain ventilated until a particular visitor the family wishes to see arrives to room.  Morphine gtt initiated at 5 mg/hr, patient bathed and clean linen placed on bed.  Foley, flexi seal and Pleura vac in place.  NG tube pulled, Heparin withdrawn from Vas Cath, lumens flushed and red capped.  CRRT and other extraneous equipment removed from room.  Family at bedside.

## 2017-11-16 NOTE — Progress Notes (Signed)
Patient ID: Dustin Orozco, male   DOB: [birth year], 62 y.o.   MRN: [id number]  [employer] PROGRESS NOTE  Bernarr Longsworth WUJ:[id number] DOB: 1955-09-19 DOA: 15-Nov-2017 PCP: [facility]  HPI/Subjective: Patient noted to have multiple embolic CVAs on -MRI Off sedation  Objective: Vitals:   11/16/17 0800 11/16/17 0900  BP: (!) 163/85 (!) 149/91  Pulse: (!) 104 (!) 111  Resp: (!) 25 (!) 27  Temp: 97.7 F (36.5 C)   SpO2: 100% 100%    Filed Weights   11/14/17 0305 11/15/17 0500 11/16/17 0800  Weight: 95.9 kg (211 lb 6.7 oz) 100.3 kg (221 lb 1.9 oz) 99.1 kg (218 lb 7.6 oz)    ROS: Review of Systems  Unable to perform ROS: Acuity of condition   Exam: Physical Exam  HENT:  Nose: No mucosal edema.  Unable to look into mouth  Eyes: Pupils are equal, round, and reactive to light. Conjunctivae are normal.  Neck: No JVD present. Carotid bruit is not present. No edema present. No thyroid mass and no thyromegaly present.  Cardiovascular: S1 normal and S2 normal. An irregularly irregular rhythm present. Exam reveals no gallop.  No murmur heard. Pulses:      Dorsalis pedis pulses are 2+ on the right side, and 2+ on the left side.  Respiratory: No respiratory distress. He has decreased breath sounds. He has no wheezes. He has no rhonchi. He has no rales.  GI: Soft. Bowel sounds are normal. There is no tenderness.  Musculoskeletal:       Right ankle: He exhibits swelling.       Left ankle: He exhibits swelling.  Lymphadenopathy:    He has no cervical adenopathy.  Neurological:  Intubated and any response.  Skin: Skin is warm. No rash noted. Nails show no clubbing.  Psychiatric:  Intubated       Data Reviewed: Basic Metabolic Panel: Recent Labs  Lab 11/14/17 2105 11/15/17 0149 11/15/17 0533 11/15/17 1302 11/15/17 1741 11/16/17 0050 11/16/17 0331 11/16/17 0912  NA 135 134* 134*  135 133* 133* 133* 134* 134*  K 4.3 4.5 4.9  4.7 4.8 5.0 4.4 4.3 4.1  CL 101  100* 101  102 103 101 102 102 103  CO2 28 28 26  27 23 24 24 26 25   GLUCOSE 111* 114* 102*  105* 117* 92 101* 102* 123*  BUN 38* 35* 34*  34* 36* 37* 37* 33* 33*  CREATININE 1.75* 1.65* 1.67*  1.73* 1.76* 1.90* 1.79* 1.58* 1.51*  CALCIUM 7.6* 7.7* 7.5*  7.6* 7.5* 7.3* 7.5* 7.6* 7.5*  MG 1.7 2.1 2.1  --  1.9  --  1.7  --   PHOS 3.7 3.9 3.8 2.8 3.4 2.8  --  2.3*   Liver Function Tests: Recent Labs  Lab 11/15/17 1302 11/15/17 1741 11/16/17 0050 11/16/17 0331 11/16/17 0912  AST  --   --   --  45*  --   ALT  --   --   --  6*  --   ALKPHOS  --   --   --  93  --   BILITOT  --   --   --  0.6  --   PROT  --   --   --  5.0*  --   ALBUMIN 1.2* 1.3* 1.2* 1.3* 1.2*    No results for input(s): AMMONIA in the last 168 hours. CBC: Recent Labs  Lab 11/10/17 0503 11/12/17 0556 11/13/17 0450 11/14/17 0537 11/15/17 0533 11/16/17  0331  WBC 30.7* 14.1* 15.5* 13.7* 9.8 12.6*  NEUTROABS 28.5*  --   --   --   --   --   HGB 11.6* 10.2* 9.0* 7.8* 7.9* 8.1*  HCT 34.6* 30.3* 25.7* 23.3* 23.5* 23.9*  MCV 94.7 96.3 92.6 94.5 97.0 95.1  PLT 252 182 193 168 174 186   4CBG: Recent Labs  Lab 11/15/17 1949 11/16/17 0027 11/16/17 0331 11/16/17 0842 11/16/17 1157  GLUCAP 103* 98 89 102* 105*    Recent Results (from the past 240 hour(s))  Culture, respiratory (NON-Expectorated)     Status: None   Collection Time: 11/08/17  5:14 AM  Result Value Ref Range Status   Specimen Description   Final    TRACHEAL ASPIRATE Performed at [facility], [address] [place], [place] [address]    Special Requests   Final    NONE Performed at [facility], [address] [place], [place] [address]    Gram Stain   Final    FEW WBC PRESENT, PREDOMINANTLY PMN ABUNDANT GRAM POSITIVE COCCI ABUNDANT GRAM NEGATIVE RODS Performed at [facility], [address], [place], [place] [address]    Culture ABUNDANT STAPHYLOCOCCUS AUREUS  Final   Report Status 11/10/2017 FINAL   Final   Organism ID, Bacteria STAPHYLOCOCCUS AUREUS  Final      Susceptibility   Staphylococcus aureus - MIC*    CIPROFLOXACIN <=0.5 SENSITIVE Sensitive     ERYTHROMYCIN <=0.25 SENSITIVE Sensitive     GENTAMICIN <=0.5 SENSITIVE Sensitive     OXACILLIN 0.5 SENSITIVE Sensitive     TETRACYCLINE <=1 SENSITIVE Sensitive     VANCOMYCIN 1 SENSITIVE Sensitive     TRIMETH/SULFA <=10 SENSITIVE Sensitive     CLINDAMYCIN <=0.25 SENSITIVE Sensitive     RIFAMPIN <=0.5 SENSITIVE Sensitive     Inducible Clindamycin NEGATIVE Sensitive     * ABUNDANT STAPHYLOCOCCUS AUREUS  Urine Culture     Status: Abnormal   Collection Time: 11/10/17  6:34 AM  Result Value Ref Range Status   Specimen Description   Final    URINE, RANDOM Performed at [facility], [address] [place], [place] [address]    Special Requests   Final    NONE Performed at [facility], [address], [place], [place] [address]    Culture (A)  Final    20,000 COLONIES/mL STAPHYLOCOCCUS SPECIES (COAGULASE NEGATIVE)   Report Status 11/12/2017 FINAL  Final   Organism ID, Bacteria STAPHYLOCOCCUS SPECIES (COAGULASE NEGATIVE) (A)  Final      Susceptibility   Staphylococcus species (coagulase negative) - MIC*    CIPROFLOXACIN <=0.5 SENSITIVE Sensitive     GENTAMICIN <=0.5 SENSITIVE Sensitive     NITROFURANTOIN <=16 SENSITIVE Sensitive     OXACILLIN <=0.25 SENSITIVE Sensitive     TETRACYCLINE <=1 SENSITIVE Sensitive     VANCOMYCIN <=0.5 SENSITIVE Sensitive     TRIMETH/SULFA <=10 SENSITIVE Sensitive     CLINDAMYCIN 0.5 SENSITIVE Sensitive     RIFAMPIN <=0.5 SENSITIVE Sensitive     Inducible Clindamycin NEGATIVE Sensitive     * 20,000 COLONIES/mL STAPHYLOCOCCUS SPECIES (COAGULASE NEGATIVE)  CULTURE, BLOOD (ROUTINE X 2) w Reflex to ID Panel     Status: None   Collection Time: 11/10/17  8:15 AM  Result Value Ref Range Status   Specimen Description BLOOD BLOOD LEFT HAND  Final   Special Requests   Final    BOTTLES  DRAWN AEROBIC AND ANAEROBIC Blood Culture adequate volume  Culture   Final    NO GROWTH 5 DAYS Performed at [facility], [address] [place], [place] [address]    Report Status 11/15/2017 FINAL  Final  CULTURE, BLOOD (ROUTINE X 2) w Reflex to ID Panel     Status: None   Collection Time: 11/10/17  8:25 AM  Result Value Ref Range Status   Specimen Description BLOOD BLOOD RIGHT HAND  Final   Special Requests   Final    BOTTLES DRAWN AEROBIC AND ANAEROBIC Blood Culture adequate volume   Culture   Final    NO GROWTH 5 DAYS Performed at [facility], [address], [place], [place] [address]    Report Status 11/15/2017 FINAL  Final  Culture, respiratory (NON-Expectorated)     Status: None   Collection Time: 11/10/17 11:13 AM  Result Value Ref Range Status   Specimen Description   Final    TRACHEAL ASPIRATE Performed at [facility], [address], [place], [place] [address]    Special Requests   Final    NONE Performed at [facility], [address] [place], [place] [address]    Gram Stain   Final    RARE WBC PRESENT, PREDOMINANTLY PMN RARE GRAM NEGATIVE COCCOBACILLI Performed at [facility], [address] [place], [place] [address]    Culture MODERATE STAPHYLOCOCCUS AUREUS  Final   Report Status 11/12/2017 FINAL  Final   Organism ID, Bacteria STAPHYLOCOCCUS AUREUS  Final      Susceptibility   Staphylococcus aureus - MIC*    CIPROFLOXACIN <=0.5 SENSITIVE Sensitive     ERYTHROMYCIN <=0.25 SENSITIVE Sensitive     GENTAMICIN <=0.5 SENSITIVE Sensitive     OXACILLIN 0.5 SENSITIVE Sensitive     TETRACYCLINE <=1 SENSITIVE Sensitive     VANCOMYCIN <=0.5 SENSITIVE Sensitive     TRIMETH/SULFA <=10 SENSITIVE Sensitive     CLINDAMYCIN <=0.25 SENSITIVE Sensitive     RIFAMPIN <=0.5 SENSITIVE Sensitive     Inducible Clindamycin NEGATIVE Sensitive     * MODERATE STAPHYLOCOCCUS AUREUS  Aerobic/Anaerobic Culture (surgical/deep wound)      Status: None (Preliminary result)   Collection Time: 11/11/17  6:01 PM  Result Value Ref Range Status   Specimen Description   Final    WOUND Performed at [facility], [address] [place], [place] [address]    Special Requests   Final    Normal Performed at [facility], [address] [place], [place] [address]    Gram Stain   Final    ABUNDANT WBC PRESENT, PREDOMINANTLY PMN MODERATE GRAM POSITIVE COCCI RARE GRAM POSITIVE RODS    Culture   Final    FEW STAPHYLOCOCCUS AUREUS FEW STREPTOCOCCUS GROUP C HOLDING FOR POSSIBLE ANAEROBE Performed at [facility], [address], [place], [place] [address]    Report Status PENDING  Incomplete   Organism ID, Bacteria STAPHYLOCOCCUS AUREUS  Final      Susceptibility   Staphylococcus aureus - MIC*    CIPROFLOXACIN <=0.5 SENSITIVE Sensitive     ERYTHROMYCIN <=0.25 SENSITIVE Sensitive     GENTAMICIN <=0.5 SENSITIVE Sensitive     OXACILLIN 0.5 SENSITIVE Sensitive     TETRACYCLINE <=1 SENSITIVE Sensitive     VANCOMYCIN <=0.5 SENSITIVE Sensitive     TRIMETH/SULFA <=10 SENSITIVE Sensitive     CLINDAMYCIN <=0.25 SENSITIVE Sensitive     RIFAMPIN <=0.5 SENSITIVE Sensitive     Inducible Clindamycin NEGATIVE Sensitive     *  FEW STAPHYLOCOCCUS AUREUS     Studies: Dg Eye Foreign Body  Result Date: 11/15/2017 CLINICAL DATA:  Metal working/exposure; clearance prior to MRI EXAM: ORBITS FOR FOREIGN BODY - 2 VIEW COMPARISON:  None. FINDINGS: There is no evidence of metallic foreign body within the orbits. No significant bone abnormality identified. IMPRESSION: No evidence of metallic foreign body within the orbits. Electronically Signed   By: Signa Kell M.D.   On: 11/15/2017 10:38   Mr Brain Wo Contrast  Result Date: 11/15/2017 CLINICAL DATA:  Initial evaluation for acute altered mental status. EXAM: MRI HEAD WITHOUT CONTRAST TECHNIQUE: Multiplanar, multiecho pulse sequences of the brain and surrounding  structures were obtained without intravenous contrast. COMPARISON:  Prior CT from 11/11/2017. FINDINGS: Brain: Mild age-related cerebral volume loss. Mild chronic small vessel ischemic changes present within the periventricular white matter. Acute to early subacute ischemic infarct involving the parasagittal left parieto-occipital region is seen, measuring 4.3 x 2.0 x 4.4 cm (series 100, image 39). Evidence for early laminar necrosis without associated hemorrhage or significant edema. There are innumerable additional multifocal predominantly subcentimeter ischemic infarcts involving the deep white matter of the bilateral cerebral centrum semi ovale and corona radiata. Acute ischemic changes involve the bilateral globus pallidus act. Additional small cortical infarcts noted at the left temporal occipital region (series 100, image 25). A small 9 mm right cerebellar infarct noted (series 100, image 17). No significant mass effect or associated hemorrhage about these additional infarcts. No mass lesion, midline shift, or mass effect. Ventricles normal size without hydrocephalus. No extra-axial fluid collection. Major dural sinuses grossly patent. Pituitary gland suprasellar region normal. Midline structures intact. Vascular: Major intracranial vascular flow voids are maintained. Skull and upper cervical spine: Craniocervical junction within normal limits. Abnormal T1 hypointensity seen involving the C2 vertebral body, of uncertain significance (series 6, image 15). Bone marrow signal intensity otherwise grossly normal. Scalp soft tissues within normal limits. Sinuses/Orbits: Globes and orbital soft tissues demonstrate no acute abnormality postsurgical changes noted at the right globe. Left paranasal sinusitis noted. Bilateral mastoid effusions. Nasogastric tube in place. Other: None. IMPRESSION: 1. Acute to early subacute ischemic nonhemorrhagic infarct involving the left parieto-occipital region, with additional  multifocal scattered ischemic infarcts involving the bilateral cerebral and right cerebellar hemispheres as above. 2. Left paranasal sinusitis with bilateral mastoid effusions. 3. Apparent abnormal T1 hypointensity involving the C2 vertebral body, of uncertain etiology or significance, but could reflect sequelae of infection, metastasis, or other marrow infiltrative process. Clinical correlation recommended. Additionally, finding could be further assessed with dedicated MRI of the cervical spine as clinically warranted. Electronically Signed   By: Rise Mu M.D.   On: 11/15/2017 16:51   Dg Chest Port 1 View  Result Date: 11/16/2017 CLINICAL DATA:  Hypoxia EXAM: PORTABLE CHEST 1 VIEW COMPARISON:  November 15, 2017 FINDINGS: Endotracheal tube tip is 2.9 cm above the carina. Central catheter tip is at the cavoatrial junction. Nasogastric tube tip and side port below the diaphragm. Pigtail catheter remains present on the right. There is a minimal lateral pneumothorax on the right. There remains loculated effusion on the right with patchy consolidation in portions of the right middle and lower lobes. Left lung is clear. Heart is upper normal in size with pulmonary vascular within normal limits. No adenopathy. No bone lesions. IMPRESSION: Tube and catheter positions as described with minimal pneumothorax on the right laterally. Persistent loculated effusion on the right with areas of patchy consolidation in the right mid lower lung zones. Left lung clear.  Stable cardiac silhouette. Electronically Signed   By: Bretta BangWilliam  Woodruff III M.D.   On: 11/16/2017 07:07   Dg Chest Port 1 View  Result Date: 11/15/2017 CLINICAL DATA:  Patient admitted 2018-01-04 for pneumonia and alcohol withdrawal. EXAM: PORTABLE CHEST 1 VIEW COMPARISON:  Single-view of the chest 11/14/2017, 11/13/2017 and 11/12/2017. CT chest 11/11/2017. FINDINGS: Support tubes and lines including a pigtail catheter in the right chest are unchanged.  Right pleural effusion has decreased since yesterday's study. Extensive airspace disease in the right chest persists. The left lung appears clear. No pneumothorax. Heart size is normal. There is some subcutaneous emphysema over the right chest. IMPRESSION: Support tubes and lines are unchanged.  Negative for pneumothorax. Right pleural effusion is decreased since yesterday's examination. No change in extensive right side airspace disease. Electronically Signed   By: Drusilla Kannerhomas  Dalessio M.D.   On: 11/15/2017 07:45    Scheduled Meds:  Continuous Infusions: . small volume/piggyback builder Stopped (11/16/17 1000)  . morphine 5 mg/hr (11/16/17 1239)    Assessment/Plan:  1. Delirium tremens and alcohol withdrawal.  Continue current therapy 2. Septic shock with Mssa pneumonia on cefazolin.    Continue pressors and IV antibiotic consider ID input continue to have fevers 3. SVT, atrial fibrillation, nonsustained ventricular tachycardia.  Continue amiodarone drip. 4. Acute to subacute stroke with acute encephalopathy and not waking up once off sedation.  Appreciate neurology input prognosis poor 5. Acute hypoxic respiratory failure.  Continue vent support 6. Angioedema secondary to lisinopril.  This has improved with supportive care. 7. Acute kidney injury.  Nephrology following continue CRRT  8. Hypernatremia on free water 9. NG tube feeding 10. Hypomagnesemia replaced  Code Status:     Code Status Orders  (From admission, onward)        Start     Ordered   Mar 31, 2018 2322  Full code  Continuous     Mar 31, 2018 2321    Code Status History    This patient has a current code status but no historical code status.     Family Communication: As per critical care specialist  disposition Plan: To be determined  Consultants:  Critical care team  Time spent: 25 minutes Case discussed with critical care specialist.  Auburn BilberryShreyang Colman [place]  [employer]

## 2017-11-16 NOTE — Progress Notes (Addendum)
Subjective: Patient remains unresponsive on no sedation.    Objective: Current vital signs: BP (!) 149/87   Pulse (!) 113   Temp 97.7 F (36.5 C) (Oral)   Resp (!) 21   Ht 6\' 1"  (1.854 m)   Wt 99.1 kg (218 lb 7.6 oz)   SpO2 100%   BMI 28.82 kg/m  Vital signs in last 24 hours: Temp:  [97.7 F (36.5 C)-100.4 F (38 C)] 97.7 F (36.5 C) (04/23 0800) Pulse Rate:  [104-121] 113 (04/23 1300) Resp:  [15-31] 21 (04/23 1300) BP: (101-178)/(56-159) 149/87 (04/23 1200) SpO2:  [100 %] 100 % (04/23 1300) FiO2 (%):  [21 %] 21 % (04/23 0800) Weight:  [99.1 kg (218 lb 7.6 oz)] 99.1 kg (218 lb 7.6 oz) (04/23 0800)  Intake/Output from previous day: 04/22 0701 - 04/23 0700 In: 1736.3 [I.V.:1322.9; NG/GT:313.3; IV Piggyback:100] Out: 1882 [Urine:620; Chest Tube:1262] Intake/Output this shift: Total I/O In: 371.8 [I.V.:101.8; NG/GT:170; IV Piggyback:100] Out: 1645 [Urine:215; Chest Tube:1430] Nutritional status: No diet orders on file  Neurologic Exam: Mental Status: Patient does not respond to verbal stimuli. Does not respond to deep sternal rub. Does not follow commands. No verbalizations noted.  Cranial Nerves: II: patient does not respond confrontation bilaterally, pupils right48mm, left 62mm,and reactivebilaterally III,IV,VI: doll's responsepresent bilaterally.  V,VII: corneal reflexabsentbilaterally VIII: patient does not respond to verbal stimuli IX,X: gag reflexreduced, XI: trapezius strength unable to test bilaterally XII: tongue strength unable to test Motor: Extremities flaccid throughout. No spontaneous movement noted. No purposeful movements noted. Sensory: Does not respond to noxious stimuli in any extremity.   Lab Results: Basic Metabolic Panel: Recent Labs  Lab 11/14/17 2105 11/15/17 0149 11/15/17 0533 11/15/17 1302 11/15/17 1741 11/16/17 0050 11/16/17 0331 11/16/17 0912  NA 135 134* 134*  135 133* 133* 133* 134* 134*  K 4.3 4.5 4.9  4.7 4.8  5.0 4.4 4.3 4.1  CL 101 100* 101  102 103 101 102 102 103  CO2 28 28 26  27 23 24 24 26 25   GLUCOSE 111* 114* 102*  105* 117* 92 101* 102* 123*  BUN 38* 35* 34*  34* 36* 37* 37* 33* 33*  CREATININE 1.75* 1.65* 1.67*  1.73* 1.76* 1.90* 1.79* 1.58* 1.51*  CALCIUM 7.6* 7.7* 7.5*  7.6* 7.5* 7.3* 7.5* 7.6* 7.5*  MG 1.7 2.1 2.1  --  1.9  --  1.7  --   PHOS 3.7 3.9 3.8 2.8 3.4 2.8  --  2.3*    Liver Function Tests: Recent Labs  Lab 11/15/17 1302 11/15/17 1741 11/16/17 0050 11/16/17 0331 11/16/17 0912  AST  --   --   --  45*  --   ALT  --   --   --  6*  --   ALKPHOS  --   --   --  93  --   BILITOT  --   --   --  0.6  --   PROT  --   --   --  5.0*  --   ALBUMIN 1.2* 1.3* 1.2* 1.3* 1.2*   No results for input(s): LIPASE, AMYLASE in the last 168 hours. No results for input(s): AMMONIA in the last 168 hours.  CBC: Recent Labs  Lab 11/10/17 0503 11/12/17 0556 11/13/17 0450 11/14/17 0537 11/15/17 0533 11/16/17 0331  WBC 30.7* 14.1* 15.5* 13.7* 9.8 12.6*  NEUTROABS 28.5*  --   --   --   --   --   HGB 11.6* 10.2* 9.0* 7.8* 7.9*  8.1*  HCT 34.6* 30.3* 25.7* 23.3* 23.5* 23.9*  MCV 94.7 96.3 92.6 94.5 97.0 95.1  PLT 252 182 193 168 174 186    Cardiac Enzymes: No results for input(s): CKTOTAL, CKMB, CKMBINDEX, TROPONINI in the last 168 hours.  Lipid Panel: Recent Labs  Lab 11/10/17 1709 11/13/17 1737 11/14/17 1257  TRIG 299* 377* 329*    CBG: Recent Labs  Lab 11/15/17 1949 11/16/17 0027 11/16/17 0331 11/16/17 0842 11/16/17 1157  GLUCAP 103* 98 89 102* 105*    Microbiology: Results for orders placed or performed during the hospital encounter of 10/30/2017  MRSA PCR Screening     Status: None   Collection Time: 11/16/2017 11:38 PM  Result Value Ref Range Status   MRSA by PCR NEGATIVE NEGATIVE Final    Comment:        The GeneXpert MRSA Assay (FDA approved for NASAL specimens only), is one component of a comprehensive MRSA colonization surveillance program.  It is not intended to diagnose MRSA infection nor to guide or monitor treatment for MRSA infections. Performed at [facility], [address] [place], [place] [address]   Culture, respiratory (NON-Expectorated)     Status: None   Collection Time: 11/08/17  5:14 AM  Result Value Ref Range Status   Specimen Description   Final    TRACHEAL ASPIRATE Performed at [facility], [address] [place], [place] [address]    Special Requests   Final    NONE Performed at [facility], [address] [place], [place] [address]    Gram Stain   Final    FEW WBC PRESENT, PREDOMINANTLY PMN ABUNDANT GRAM POSITIVE COCCI ABUNDANT GRAM NEGATIVE RODS Performed at [facility], [address], [place], [place] [address]    Culture ABUNDANT STAPHYLOCOCCUS AUREUS  Final   Report Status 11/10/2017 FINAL  Final   Organism ID, Bacteria STAPHYLOCOCCUS AUREUS  Final      Susceptibility   Staphylococcus aureus - MIC*    CIPROFLOXACIN <=0.5 SENSITIVE Sensitive     ERYTHROMYCIN <=0.25 SENSITIVE Sensitive     GENTAMICIN <=0.5 SENSITIVE Sensitive     OXACILLIN 0.5 SENSITIVE Sensitive     TETRACYCLINE <=1 SENSITIVE Sensitive     VANCOMYCIN 1 SENSITIVE Sensitive     TRIMETH/SULFA <=10 SENSITIVE Sensitive     CLINDAMYCIN <=0.25 SENSITIVE Sensitive     RIFAMPIN <=0.5 SENSITIVE Sensitive     Inducible Clindamycin NEGATIVE Sensitive     * ABUNDANT STAPHYLOCOCCUS AUREUS  Urine Culture     Status: Abnormal   Collection Time: 11/10/17  6:34 AM  Result Value Ref Range Status   Specimen Description   Final    URINE, RANDOM Performed at [facility], [address] [place], [place] [address]    Special Requests   Final    NONE Performed at [facility], [address] [place], [place] [address]    Culture (A)  Final    20,000 COLONIES/mL STAPHYLOCOCCUS SPECIES (COAGULASE NEGATIVE)   Report Status 11/12/2017 FINAL  Final   Organism ID, Bacteria  STAPHYLOCOCCUS SPECIES (COAGULASE NEGATIVE) (A)  Final      Susceptibility   Staphylococcus species (coagulase negative) - MIC*    CIPROFLOXACIN <=0.5 SENSITIVE Sensitive     GENTAMICIN <=0.5 SENSITIVE Sensitive     NITROFURANTOIN <=16 SENSITIVE Sensitive     OXACILLIN <=0.25 SENSITIVE Sensitive     TETRACYCLINE <=1 SENSITIVE Sensitive     VANCOMYCIN <=0.5 SENSITIVE Sensitive  TRIMETH/SULFA <=10 SENSITIVE Sensitive     CLINDAMYCIN 0.5 SENSITIVE Sensitive     RIFAMPIN <=0.5 SENSITIVE Sensitive     Inducible Clindamycin NEGATIVE Sensitive     * 20,000 COLONIES/mL STAPHYLOCOCCUS SPECIES (COAGULASE NEGATIVE)  CULTURE, BLOOD (ROUTINE X 2) w Reflex to ID Panel     Status: None   Collection Time: 11/10/17  8:15 AM  Result Value Ref Range Status   Specimen Description BLOOD BLOOD LEFT HAND  Final   Special Requests   Final    BOTTLES DRAWN AEROBIC AND ANAEROBIC Blood Culture adequate volume   Culture   Final    NO GROWTH 5 DAYS Performed at [facility], [address] [place], [place] [address]    Report Status 11/15/2017 FINAL  Final  CULTURE, BLOOD (ROUTINE X 2) w Reflex to ID Panel     Status: None   Collection Time: 11/10/17  8:25 AM  Result Value Ref Range Status   Specimen Description BLOOD BLOOD RIGHT HAND  Final   Special Requests   Final    BOTTLES DRAWN AEROBIC AND ANAEROBIC Blood Culture adequate volume   Culture   Final    NO GROWTH 5 DAYS Performed at [facility], [address] [place], [place] [address]    Report Status 11/15/2017 FINAL  Final  Culture, respiratory (NON-Expectorated)     Status: None   Collection Time: 11/10/17 11:13 AM  Result Value Ref Range Status   Specimen Description   Final    TRACHEAL ASPIRATE Performed at [facility], [address] [place], [place] [address]    Special Requests   Final    NONE Performed at [facility], [address] [place], [place] [address]    Gram Stain   Final     RARE WBC PRESENT, PREDOMINANTLY PMN RARE GRAM NEGATIVE COCCOBACILLI Performed at [facility], [address] [place], [place] [address]    Culture MODERATE STAPHYLOCOCCUS AUREUS  Final   Report Status 11/12/2017 FINAL  Final   Organism ID, Bacteria STAPHYLOCOCCUS AUREUS  Final      Susceptibility   Staphylococcus aureus - MIC*    CIPROFLOXACIN <=0.5 SENSITIVE Sensitive     ERYTHROMYCIN <=0.25 SENSITIVE Sensitive     GENTAMICIN <=0.5 SENSITIVE Sensitive     OXACILLIN 0.5 SENSITIVE Sensitive     TETRACYCLINE <=1 SENSITIVE Sensitive     VANCOMYCIN <=0.5 SENSITIVE Sensitive     TRIMETH/SULFA <=10 SENSITIVE Sensitive     CLINDAMYCIN <=0.25 SENSITIVE Sensitive     RIFAMPIN <=0.5 SENSITIVE Sensitive     Inducible Clindamycin NEGATIVE Sensitive     * MODERATE STAPHYLOCOCCUS AUREUS  Aerobic/Anaerobic Culture (surgical/deep wound)     Status: None (Preliminary result)   Collection Time: 11/11/17  6:01 PM  Result Value Ref Range Status   Specimen Description   Final    WOUND Performed at [facility], [address] [place], [place] [address]    Special Requests   Final    Normal Performed at [facility], [address] [place], [place] [address]    Gram Stain   Final    ABUNDANT WBC PRESENT, PREDOMINANTLY PMN MODERATE GRAM POSITIVE COCCI RARE GRAM POSITIVE RODS    Culture   Final    FEW STAPHYLOCOCCUS AUREUS FEW STREPTOCOCCUS GROUP C HOLDING FOR POSSIBLE ANAEROBE Performed at [facility], [address] [place], [place] [address]    Report Status PENDING  Incomplete   Organism  ID, Bacteria STAPHYLOCOCCUS AUREUS  Final      Susceptibility   Staphylococcus aureus - MIC*    CIPROFLOXACIN <=0.5 SENSITIVE Sensitive     ERYTHROMYCIN <=0.25 SENSITIVE Sensitive     GENTAMICIN <=0.5 SENSITIVE Sensitive     OXACILLIN 0.5 SENSITIVE Sensitive     TETRACYCLINE <=1 SENSITIVE Sensitive     VANCOMYCIN <=0.5 SENSITIVE Sensitive     TRIMETH/SULFA <=10  SENSITIVE Sensitive     CLINDAMYCIN <=0.25 SENSITIVE Sensitive     RIFAMPIN <=0.5 SENSITIVE Sensitive     Inducible Clindamycin NEGATIVE Sensitive     * FEW STAPHYLOCOCCUS AUREUS    Coagulation Studies: No results for input(s): LABPROT, INR in the last 72 hours.  Imaging: Dg Eye Foreign Body  Result Date: 11/15/2017 CLINICAL DATA:  Metal working/exposure; clearance prior to MRI EXAM: ORBITS FOR FOREIGN BODY - 2 VIEW COMPARISON:  None. FINDINGS: There is no evidence of metallic foreign body within the orbits. No significant bone abnormality identified. IMPRESSION: No evidence of metallic foreign body within the orbits. Electronically Signed   By: Signa Kell M.D.   On: 11/15/2017 10:38   Mr Brain Wo Contrast  Result Date: 11/15/2017 CLINICAL DATA:  Initial evaluation for acute altered mental status. EXAM: MRI HEAD WITHOUT CONTRAST TECHNIQUE: Multiplanar, multiecho pulse sequences of the brain and surrounding structures were obtained without intravenous contrast. COMPARISON:  Prior CT from 11/11/2017. FINDINGS: Brain: Mild age-related cerebral volume loss. Mild chronic small vessel ischemic changes present within the periventricular white matter. Acute to early subacute ischemic infarct involving the parasagittal left parieto-occipital region is seen, measuring 4.3 x 2.0 x 4.4 cm (series 100, image 39). Evidence for early laminar necrosis without associated hemorrhage or significant edema. There are innumerable additional multifocal predominantly subcentimeter ischemic infarcts involving the deep white matter of the bilateral cerebral centrum semi ovale and corona radiata. Acute ischemic changes involve the bilateral globus pallidus act. Additional small cortical infarcts noted at the left temporal occipital region (series 100, image 25). A small 9 mm right cerebellar infarct noted (series 100, image 17). No significant mass effect or associated hemorrhage about these additional infarcts. No mass  lesion, midline shift, or mass effect. Ventricles normal size without hydrocephalus. No extra-axial fluid collection. Major dural sinuses grossly patent. Pituitary gland suprasellar region normal. Midline structures intact. Vascular: Major intracranial vascular flow voids are maintained. Skull and upper cervical spine: Craniocervical junction within normal limits. Abnormal T1 hypointensity seen involving the C2 vertebral body, of uncertain significance (series 6, image 15). Bone marrow signal intensity otherwise grossly normal. Scalp soft tissues within normal limits. Sinuses/Orbits: Globes and orbital soft tissues demonstrate no acute abnormality postsurgical changes noted at the right globe. Left paranasal sinusitis noted. Bilateral mastoid effusions. Nasogastric tube in place. Other: None. IMPRESSION: 1. 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 2. Left paranasal sinusitis with bilateral mastoid effusions. 3. Apparent abnormal T1 hypointensity involving the C2 vertebral body, of uncertain etiology or significance, but could reflect sequelae of infection, metastasis, or other marrow infiltrative process. Clinical correlation recommended. Additionally, finding could be further assessed with dedicated MRI of the cervical spine as clinically warranted. Electronically Signed   By: Rise Mu M.D.   On: 11/15/2017 16:51   Dg Chest Port 1 View  Result Date: 11/16/2017 CLINICAL DATA:  Hypoxia EXAM: PORTABLE CHEST 1 VIEW COMPARISON:  November 15, 2017 FINDINGS: Endotracheal tube  tip is 2.9 cm above the carina. Central catheter tip is at the cavoatrial junction. Nasogastric tube tip and side port below the diaphragm. Pigtail catheter remains present on the right. There is a minimal lateral pneumothorax on the right. There  remains loculated effusion on the right with patchy consolidation in portions of the right middle and lower lobes. Left lung is clear. Heart is upper normal in size with pulmonary vascular within normal limits. No adenopathy. No bone lesions. IMPRESSION: Tube and catheter positions as described with minimal pneumothorax on the right laterally. Persistent loculated effusion on the right with areas of patchy consolidation in the right mid lower lung zones. Left lung clear. Stable cardiac silhouette. Electronically Signed   By: Bretta Bang III M.D.   On: 11/16/2017 07:07   Dg Chest Port 1 View  Result Date: 11/15/2017 CLINICAL DATA:  Patient admitted 11-15-2017 for pneumonia and alcohol withdrawal. EXAM: PORTABLE CHEST 1 VIEW COMPARISON:  Single-view of the chest 11/14/2017, 11/13/2017 and 11/12/2017. CT chest 11/11/2017. FINDINGS: Support tubes and lines including a pigtail catheter in the right chest are unchanged. Right pleural effusion has decreased since yesterday's study. Extensive airspace disease in the right chest persists. The left lung appears clear. No pneumothorax. Heart size is normal. There is some subcutaneous emphysema over the right chest. IMPRESSION: Support tubes and lines are unchanged.  Negative for pneumothorax. Right pleural effusion is decreased since yesterday's examination. No change in extensive right side airspace disease. Electronically Signed   By: Drusilla Kanner M.D.   On: 11/15/2017 07:45    Medications: I have reviewed the patient's current medications. Scheduled:   Assessment/Plan: Discussion had with family concerning patient condition and prognosis for functional recovery (which remains poor).  All questions addressed.     LOS: 16 days   Thana Farr, MD Neurology [phone] 11/16/2017  1:58 PM

## 2017-11-16 NOTE — Progress Notes (Signed)
[facility]  ROUNDING NOTE   Subjective:   CRRT being held  Tmax 101.1  Family at bedside.   MRI results reviewed.   Continues on phenylephrine.    Objective:  Vital signs in last 24 hours:  Temp:  [97.7 F (36.5 C)-101.1 F (38.4 C)] 97.7 F (36.5 C) (04/23 0800) Pulse Rate:  [104-121] 111 (04/23 0900) Resp:  [20-31] 27 (04/23 0900) BP: (101-178)/(56-159) 149/91 (04/23 0900) SpO2:  [100 %] 100 % (04/23 0900) FiO2 (%):  [21 %] 21 % (04/23 0800) Weight:  [99.1 kg (218 lb 7.6 oz)] 99.1 kg (218 lb 7.6 oz) (04/23 0800)  Weight change:  Filed Weights   11/14/17 0305 11/15/17 0500 11/16/17 0800  Weight: 95.9 kg (211 lb 6.7 oz) 100.3 kg (221 lb 1.9 oz) 99.1 kg (218 lb 7.6 oz)    Intake/Output: I/O last 3 completed shifts: In: 3980.5 [I.V.:3507.1; NG/GT:373.3; IV Piggyback:100] Out: 7425 [Urine:630; Chest Tube:1812]   Intake/Output this shift:  Total I/O In: 370 [I.V.:100; NG/GT:170; IV Piggyback:100] Out: 720 [Urine:90; Chest Tube:630]  Physical Exam: General: Critically ill   Head: ETT, NGT to suction  Eyes: Pupils no reactive to light  Neck: trachea midline  Lungs:  Scattered rhonchi, PRVC 21%, Right chest tube  Heart: regular  Abdomen:  Soft, nontender, bowel sounds present  Extremities: 2+ peripheral edema.  Neurologic: Intubated, sedated, +myoclonic movements  Skin: No lesions  GU:  Foley with urine  Access:  Left IJ temp catheter 4/18 Dr. Mortimer Fries    Basic Metabolic Panel: Recent Labs  Lab 11/14/17 2105 11/15/17 0149 11/15/17 0533 11/15/17 1302 11/15/17 1741 11/16/17 0050 11/16/17 0331 11/16/17 0912  NA 135 134* 134*  135 133* 133* 133* 134* 134*  K 4.3 4.5 4.9  4.7 4.8 5.0 4.4 4.3 4.1  CL 101 100* 101  102 103 101 102 102 103  CO2 _0 GLUCOSE 111* 114* 102*  105* 117* 92 101* 102* 123*  BUN 38* 35* 34*  34* 36* 37* 37* 33* 33*  CREATININE 1.75* 1.65* 1.67*  1.73* 1.76* 1.90* 1.79* 1.58* 1.51*   CALCIUM 7.6* 7.7* 7.5*  7.6* 7.5* 7.3* 7.5* 7.6* 7.5*  MG 1.7 2.1 2.1  --  1.9  --  1.7  --   PHOS 3.7 3.9 3.8 2.8 3.4 2.8  --  2.3*    Liver Function Tests: Recent Labs  Lab 11/15/17 1302 11/15/17 1741 11/16/17 0050 11/16/17 0331 11/16/17 0912  AST  --   --   --  45*  --   ALT  --   --   --  6*  --   ALKPHOS  --   --   --  93  --   BILITOT  --   --   --  0.6  --   PROT  --   --   --  5.0*  --   ALBUMIN 1.2* 1.3* 1.2* 1.3* 1.2*   No results for input(s): LIPASE, AMYLASE in the last 168 hours. No results for input(s): AMMONIA in the last 168 hours.  CBC: Recent Labs  Lab 11/10/17 0503 11/12/17 0556 11/13/17 0450 11/14/17 0537 11/15/17 0533 11/16/17 0331  WBC 30.7* 14.1* 15.5* 13.7* 9.8 12.6*  NEUTROABS 28.5*  --   --   --   --   --   HGB 11.6* 10.2* 9.0* 7.8* 7.9* 8.1*  HCT 34.6* 30.3* 25.7* 23.3* 23.5* 23.9*  MCV 94.7 96.3 92.6 94.5  97.0 95.1  PLT 252 182 193 168 174 186    Cardiac Enzymes: No results for input(s): CKTOTAL, CKMB, CKMBINDEX, TROPONINI in the last 168 hours.  BNP: Invalid input(s): POCBNP  CBG: Recent Labs  Lab 11/15/17 1739 11/15/17 1949 11/16/17 0027 11/16/17 0331 11/16/17 0842  GLUCAP 94 103* 98 68 102*    Microbiology: Results for orders placed or performed during the hospital encounter of 10/30/2017  MRSA PCR Screening     Status: None   Collection Time: 11/21/2017 11:38 PM  Result Value Ref Range Status   MRSA by PCR NEGATIVE NEGATIVE Final    Comment:        The GeneXpert MRSA Assay (FDA approved for NASAL specimens only), is one component of a comprehensive MRSA colonization surveillance program. It is not intended to diagnose MRSA infection nor to guide or monitor treatment for MRSA infections. Performed at [facility], [address] [place], [place] [address]   Culture, respiratory (NON-Expectorated)     Status: None   Collection Time: 11/08/17  5:14 AM  Result Value Ref Range Status   Specimen  Description   Final    TRACHEAL ASPIRATE Performed at [facility], [address], [place], [place] [address]    Special Requests   Final    NONE Performed at [facility], [address] [place], [place] [address]    Gram Stain   Final    FEW WBC PRESENT, PREDOMINANTLY PMN ABUNDANT GRAM POSITIVE COCCI ABUNDANT GRAM NEGATIVE RODS Performed at [facility], [place] [address] [place], [place] [address]    Culture ABUNDANT STAPHYLOCOCCUS AUREUS  Final   Report Status 11/10/2017 FINAL  Final   Organism ID, Bacteria STAPHYLOCOCCUS AUREUS  Final      Susceptibility   Staphylococcus aureus - MIC*    CIPROFLOXACIN <=0.5 SENSITIVE Sensitive     ERYTHROMYCIN <=0.25 SENSITIVE Sensitive     GENTAMICIN <=0.5 SENSITIVE Sensitive     OXACILLIN 0.5 SENSITIVE Sensitive     TETRACYCLINE <=1 SENSITIVE Sensitive     VANCOMYCIN 1 SENSITIVE Sensitive     TRIMETH/SULFA <=10 SENSITIVE Sensitive     CLINDAMYCIN <=0.25 SENSITIVE Sensitive     RIFAMPIN <=0.5 SENSITIVE Sensitive     Inducible Clindamycin NEGATIVE Sensitive     * ABUNDANT STAPHYLOCOCCUS AUREUS  Urine Culture     Status: Abnormal   Collection Time: 11/10/17  6:34 AM  Result Value Ref Range Status   Specimen Description   Final    URINE, RANDOM Performed at [facility], [address] [place], [place] [address]    Special Requests   Final    NONE Performed at [facility], [address] [place],  [address]    Culture (A)  Final    20,000 COLONIES/mL STAPHYLOCOCCUS SPECIES (COAGULASE NEGATIVE)   Report Status 11/12/2017 FINAL  Final   Organism ID, Bacteria STAPHYLOCOCCUS SPECIES (COAGULASE NEGATIVE) (A)  Final      Susceptibility   Staphylococcus species (coagulase negative) - MIC*    CIPROFLOXACIN <=0.5 SENSITIVE Sensitive     GENTAMICIN <=0.5 SENSITIVE Sensitive     NITROFURANTOIN <=16 SENSITIVE Sensitive     OXACILLIN <=0.25 SENSITIVE Sensitive     TETRACYCLINE <=1  SENSITIVE Sensitive     VANCOMYCIN <=0.5 SENSITIVE Sensitive     TRIMETH/SULFA <=10 SENSITIVE Sensitive     CLINDAMYCIN 0.5 SENSITIVE Sensitive     RIFAMPIN <=0.5 SENSITIVE Sensitive     Inducible Clindamycin NEGATIVE Sensitive     *  20,000 COLONIES/mL STAPHYLOCOCCUS SPECIES (COAGULASE NEGATIVE)  CULTURE, BLOOD (ROUTINE X 2) w Reflex to ID Panel     Status: None   Collection Time: 11/10/17  8:15 AM  Result Value Ref Range Status   Specimen Description BLOOD BLOOD LEFT HAND  Final   Special Requests   Final    BOTTLES DRAWN AEROBIC AND ANAEROBIC Blood Culture adequate volume   Culture   Final    NO GROWTH 5 DAYS Performed at [facility], [address] [place], [place] [address]    Report Status 11/15/2017 FINAL  Final  CULTURE, BLOOD (ROUTINE X 2) w Reflex to ID Panel     Status: None   Collection Time: 11/10/17  8:25 AM  Result Value Ref Range Status   Specimen Description BLOOD BLOOD RIGHT HAND  Final   Special Requests   Final    BOTTLES DRAWN AEROBIC AND ANAEROBIC Blood Culture adequate volume   Culture   Final    NO GROWTH 5 DAYS Performed at [facility], [address] [place], [place] [address]    Report Status 11/15/2017 FINAL  Final  Culture, respiratory (NON-Expectorated)     Status: None   Collection Time: 11/10/17 11:13 AM  Result Value Ref Range Status   Specimen Description   Final    TRACHEAL ASPIRATE Performed at [facility], [address], [place], [place] [address]    Special Requests   Final    NONE Performed at [facility], [address] [place], [place] [address]    Gram Stain   Final    RARE WBC PRESENT, PREDOMINANTLY PMN RARE GRAM NEGATIVE COCCOBACILLI Performed at [facility], [place] [address], [place], [place] [address]    Culture MODERATE STAPHYLOCOCCUS AUREUS  Final   Report Status 11/12/2017 FINAL  Final   Organism ID, Bacteria STAPHYLOCOCCUS AUREUS  Final      Susceptibility    Staphylococcus aureus - MIC*    CIPROFLOXACIN <=0.5 SENSITIVE Sensitive     ERYTHROMYCIN <=0.25 SENSITIVE Sensitive     GENTAMICIN <=0.5 SENSITIVE Sensitive     OXACILLIN 0.5 SENSITIVE Sensitive     TETRACYCLINE <=1 SENSITIVE Sensitive     VANCOMYCIN <=0.5 SENSITIVE Sensitive     TRIMETH/SULFA <=10 SENSITIVE Sensitive     CLINDAMYCIN <=0.25 SENSITIVE Sensitive     RIFAMPIN <=0.5 SENSITIVE Sensitive     Inducible Clindamycin NEGATIVE Sensitive     * MODERATE STAPHYLOCOCCUS AUREUS  Aerobic/Anaerobic Culture (surgical/deep wound)     Status: None (Preliminary result)   Collection Time: 11/11/17  6:01 PM  Result Value Ref Range Status   Specimen Description   Final    WOUND Performed at [facility], [address], [place], [place] [address]    Special Requests   Final    Normal Performed at [facility], [address] [place], [place] [address]    Gram Stain   Final    ABUNDANT WBC PRESENT, PREDOMINANTLY PMN MODERATE GRAM POSITIVE COCCI RARE GRAM POSITIVE RODS    Culture   Final    FEW STAPHYLOCOCCUS AUREUS FEW STREPTOCOCCUS GROUP C HOLDING FOR POSSIBLE ANAEROBE Performed at [facility], [place] [address], [place], [place] [address]    Report Status PENDING  Incomplete   Organism ID, Bacteria STAPHYLOCOCCUS AUREUS  Final      Susceptibility   Staphylococcus aureus - MIC*    CIPROFLOXACIN <=0.5 SENSITIVE Sensitive     ERYTHROMYCIN <=0.25 SENSITIVE Sensitive  GENTAMICIN <=0.5 SENSITIVE Sensitive     OXACILLIN 0.5 SENSITIVE Sensitive     TETRACYCLINE <=1 SENSITIVE Sensitive     VANCOMYCIN <=0.5 SENSITIVE Sensitive     TRIMETH/SULFA <=10 SENSITIVE Sensitive     CLINDAMYCIN <=0.25 SENSITIVE Sensitive     RIFAMPIN <=0.5 SENSITIVE Sensitive     Inducible Clindamycin NEGATIVE Sensitive     * FEW STAPHYLOCOCCUS AUREUS    Coagulation Studies: No results for input(s): LABPROT, INR in the last 72 hours.  Urinalysis: No results for input(s):  COLORURINE, LABSPEC, PHURINE, GLUCOSEU, HGBUR, BILIRUBINUR, KETONESUR, PROTEINUR, UROBILINOGEN, NITRITE, LEUKOCYTESUR in the last 72 hours.  Invalid input(s): APPERANCEUR    Imaging: Dg Eye Foreign Body  Result Date: 11/15/2017 CLINICAL DATA:  Metal working/exposure; clearance prior to MRI EXAM: ORBITS FOR FOREIGN BODY - 2 VIEW COMPARISON:  None. FINDINGS: There is no evidence of metallic foreign body within the orbits. No significant bone abnormality identified. IMPRESSION: No evidence of metallic foreign body within the orbits. Electronically Signed   By: Kerby Moors M.D.   On: 11/15/2017 10:38   Mr Brain Wo Contrast  Result Date: 11/15/2017 CLINICAL DATA:  Initial evaluation for acute altered mental status. EXAM: MRI HEAD WITHOUT CONTRAST TECHNIQUE: Multiplanar, multiecho pulse sequences of the brain and surrounding structures were obtained without intravenous contrast. COMPARISON:  Prior CT from 11/11/2017. FINDINGS: Brain: Mild age-related cerebral volume loss. Mild chronic small vessel ischemic changes present within the periventricular white matter. Acute to early subacute ischemic infarct involving the parasagittal left parieto-occipital region is seen, measuring 4.3 x 2.0 x 4.4 cm (series 100, image 39). Evidence for early laminar necrosis without associated hemorrhage or significant edema. There are innumerable additional multifocal predominantly subcentimeter ischemic infarcts involving the deep white matter of the bilateral cerebral centrum semi ovale and corona radiata. Acute ischemic changes involve the bilateral globus pallidus act. Additional small cortical infarcts noted at the left temporal occipital region (series 100, image 25). A small 9 mm right cerebellar infarct noted (series 100, image 17). No significant mass effect or associated hemorrhage about these additional infarcts. No mass lesion, midline shift, or mass effect. Ventricles normal size without hydrocephalus. No  extra-axial fluid collection. Major dural sinuses grossly patent. Pituitary gland suprasellar region normal. Midline structures intact. Vascular: Major intracranial vascular flow voids are maintained. Skull and upper cervical spine: Craniocervical junction within normal limits. Abnormal T1 hypointensity seen involving the C2 vertebral body, of uncertain significance (series 6, image 15). Bone marrow signal intensity otherwise grossly normal. Scalp soft tissues within normal limits. Sinuses/Orbits: Globes and orbital soft tissues demonstrate no acute abnormality postsurgical changes noted at the right globe. Left paranasal sinusitis noted. Bilateral mastoid effusions. Nasogastric tube in place. Other: None. IMPRESSION: 1. 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 2. Left paranasal sinusitis with bilateral mastoid effusions. 3. Apparent abnormal T1 hypointensity involving the C2 vertebral body, of uncertain etiology or significance, but could reflect sequelae of infection, metastasis, or other marrow infiltrative process. Clinical correlation recommended. Additionally, finding could be further assessed with dedicated MRI of the cervical spine as clinically warranted. Electronically Signed   By: Jeannine Boga M.D.   On: 11/15/2017 16:51   Dg Chest Port 1 View  Result Date: 11/16/2017 CLINICAL DATA:  Hypoxia EXAM: PORTABLE CHEST 1 VIEW COMPARISON:  November 15, 2017 FINDINGS: Endotracheal tube tip is 2.9 cm above the carina. Central catheter  tip is at the cavoatrial junction. Nasogastric tube tip and side port below the diaphragm. Pigtail catheter remains present on the right. There is a minimal lateral pneumothorax on the right. There remains loculated effusion on the right with patchy consolidation in portions of the right  middle and lower lobes. Left lung is clear. Heart is upper normal in size with pulmonary vascular within normal limits. No adenopathy. No bone lesions. IMPRESSION: Tube and catheter positions as described with minimal pneumothorax on the right laterally. Persistent loculated effusion on the right with areas of patchy consolidation in the right mid lower lung zones. Left lung clear. Stable cardiac silhouette. Electronically Signed   By: Lowella Grip III M.D.   On: 11/16/2017 07:07   Dg Chest Port 1 View  Result Date: 11/15/2017 CLINICAL DATA:  Patient admitted 11/13/2017 for pneumonia and alcohol withdrawal. EXAM: PORTABLE CHEST 1 VIEW COMPARISON:  Single-view of the chest 11/14/2017, 11/13/2017 and 11/12/2017. CT chest 11/11/2017. FINDINGS: Support tubes and lines including a pigtail catheter in the right chest are unchanged. Right pleural effusion has decreased since yesterday's study. Extensive airspace disease in the right chest persists. The left lung appears clear. No pneumothorax. Heart size is normal. There is some subcutaneous emphysema over the right chest. IMPRESSION: Support tubes and lines are unchanged.  Negative for pneumothorax. Right pleural effusion is decreased since yesterday's examination. No change in extensive right side airspace disease. Electronically Signed   By: Inge Rise M.D.   On: 11/15/2017 07:45     Medications:   .  ceFAZolin (ANCEF) IV Stopped (11/16/17 0622)  . dextrose 50 mL/hr at 11/16/17 1053  . small volume/piggyback builder Stopped (11/16/17 1000)  . propofol (DIPRIVAN) infusion Stopped (11/15/17 0830)  . pureflow 2,500 mL/hr at 11/15/17 0256   . alteplase (TPA) for intrapleural administration  10 mg Intrapleural Q12H   And  . pulmozyme (DORNASE) for intrapleural administration  5 mg Intrapleural Q12H  . chlorhexidine gluconate (MEDLINE KIT)  15 mL Mouth Rinse BID  . feeding supplement (PRO-STAT SUGAR FREE 64)  30 mL Per Tube TID  . feeding  supplement (VITAL 1.5 CAL)  1,000 mL Per Tube Q24H  . folic acid  1 mg Per Tube Daily  . heparin injection (subcutaneous)  5,000 Units Subcutaneous Q8H  . insulin aspart  0-9 Units Subcutaneous Q4H  . lactulose  30 g Per Tube BID  . mouth rinse  15 mL Mouth Rinse 10 times per day  . multivitamin  15 mL Per Tube Daily  . pantoprazole sodium  40 mg Per Tube Daily  . rifaximin  550 mg Per Tube BID  . senna-docusate  2 tablet Per Tube BID  . thiamine  100 mg Per Tube Daily   acetaminophen, bisacodyl, fentaNYL (SUBLIMAZE) injection, heparin, hydrALAZINE, ipratropium-albuterol, LORazepam, metoprolol tartrate, ondansetron (ZOFRAN) IV, sennosides  Assessment/ Plan:  62 y.o.black male with a PMHx of osteoarthritis, hypertension, GERD who was admitted to [facility] on 11/05/2017 for angioedema.  Patient was previously on lisinopril.   1.  Acute renal failure, secondary to hypotension. Creatinine on admission of 0.99, normal GFR.  Oliguric. Requiring hemodialysis. CRRT  2.  Acute respiratory failure: requiring mechanical ventilation. With MSSA pneumonia and hydropneumothorax/empyema status post chest tube on 4/18  3.  Angioedema: secondary to ACE-I. Added to allergy list.  4.  Hypotension: requring vasopressors. Phenylephrine.   5. Altered Mental Status: status post CVA. With myclonic movements.  - Appreciate neurology input.   Overall prognosis critical. Discussed case with  neurology, Dr. Doy Mince, and pulmonary/critical care, Dr. Alva Garnet    LOS: [address] Nylan Nakatani 4/23/201911:14 AM

## 2017-11-16 NOTE — Progress Notes (Signed)
   11/16/17 1430  Clinical Encounter Type  Visited With Patient  Visit Type Follow-up;Patient actively dying  Referral From Nurse  Consult/Referral To Chaplain  Spiritual Encounters  Spiritual Needs Prayer   CH received an OR for end of life. CH reported to PT's RM and prayed silently as PT now appears to be unresponsive. CH will continue to follow up.

## 2017-11-17 LAB — BLOOD GAS, ARTERIAL
ACID-BASE DEFICIT: 2.6 mmol/L — AB (ref 0.0–2.0)
Bicarbonate: 25.4 mmol/L (ref 20.0–28.0)
FIO2: 0.75
MECHVT: 500 mL
O2 Saturation: 96.7 %
PEEP/CPAP: 8 cmH2O
PO2 ART: 101 mmHg (ref 83.0–108.0)
Patient temperature: 37
RATE: 24 resp/min
pCO2 arterial: 58 mmHg — ABNORMAL HIGH (ref 32.0–48.0)
pH, Arterial: 7.25 — ABNORMAL LOW (ref 7.350–7.450)

## 2017-11-17 LAB — AEROBIC/ANAEROBIC CULTURE (SURGICAL/DEEP WOUND): SPECIAL REQUESTS: NORMAL

## 2017-11-17 LAB — AEROBIC/ANAEROBIC CULTURE W GRAM STAIN (SURGICAL/DEEP WOUND)

## 2017-11-24 NOTE — Progress Notes (Addendum)
Patient pronounced dead at [id number] by Clinical research associatewriter and Wonda AmisB Mansfield, RN.  Dr Sung AmabileSimonds notified, patient's sister Ervin KnackHelen Johnson notified, she will come to the hospital to spend time with patient before he is transferred to the morgue.  CDS also notified and CCMD called. AC Jackie notified and funeral home form completed by Clinical research associatewriter.   All lines and drains removed and monitor turned off in preparation for family

## 2017-11-24 NOTE — Discharge Summary (Signed)
DEATH SUMMARY  DATE OF ADMISSION:  01-16-18  DATE OF DISCHARGE/DEATH:    ADMISSION DIAGNOSES:   Angioedema  Acute respiratory failure HTN (hypertension   DISCHARGE DIAGNOSES:      PRESENTATION:   Pt was admitted with the following HPI and the above admission diagnoses:  Dustin Orozco  is a 62 y.o. male who presents with lip and tongue swelling.  Patient stated to ED physician that he has been on lisinopril for a number of years.  His edema progressed rather rapidly and he had to be taken to the OR for nasal intubation by ENT.  Hospitalist were called for admission    HOSPITAL COURSE:     MAJOR EVENTS/TEST RESULTS: 04/07 Admitted: [age] M on chronic ACE inhibitor therapy, nasally intubated intubated by ENT in ED for angioedema with severe macroglossia and lip swelling. 04/08 developed anuric renal failure 04/08 Renal US: no hydro, bladder decompressed 04/09 Renal consultation: "Acute renal failure is likely secondary to severe ATN likely from hypotension.Renal ultrasound is negative for obstruction" 04/10 Extubated. Tolerated well but requiring [place] O2 @ 6 LPM 04/11 tolerating extubation well.  However, increasing agitated delirium.  Wife reports heavy chronic alcohol use.  Lorazepam initiated.  Persistent agitation.  Dexmedetomidine infusion initiated. 04/12 BIPAP initiated last night 04/14 Re-intubated for worsening hypoxemia and RLL infiltrate with loculated R eff 04/17 remains delerious and intubated  04/18 CT chest: Extensive right hydropneumothorax with severe compressive atelectasis of the right lung 04/18 R pleural catheter placed with 1.8 liters purulent drainage 04/18 CT head: Hypodensity within the left parafalcine parietal lobe, concerning for acute to subacute infarct. Age-indeterminate lacunar infarct in the genu of the left internal capsule 04/18 Neurology consultation 04/18 CRRT initiated 04/20 Patients status was tenuous yesterday. Required central line placement.  Also dialysis catheter stopped working, was replaced. Had some twitching activity noted. Given Keppra, Ativan and started on a Versed infusion. 04/21 continued to have some myoclonic activity. Versed was increased and EEG is pending. Keppra was increased. Still on CRRT 04/22 remains comatose.  Sedation held.  Mild clonus.  Remains on vasopressors.  Anuric.  Remains on CRRT.  04/22 EEG: attenuated, slow background that has a burst suppression character, that at times is isolated to the left hemisphere.  04/22 MRI brain: Acute to early subacute ischemic nonhemorrhagic infarct involving the left parieto-occipital region, with additional multifocal scattered ischemic infarcts involving the bilateral cerebral and right cerebellar hemispheres as above 04/23 extended discussion with family which included CCM MD and neurologist.  Findings of MRI conveyed to the family.  Poor prognosis for functional recovery conveyed as well.  Family opted for terminal extubation and comfort measures only. 04/24 Pronounced 0744  INDWELLING DEVICES:: L IJ HD cath 04/18 >> 04/19 ETT (L naris) 04/07 >> 04/10 ETT (oral) 04/14 >> 04/23 R fem trialysis cath 4/19 >>  R Pleural catheter 4/18 >>   MICRO DATA: MRSA PCR 04/07 >> NEG Resp 04/15 >> MSSA Urine 04/17 >> coag negative staph Resp 04/17 >> MSSA Blood 04/17 >> NEG Pleural 04/18 >> group C strep, MSSA  ANTIMICROBIALS: Cefepime 04/14 > >04/16 Amp-sulbactam 04/16 >> 04/17 Cefazolin 04/17 >> 04/23    Cause of death:  Severe sepsis/septic shock with MODS Polymicrobial pneumonia and empyema  Contributing factors: Alcohol withdrawal syndrome Angioedema Acute encephalopathy  Multifocal ischemic CVAs  Autopsy: No  Smoking: No  Billy Fischeravid Simonds, MD PCCM service Mobile [phone] Pager [phone] 11/22/2017 8:55 AM

## 2017-11-24 NOTE — Progress Notes (Signed)
Contacted Ebony Hailick Edinger, Medical Examiner, per Mr. Kelli Hopedinger, not ME case

## 2017-11-24 DEATH — deceased

## 2017-11-29 ENCOUNTER — Telehealth: Payer: Self-pay | Admitting: Pulmonary Disease

## 2017-11-29 NOTE — Telephone Encounter (Signed)
[employer] dropped off death certificate to be completed and signed Placed in nurse box

## 2017-11-30 NOTE — Telephone Encounter (Signed)
Funeral home notified death certificate ready for pick up. 

## 2017-11-30 NOTE — Telephone Encounter (Signed)
Death certificate placed in MD folder for completion. 

## 2018-12-03 IMAGING — DX DG CHEST 1V PORT
1 series · 1 of 1 positions shown · non-contrast
Comparison: November 11, 2017 chest radiograph and chest CT

CLINICAL DATA: Respiratory failure

EXAM:
PORTABLE CHEST 1 VIEW

[chest ap]
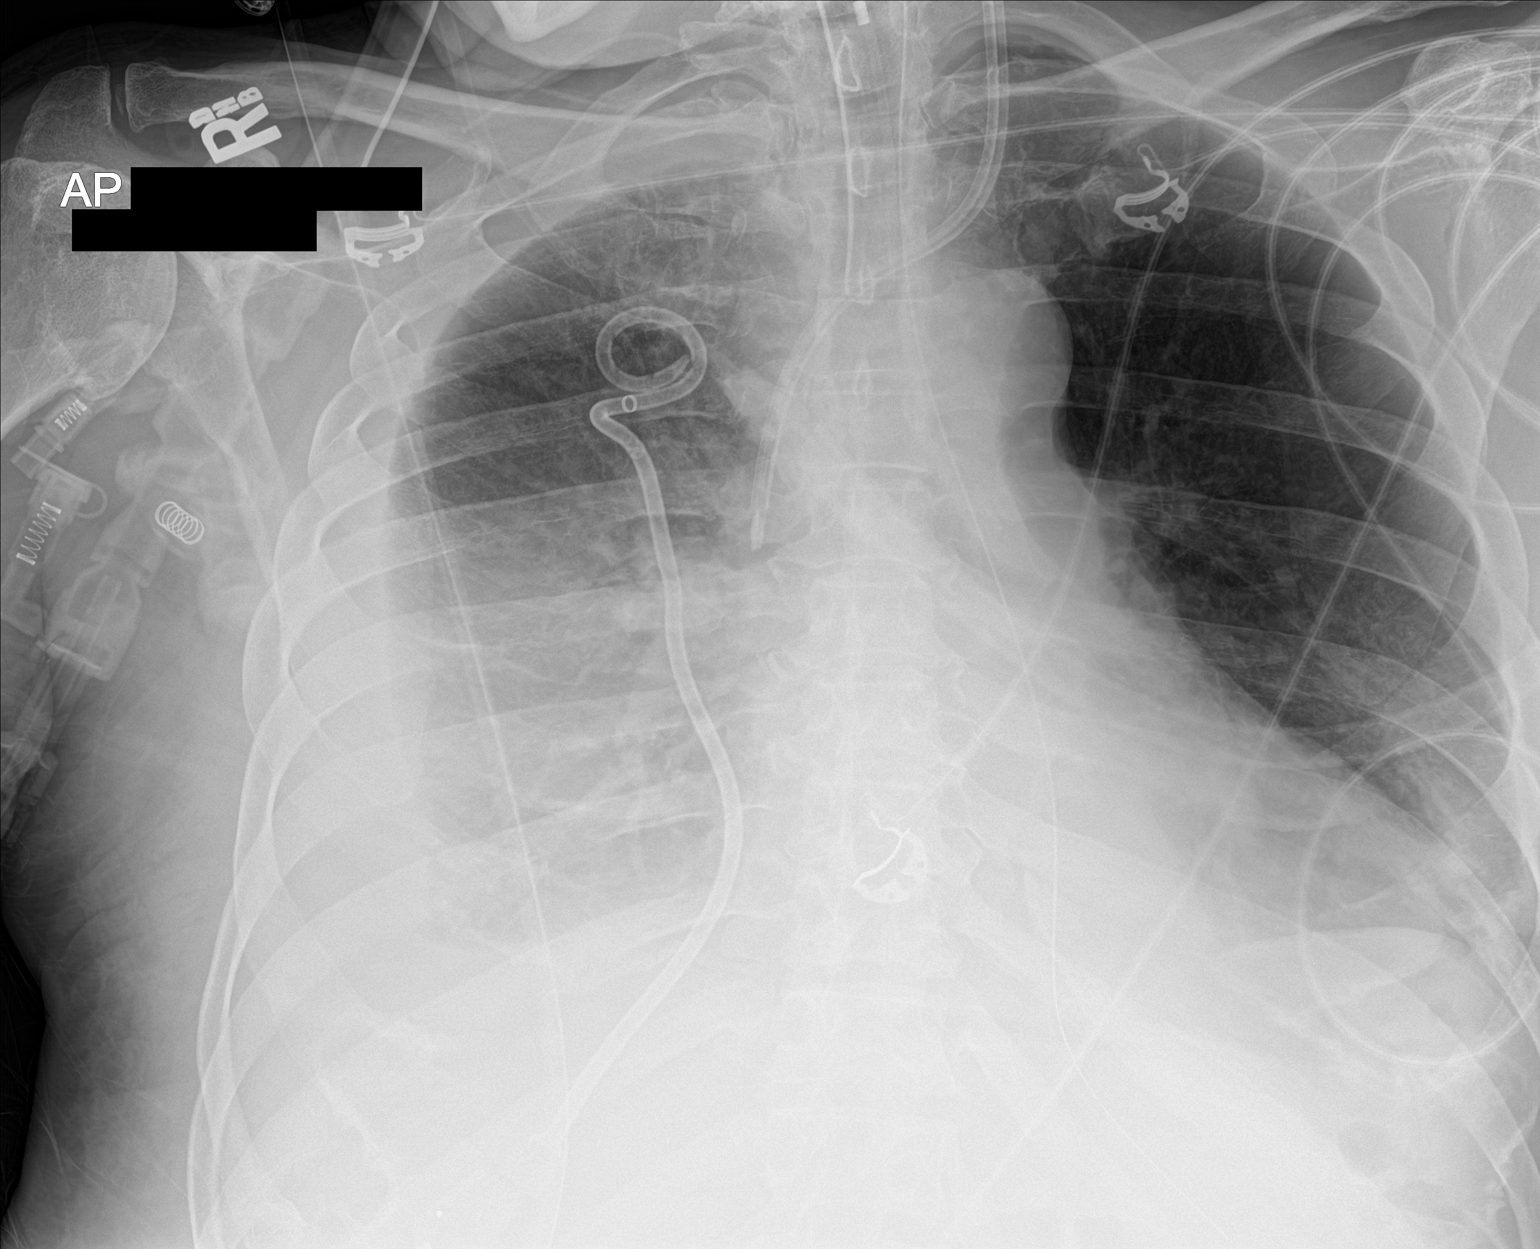

[1 of 1 positions shown; findings below may reference images not displayed]

FINDINGS: Endotracheal tube tip is 4.8 cm above the carina. Central catheter
tip is in superior vena cava. Nasogastric tube tip and side port are
below the diaphragm. There is now a pigtail catheter in the right
hemithorax. There has been significant drainage of right pleural
effusion compared to 1 day prior. Moderate loculated effusion
remains on the right. There is atelectatic change throughout the
right mid and lower lung zones. There is focal consolidation in the
medial left base with minimal left pleural effusion. Heart is upper
normal in size with pulmonary vascularity within normal limits. No
adenopathy. No bone lesions.
IMPRESSION: Tube and catheter positions as described without pneumothorax.
Pigtail catheter now present on the right with considerable
resolution of pleural effusion on the right. Moderate partially
loculated pleural effusion remains on the right. There is a minimal
pleural effusion on the left. There is atelectatic change throughout
right mid lower lung zones. Stable consolidation medial left base.
Stable cardiac silhouette.

## 2018-12-04 IMAGING — DX DG CHEST 1V PORT
1 series · 1 of 1 positions shown · non-contrast
Comparison: Yesterday.

CLINICAL DATA: Acute respiratory failure.

EXAM:
PORTABLE CHEST 1 VIEW

[chest ap]
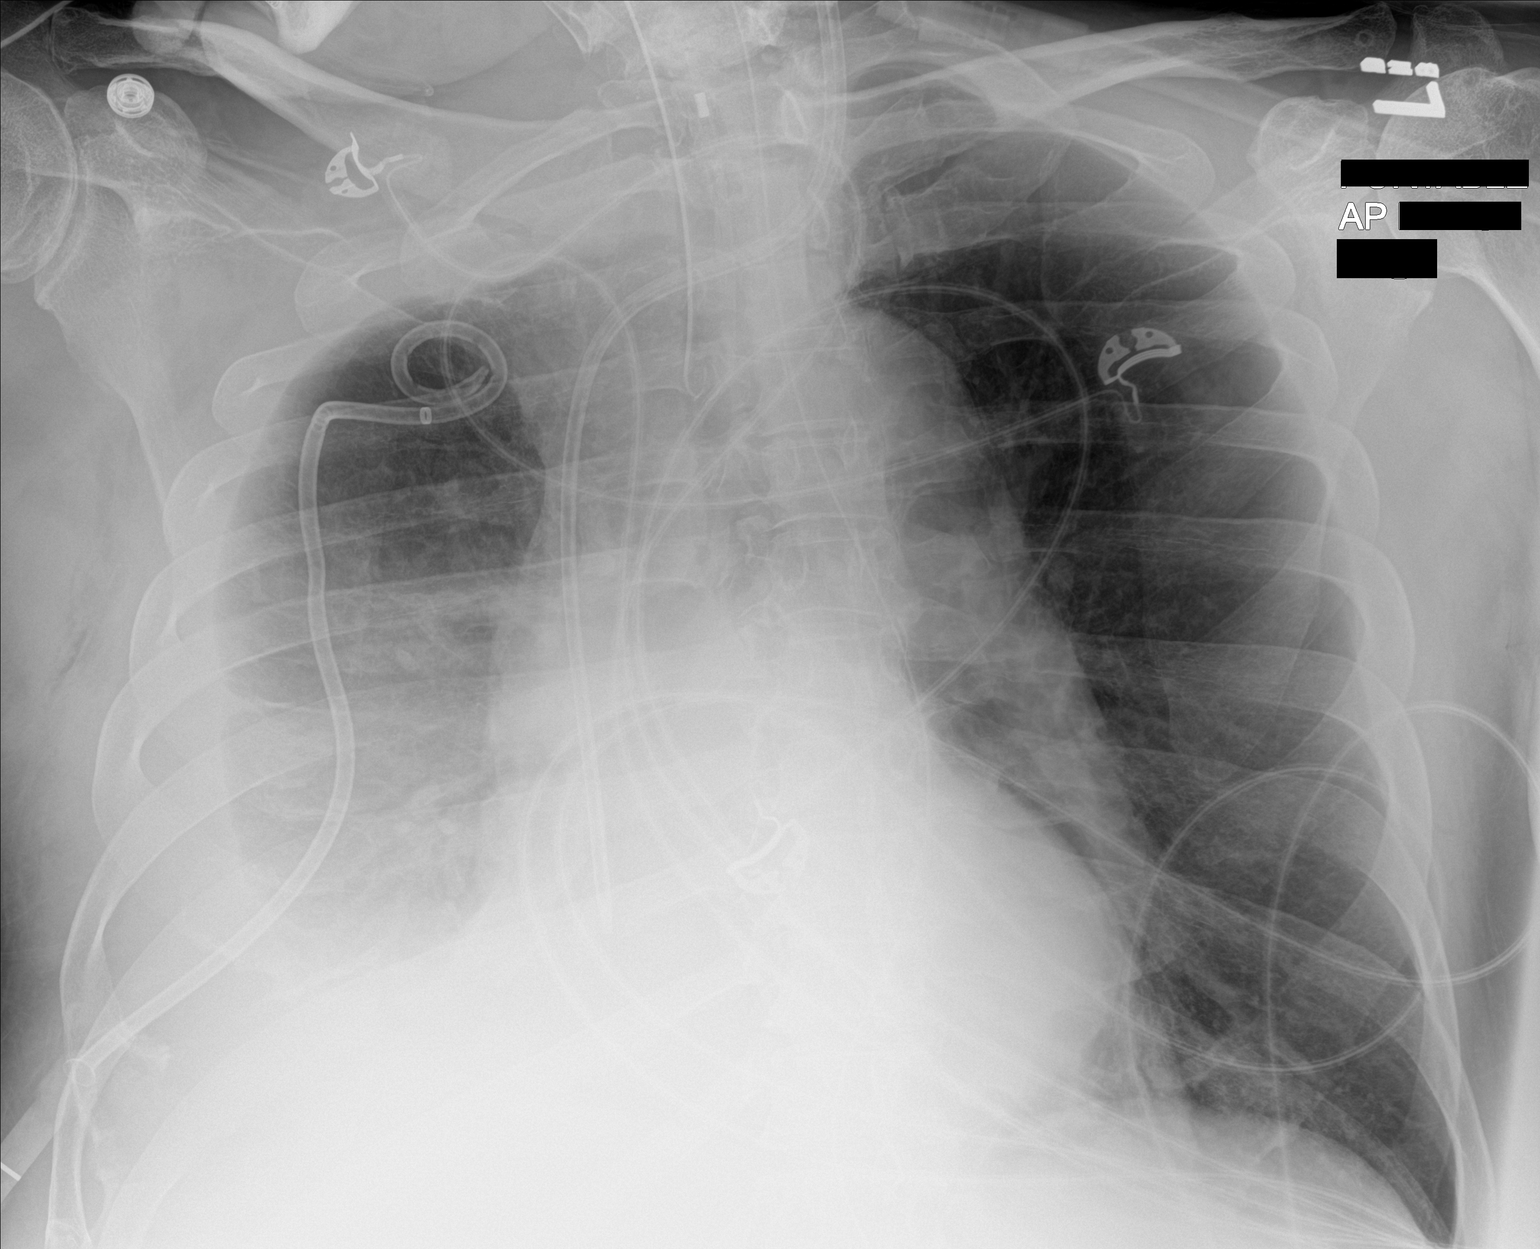

[1 of 1 positions shown; findings below may reference images not displayed]

FINDINGS: The endotracheal tube remains in satisfactory position. Nasogastric
tube extending into the stomach. Left jugular catheter tip in the
right atrium.

Normal sized heart. Stable right pigtail pleural catheter.
Moderately large right pleural effusion with increased fluid and
resolution of previously demonstrated pleural air. Small amount of
subcutaneous emphysema on the right. There is also ill-defined
opacity in the right mid and lower lung zone without significant
change. Clear left lung. Unremarkable bones.
IMPRESSION: 1. Moderately large right pleural effusion with increased fluid and
resolved pleural air.
2. Improving atelectasis in the right mid and lower lung zone.

## 2018-12-05 IMAGING — DX DG CHEST 1V PORT
1 series · 1 of 1 positions shown · non-contrast
Comparison: 11/13/2017

CLINICAL DATA: Acute respiratory failure.

EXAM:
PORTABLE CHEST 1 VIEW

[chest ap]
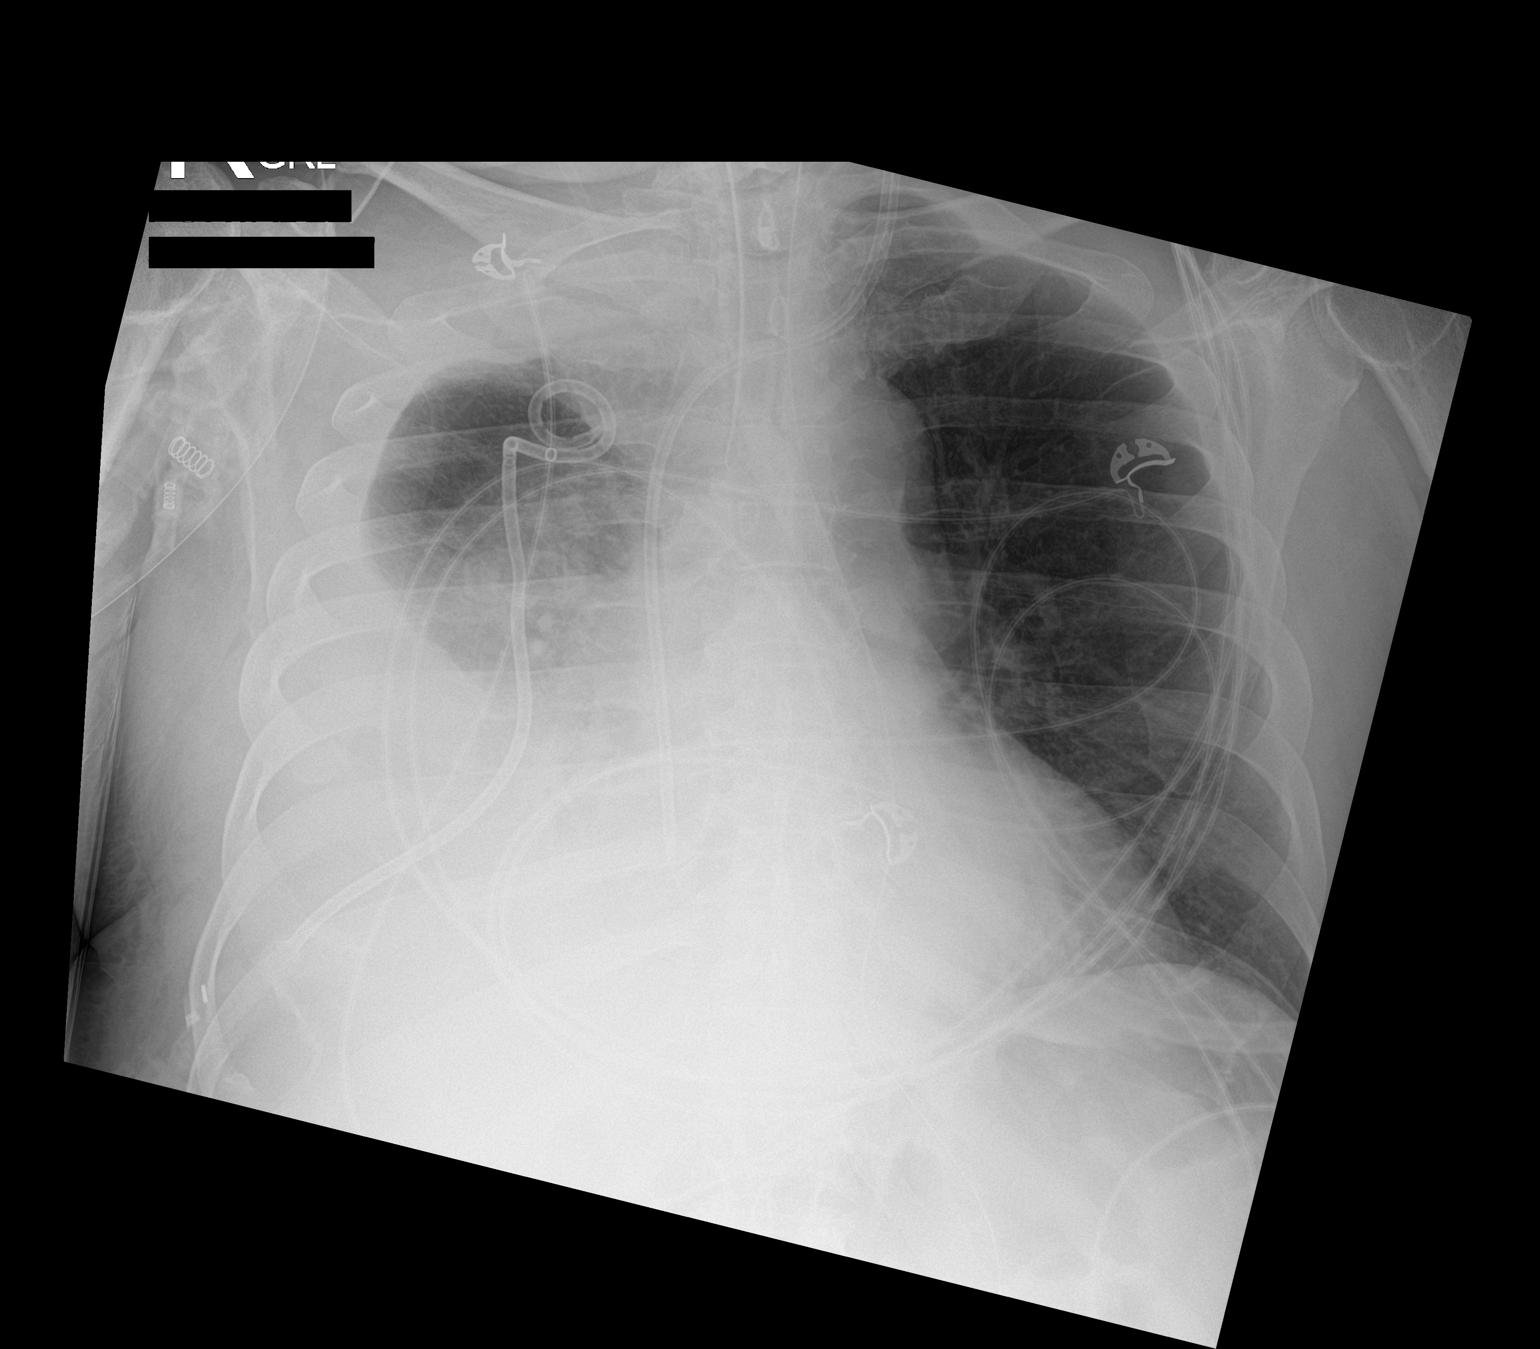

[1 of 1 positions shown; findings below may reference images not displayed]

FINDINGS: Endotracheal tube terminates 3 cm above the carina. Left jugular
catheter terminates over the high right atrium. Enteric tube courses
into the left upper abdomen with tip not imaged. Right pigtail
pleural catheter remains in place. A large, partially loculated
right pleural effusion has further increased in size with worsening
right lung aeration, particularly in the base. The left lung remains
grossly clear. The left costophrenic angle was incompletely imaged.
No pneumothorax is identified.
IMPRESSION: Increasing size of large right pleural effusion with worsening right
lung aeration.

## 2018-12-06 IMAGING — DX DG ORBITS FOR FOREIGN BODY
2 series · 2 of 2 positions shown · non-contrast
Comparison: None.

CLINICAL DATA: Metal working/exposure; clearance prior to MRI

EXAM:
ORBITS FOR FOREIGN BODY - 2 VIEW

[skull waters (1 of 2)]
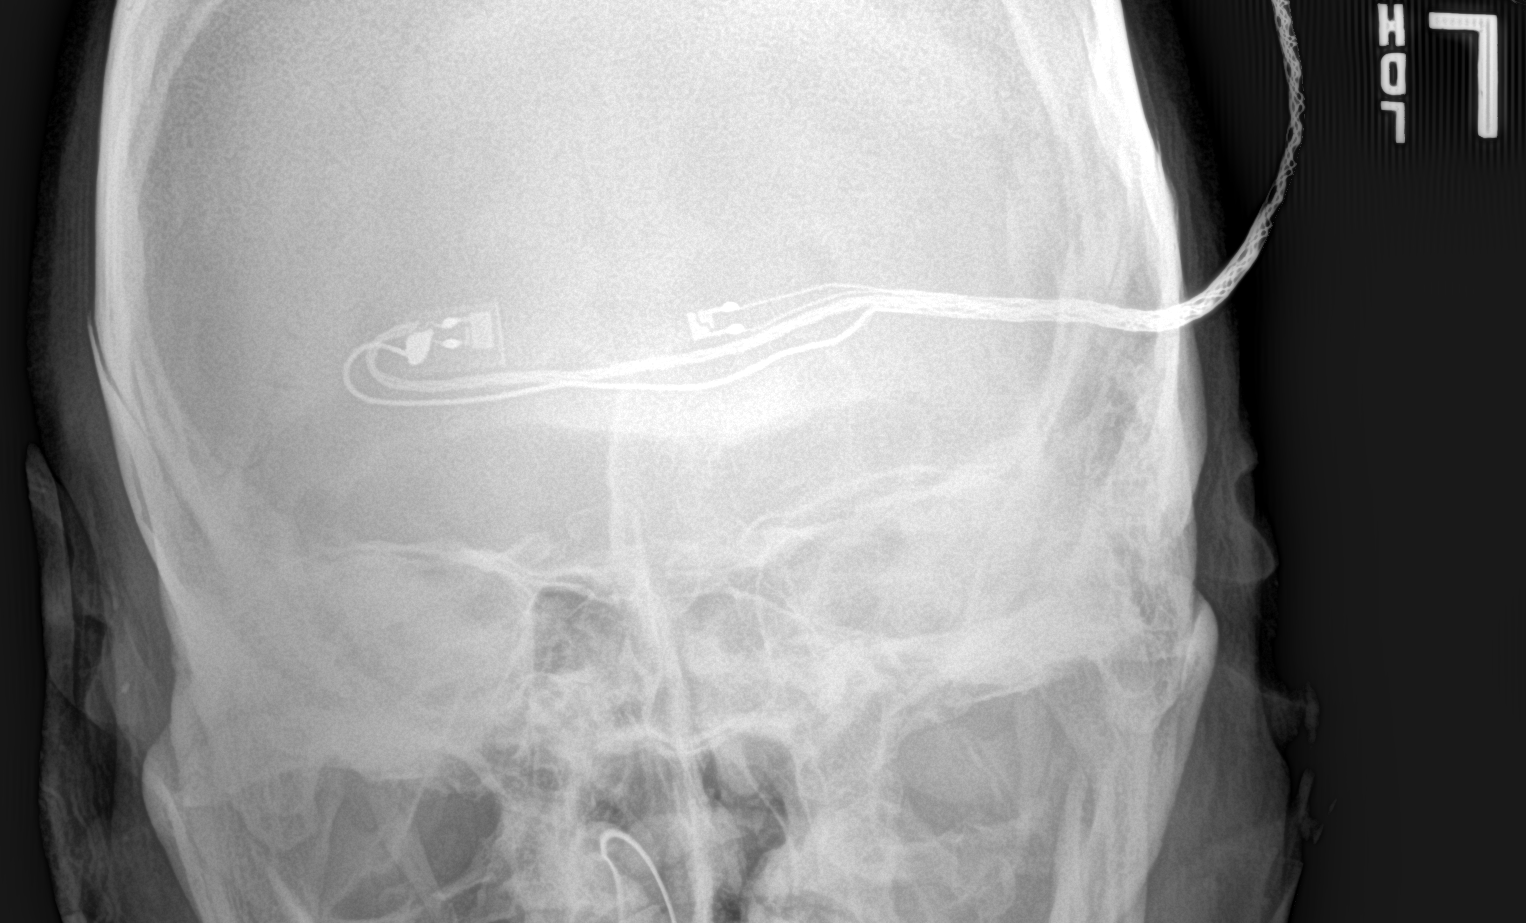

[skull waters (2 of 2)]
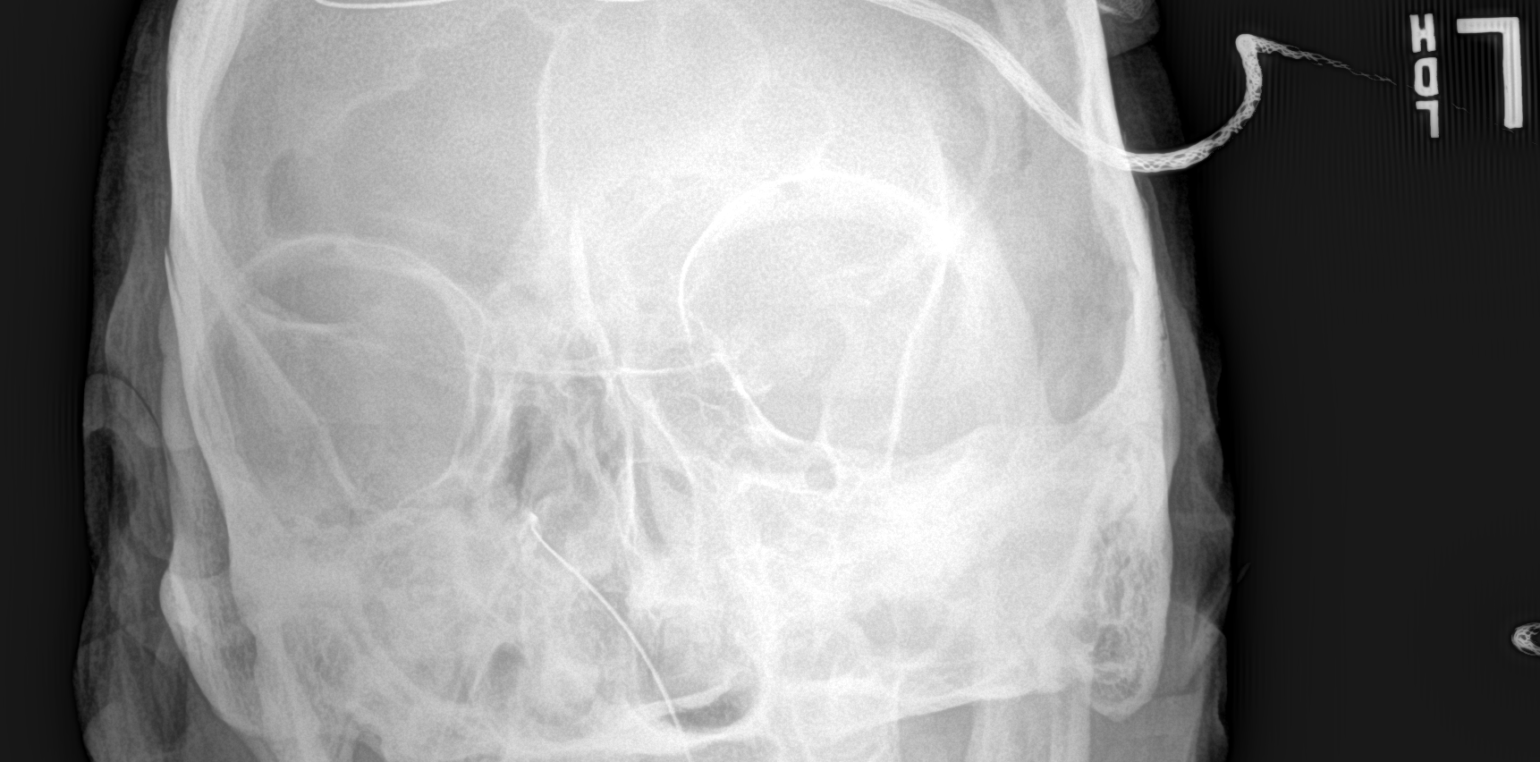

[2 of 2 positions shown; findings below may reference images not displayed]

FINDINGS: There is no evidence of metallic foreign body within the orbits. No
significant bone abnormality identified.
IMPRESSION: No evidence of metallic foreign body within the orbits.
